# Patient Record
Sex: Male | Born: 1950 | Race: White | Hispanic: No | Marital: Married | Smoking: Current every day smoker
Health system: Southern US, Community
[De-identification: ages and names within clinical notes are randomized; demographics above are authoritative.]

## PROBLEM LIST (undated history)

## (undated) DIAGNOSIS — Z9289 Personal history of other medical treatment: Secondary | ICD-10-CM

## (undated) DIAGNOSIS — I309 Acute pericarditis, unspecified: Secondary | ICD-10-CM

## (undated) DIAGNOSIS — I2699 Other pulmonary embolism without acute cor pulmonale: Secondary | ICD-10-CM

## (undated) DIAGNOSIS — I200 Unstable angina: Secondary | ICD-10-CM

## (undated) DIAGNOSIS — I219 Acute myocardial infarction, unspecified: Secondary | ICD-10-CM

## (undated) DIAGNOSIS — M549 Dorsalgia, unspecified: Secondary | ICD-10-CM

## (undated) DIAGNOSIS — Z720 Tobacco use: Secondary | ICD-10-CM

## (undated) DIAGNOSIS — F32A Depression, unspecified: Secondary | ICD-10-CM

## (undated) DIAGNOSIS — F329 Major depressive disorder, single episode, unspecified: Secondary | ICD-10-CM

## (undated) DIAGNOSIS — R0602 Shortness of breath: Secondary | ICD-10-CM

## (undated) DIAGNOSIS — I10 Essential (primary) hypertension: Secondary | ICD-10-CM

## (undated) DIAGNOSIS — J449 Chronic obstructive pulmonary disease, unspecified: Secondary | ICD-10-CM

## (undated) DIAGNOSIS — E785 Hyperlipidemia, unspecified: Secondary | ICD-10-CM

## (undated) DIAGNOSIS — I2510 Atherosclerotic heart disease of native coronary artery without angina pectoris: Secondary | ICD-10-CM

## (undated) DIAGNOSIS — E119 Type 2 diabetes mellitus without complications: Secondary | ICD-10-CM

## (undated) HISTORY — DX: Other pulmonary embolism without acute cor pulmonale: I26.99

## (undated) HISTORY — PX: CARDIAC CATHETERIZATION: SHX172

## (undated) HISTORY — DX: Personal history of other medical treatment: Z92.89

## (undated) HISTORY — DX: Unstable angina: I20.0

## (undated) HISTORY — PX: CHOLECYSTECTOMY: SHX55

## (undated) HISTORY — DX: Acute pericarditis, unspecified: I30.9

---

## 1982-08-29 HISTORY — PX: KNEE SURGERY: SHX244

## 1998-03-26 ENCOUNTER — Ambulatory Visit (HOSPITAL_BASED_OUTPATIENT_CLINIC_OR_DEPARTMENT_OTHER): Admission: RE | Admit: 1998-03-26 | Discharge: 1998-03-26 | Payer: Self-pay | Admitting: Orthopedic Surgery

## 1998-04-22 ENCOUNTER — Encounter: Admission: RE | Admit: 1998-04-22 | Discharge: 1998-05-19 | Payer: Self-pay | Admitting: Anesthesiology

## 1998-04-27 ENCOUNTER — Ambulatory Visit (HOSPITAL_COMMUNITY): Admission: RE | Admit: 1998-04-27 | Discharge: 1998-04-27 | Payer: Self-pay | Admitting: Orthopedic Surgery

## 1998-08-13 ENCOUNTER — Encounter: Payer: Self-pay | Admitting: Cardiology

## 1998-08-13 ENCOUNTER — Ambulatory Visit (HOSPITAL_COMMUNITY): Admission: RE | Admit: 1998-08-13 | Discharge: 1998-08-13 | Payer: Self-pay | Admitting: Cardiology

## 1998-08-19 ENCOUNTER — Observation Stay (HOSPITAL_COMMUNITY): Admission: AD | Admit: 1998-08-19 | Discharge: 1998-08-20 | Payer: Self-pay | Admitting: Cardiology

## 1998-10-12 ENCOUNTER — Observation Stay (HOSPITAL_COMMUNITY): Admission: AD | Admit: 1998-10-12 | Discharge: 1998-10-13 | Payer: Self-pay | Admitting: Cardiology

## 1999-01-12 ENCOUNTER — Ambulatory Visit (HOSPITAL_BASED_OUTPATIENT_CLINIC_OR_DEPARTMENT_OTHER): Admission: RE | Admit: 1999-01-12 | Discharge: 1999-01-12 | Payer: Self-pay | Admitting: Orthopedic Surgery

## 1999-01-19 ENCOUNTER — Ambulatory Visit (HOSPITAL_COMMUNITY): Admission: RE | Admit: 1999-01-19 | Discharge: 1999-01-19 | Payer: Self-pay | Admitting: Cardiology

## 1999-01-22 ENCOUNTER — Ambulatory Visit: Admission: RE | Admit: 1999-01-22 | Discharge: 1999-01-22 | Payer: Self-pay | Admitting: Orthopedic Surgery

## 1999-02-09 ENCOUNTER — Encounter: Payer: Self-pay | Admitting: Pulmonary Disease

## 1999-02-17 ENCOUNTER — Encounter: Payer: Self-pay | Admitting: Pulmonary Disease

## 1999-02-17 ENCOUNTER — Ambulatory Visit: Admission: RE | Admit: 1999-02-17 | Discharge: 1999-02-17 | Payer: Self-pay | Admitting: Pulmonary Disease

## 1999-07-09 ENCOUNTER — Encounter: Admission: RE | Admit: 1999-07-09 | Discharge: 1999-07-09 | Payer: Self-pay | Admitting: Cardiology

## 1999-07-09 ENCOUNTER — Encounter: Payer: Self-pay | Admitting: Cardiology

## 1999-07-16 ENCOUNTER — Ambulatory Visit (HOSPITAL_COMMUNITY): Admission: RE | Admit: 1999-07-16 | Discharge: 1999-07-17 | Payer: Self-pay | Admitting: Cardiology

## 1999-08-30 HISTORY — PX: CORONARY ANGIOPLASTY: SHX604

## 2000-08-07 ENCOUNTER — Inpatient Hospital Stay (HOSPITAL_COMMUNITY): Admission: AD | Admit: 2000-08-07 | Discharge: 2000-08-09 | Payer: Self-pay | Admitting: Cardiology

## 2000-08-07 ENCOUNTER — Encounter: Payer: Self-pay | Admitting: Cardiology

## 2000-09-19 ENCOUNTER — Inpatient Hospital Stay (HOSPITAL_COMMUNITY): Admission: RE | Admit: 2000-09-19 | Discharge: 2000-09-20 | Payer: Self-pay | Admitting: Urology

## 2001-09-05 ENCOUNTER — Encounter: Payer: Self-pay | Admitting: Pulmonary Disease

## 2001-12-19 ENCOUNTER — Encounter: Payer: Self-pay | Admitting: Gastroenterology

## 2002-08-29 HISTORY — PX: PERCUTANEOUS CORONARY STENT INTERVENTION (PCI-S): SHX6016

## 2003-06-24 ENCOUNTER — Ambulatory Visit (HOSPITAL_COMMUNITY): Admission: RE | Admit: 2003-06-24 | Discharge: 2003-06-25 | Payer: Self-pay | Admitting: Cardiology

## 2004-09-01 ENCOUNTER — Ambulatory Visit: Payer: Self-pay | Admitting: Family Medicine

## 2004-09-03 ENCOUNTER — Ambulatory Visit: Payer: Self-pay | Admitting: Family Medicine

## 2005-05-03 ENCOUNTER — Ambulatory Visit: Payer: Self-pay | Admitting: Family Medicine

## 2005-05-16 ENCOUNTER — Ambulatory Visit: Payer: Self-pay | Admitting: Family Medicine

## 2005-07-07 ENCOUNTER — Ambulatory Visit: Payer: Self-pay | Admitting: Family Medicine

## 2008-02-08 DIAGNOSIS — M1990 Unspecified osteoarthritis, unspecified site: Secondary | ICD-10-CM | POA: Insufficient documentation

## 2008-02-08 DIAGNOSIS — G43909 Migraine, unspecified, not intractable, without status migrainosus: Secondary | ICD-10-CM | POA: Insufficient documentation

## 2008-02-08 DIAGNOSIS — Z860100 Personal history of colon polyps, unspecified: Secondary | ICD-10-CM | POA: Insufficient documentation

## 2008-02-08 DIAGNOSIS — M549 Dorsalgia, unspecified: Secondary | ICD-10-CM

## 2008-02-08 DIAGNOSIS — E785 Hyperlipidemia, unspecified: Secondary | ICD-10-CM

## 2008-02-08 DIAGNOSIS — I10 Essential (primary) hypertension: Secondary | ICD-10-CM | POA: Insufficient documentation

## 2008-02-08 HISTORY — DX: Dorsalgia, unspecified: M54.9

## 2008-02-12 ENCOUNTER — Ambulatory Visit: Payer: Self-pay | Admitting: Gastroenterology

## 2008-02-12 DIAGNOSIS — Z794 Long term (current) use of insulin: Secondary | ICD-10-CM

## 2008-02-12 DIAGNOSIS — R1013 Epigastric pain: Secondary | ICD-10-CM

## 2008-02-12 DIAGNOSIS — E119 Type 2 diabetes mellitus without complications: Secondary | ICD-10-CM

## 2008-02-13 LAB — CONVERTED CEMR LAB
ALT: 78 units/L — ABNORMAL HIGH (ref 0–53)
AST: 63 units/L — ABNORMAL HIGH (ref 0–37)
Albumin: 3.5 g/dL (ref 3.5–5.2)
Alkaline Phosphatase: 115 units/L (ref 39–117)
Amylase: 32 units/L (ref 27–131)
Basophils Absolute: 0 10*3/uL (ref 0.0–0.1)
Basophils Relative: 0.5 % (ref 0.0–1.0)
Bilirubin, Direct: 0.1 mg/dL (ref 0.0–0.3)
Eosinophils Absolute: 0.1 10*3/uL (ref 0.0–0.7)
Eosinophils Relative: 2 % (ref 0.0–5.0)
HCT: 45.1 % (ref 39.0–52.0)
Hemoglobin: 15 g/dL (ref 13.0–17.0)
Lipase: 19 units/L (ref 11.0–59.0)
Lymphocytes Relative: 21.6 % (ref 12.0–46.0)
MCHC: 33.3 g/dL (ref 30.0–36.0)
MCV: 101.5 fL — ABNORMAL HIGH (ref 78.0–100.0)
Monocytes Absolute: 0.6 10*3/uL (ref 0.1–1.0)
Monocytes Relative: 9.1 % (ref 3.0–12.0)
Neutro Abs: 4.1 10*3/uL (ref 1.4–7.7)
Neutrophils Relative %: 66.8 % (ref 43.0–77.0)
Platelets: 156 10*3/uL (ref 150–400)
RBC: 4.44 M/uL (ref 4.22–5.81)
RDW: 13 % (ref 11.5–14.6)
Sed Rate: 16 mm/hr (ref 0–16)
Total Bilirubin: 0.5 mg/dL (ref 0.3–1.2)
Total Protein: 6.6 g/dL (ref 6.0–8.3)
WBC: 6.1 10*3/uL (ref 4.5–10.5)

## 2008-02-14 ENCOUNTER — Ambulatory Visit (HOSPITAL_COMMUNITY): Admission: RE | Admit: 2008-02-14 | Discharge: 2008-02-14 | Payer: Self-pay | Admitting: Gastroenterology

## 2008-02-20 ENCOUNTER — Ambulatory Visit: Payer: Self-pay | Admitting: Gastroenterology

## 2008-02-20 ENCOUNTER — Encounter: Payer: Self-pay | Admitting: Gastroenterology

## 2008-02-20 ENCOUNTER — Telehealth: Payer: Self-pay | Admitting: Gastroenterology

## 2008-02-20 ENCOUNTER — Encounter (INDEPENDENT_AMBULATORY_CARE_PROVIDER_SITE_OTHER): Payer: Self-pay | Admitting: *Deleted

## 2008-02-20 LAB — CONVERTED CEMR LAB
Ferritin: 389.6 ng/mL — ABNORMAL HIGH (ref 22.0–322.0)
Folate: 6.6 ng/mL
HCV Ab: NEGATIVE
Iron: 154 ug/dL (ref 42–165)
Saturation Ratios: 40.2 % (ref 20.0–50.0)
Transferrin: 273.6 mg/dL (ref >=212.0)
Vitamin B-12: 418 pg/mL (ref 211–911)

## 2008-03-10 ENCOUNTER — Encounter: Payer: Self-pay | Admitting: Gastroenterology

## 2008-03-18 ENCOUNTER — Ambulatory Visit: Payer: Self-pay | Admitting: Gastroenterology

## 2008-03-18 DIAGNOSIS — K7689 Other specified diseases of liver: Secondary | ICD-10-CM

## 2008-03-18 DIAGNOSIS — K219 Gastro-esophageal reflux disease without esophagitis: Secondary | ICD-10-CM | POA: Insufficient documentation

## 2008-04-02 ENCOUNTER — Telehealth: Payer: Self-pay | Admitting: Gastroenterology

## 2008-04-09 ENCOUNTER — Encounter (INDEPENDENT_AMBULATORY_CARE_PROVIDER_SITE_OTHER): Payer: Self-pay | Admitting: *Deleted

## 2008-04-29 ENCOUNTER — Telehealth: Payer: Self-pay | Admitting: Gastroenterology

## 2008-05-22 ENCOUNTER — Ambulatory Visit: Payer: Self-pay | Admitting: Gastroenterology

## 2008-05-22 LAB — CONVERTED CEMR LAB
ALT: 47 units/L (ref 0–53)
AST: 47 units/L — ABNORMAL HIGH (ref 0–37)
Albumin: 3.9 g/dL (ref 3.5–5.2)
Alkaline Phosphatase: 101 units/L (ref 39–117)
Bilirubin, Direct: 0.2 mg/dL (ref 0.0–0.3)
Total Bilirubin: 0.8 mg/dL (ref 0.3–1.2)
Total Protein: 7.1 g/dL (ref 6.0–8.3)

## 2008-05-29 ENCOUNTER — Telehealth: Payer: Self-pay | Admitting: Gastroenterology

## 2008-06-18 ENCOUNTER — Ambulatory Visit: Payer: Self-pay | Admitting: Gastroenterology

## 2008-07-08 ENCOUNTER — Telehealth: Payer: Self-pay | Admitting: Gastroenterology

## 2009-01-20 ENCOUNTER — Ambulatory Visit: Payer: Self-pay | Admitting: Gastroenterology

## 2009-01-20 ENCOUNTER — Encounter: Payer: Self-pay | Admitting: Gastroenterology

## 2009-01-20 DIAGNOSIS — K625 Hemorrhage of anus and rectum: Secondary | ICD-10-CM

## 2009-01-20 DIAGNOSIS — K644 Residual hemorrhoidal skin tags: Secondary | ICD-10-CM | POA: Insufficient documentation

## 2009-01-21 LAB — CONVERTED CEMR LAB
Albumin: 3.9 g/dL (ref 3.5–5.2)
Alkaline Phosphatase: 140 units/L — ABNORMAL HIGH (ref 39–117)

## 2009-01-29 ENCOUNTER — Ambulatory Visit (HOSPITAL_COMMUNITY): Admission: RE | Admit: 2009-01-29 | Discharge: 2009-01-29 | Payer: Self-pay | Admitting: Gastroenterology

## 2009-01-29 ENCOUNTER — Ambulatory Visit: Payer: Self-pay | Admitting: Gastroenterology

## 2009-01-29 ENCOUNTER — Encounter: Payer: Self-pay | Admitting: Gastroenterology

## 2009-01-30 ENCOUNTER — Encounter: Payer: Self-pay | Admitting: Gastroenterology

## 2009-08-06 ENCOUNTER — Ambulatory Visit: Payer: Self-pay | Admitting: Pulmonary Disease

## 2009-08-06 DIAGNOSIS — R0989 Other specified symptoms and signs involving the circulatory and respiratory systems: Secondary | ICD-10-CM

## 2009-08-06 DIAGNOSIS — J438 Other emphysema: Secondary | ICD-10-CM | POA: Insufficient documentation

## 2009-08-06 DIAGNOSIS — R0609 Other forms of dyspnea: Secondary | ICD-10-CM

## 2009-08-14 ENCOUNTER — Telehealth (INDEPENDENT_AMBULATORY_CARE_PROVIDER_SITE_OTHER): Payer: Self-pay | Admitting: *Deleted

## 2009-09-01 ENCOUNTER — Encounter: Payer: Self-pay | Admitting: Pulmonary Disease

## 2009-09-03 ENCOUNTER — Ambulatory Visit: Payer: Self-pay | Admitting: Pulmonary Disease

## 2009-09-28 ENCOUNTER — Encounter: Payer: Self-pay | Admitting: Pulmonary Disease

## 2010-05-05 ENCOUNTER — Encounter (INDEPENDENT_AMBULATORY_CARE_PROVIDER_SITE_OTHER): Payer: Self-pay | Admitting: *Deleted

## 2010-09-28 NOTE — Letter (Signed)
Summary: [facility]  [facility]   Imported By: Sherian Rein 09/15/2009 13:53:23  _____________________________________________________________________  External Attachment:    Type:   Image     Comment:   External Document

## 2010-09-28 NOTE — Miscellaneous (Signed)
Summary: Orders Update pft charges  Clinical Lists Changes  Orders: Added new Service order of Carbon Monoxide diffusing w/capacity (94720) - Signed Added new Service order of Lung Volumes (94240) - Signed Added new Service order of Spirometry (Pre & Post) (94060) - Signed 

## 2010-09-28 NOTE — Assessment & Plan Note (Signed)
Summary: rov for review of pfts.   Copy to:  Daphene Jaeger ([facility]) Primary Provider/Referring Provider:  Molly Maduro Dough,MD  CC:  Pt is here for a f/u appt to discuss PFT results.  Pt states he is still smoking approx 1/4 ppd.  Pt states he hasn't noticed much change in his breating since starting on Symbicort.  Pt denied a cough.  .  History of Present Illness: The pt comes in today for full pfts and to discuss results.  He has mild airflow obstruction primarily manifested by airtrapping, no response to bronchodilators, and mild decrease in dlco which corrects to normal with alveolar volume adjustment.  I have reviewed the study in depth with him, and answered all of his questions.  Current Medications (verified): 1)  Morphine Pump Implant 2)  Oxycontin 40 Mg  Tb12 (Oxycodone Hcl) .... Three Times A Day 3)  Celexa 40 Mg Tabs (Citalopram Hydrobromide) .Marland Kitchen.. 1 By Mouth Once Daily 4)  Lisinopril-Hydrochlorothiazide 20-12.5 Mg Tabs (Lisinopril-Hydrochlorothiazide) .... Take 1 Tablet By Mouth Once A Day 5)  Baby Aspirin 81 Mg  Chew (Aspirin) .... Once Daily 6)  Flexeril 10 Mg Tabs (Cyclobenzaprine Hcl) .Marland Kitchen.. 1 By Mouth Once Daily 7)  Zetia 10 Mg Tabs (Ezetimibe) .... Take 1 Tablet By Mouth Once A Day 8)  Glimepiride 4 Mg Tabs (Glimepiride) .... Take 1 1/2 Tabs ( Total of 6mg ) By Mouth Once Daily 9)  Insulin .... Take As Directed 10)  Spiriva Handihaler 18 Mcg  Caps (Tiotropium Bromide Monohydrate) .... One Puff in Handihaler Daily 11)  Symbicort 160-4.5 Mcg/act  Aero (Budesonide-Formoterol Fumarate) .... Two Puffs Twice Daily 12)  Proair Hfa 108 (90 Base) Mcg/act  Aers (Albuterol Sulfate) .... 2 Puffs Every 4-6 Hours As Needed 13)  Nitrostat 0.4 Mg Subl (Nitroglycerin) .... As Needed 14)  Metoprolol Succinate 25 Mg Xr24h-Tab (Metoprolol Succinate) .... Take 1 Tablet By Mouth Once A Day 15)  Imdur 30 Mg Xr24h-Tab (Isosorbide Mononitrate) .... Take 1 Tablet By Mouth Once A  Day  Allergies (verified): No Known Drug Allergies  Vital Signs:  Patient profile:   60 year old male Height:      68 inches Weight:      237 pounds BMI:     36.17 O2 Sat:      90 % on Room air Temp:     97.5 degrees F oral Pulse rate:   82 / minute BP sitting:   114 / 68  (right arm) Cuff size:   regular  Vitals Entered By: Arman Filter LPN (September 03, 2009 11:12 AM)  O2 Flow:  Room air CC: Pt is here for a f/u appt to discuss PFT results.  Pt states he is still smoking approx 1/4 ppd.  Pt states he hasn't noticed much change in his breating since starting on Symbicort.  Pt denied a cough.   Comments Medications reviewed with patient Arman Filter LPN  September 03, 2009 11:12 AM    Physical Exam  General:  obese male in nad   Impression & Recommendations:  Problem # 1:  EMPHYSEMA (ICD-492.8) the pt has mild disease at best by pfts.  I have explained to him the most appropriate treatment for this is total smoking cessation, along with as needed albuterol.  I have asked him to d/c the spiriva and symbicort, but can restart the symbicort if he is unable to stop smoking and begins to have worsening symptoms.  Problem # 2:  DYSPNEA ON  EXERTION (ICD-786.09) The pt's symptoms are out of proportion to his pft findings.  There is no queston his obesity and deconditioning are major contributors, but I cannot exclude a cardiac component or VTE disease.  He is currently scheduled to have a cardiac w/u with Dr. Tresa Endo, and I have asked him to contact me if this is unrevealing so that we can schedule v/q for completeness.  Time spent with pt today discussing the above was  Medications Added to Medication List This Visit: 1)  Nitrostat 0.4 Mg Subl (Nitroglycerin) .... As needed 2)  Metoprolol Succinate 25 Mg Xr24h-tab (Metoprolol succinate) .... Take 1 tablet by mouth once a day 3)  Imdur 30 Mg Xr24h-tab (Isosorbide mononitrate) .... Take 1 tablet by mouth once a day  Other  Orders: Est. Patient Level III (04540)  Patient Instructions: 1)  would stop spiriva and symbicort 2)  stay on albuterol inhaler 2 puffs every 6hrs if needed. 3)  the most important thing you can do is to quit smoking. 4)  If Dr. Tresa Endo doesn't find anything wrong with your heart, you need to let me know so that we can set up lung scan to exclude the possibility of blood clots.   Prescriptions: PROAIR HFA 108 (90 BASE) MCG/ACT  AERS (ALBUTEROL SULFATE) 2 puffs every 4-6 hours as needed  #3 x 0   Entered and Authorized by:   Barbaraann Share MD   Signed by:   Barbaraann Share MD on 09/03/2009   Method used:   Electronically to        [facility] [phone]* (retail)       [address]       [place], [place]  [address]       Ph: [phone] or [phone]       Fax: [phone]   RxID:   [id number]

## 2010-09-28 NOTE — Letter (Signed)
Summary: Colonoscopy Date Change Letter  [facility]  [address] [place], [place] [address]   Phone: [phone]  Fax: [phone]      May 05, 2010 MRN: [id number]   Dylan Church [address] [place], [place]  [address]   Dear Mr. LEHRMANN,   Previously you were recommended to have a repeat colonoscopy around this time. Your chart was recently reviewed by Dr. Jarold Motto of [facility]. Follow up colonoscopy is now recommended in July 2013. This revised recommendation is based on current, nationally recognized guidelines for colorectal cancer screening and polyp surveillance. These guidelines are endorsed by the American Cancer Society, [employer] on Colorectal Cancer as well as numerous other major medical organizations.  Please understand that our recommendation assumes that you do not have any new symptoms such as bleeding, a change in bowel habits, anemia, or significant abdominal discomfort. If you do have any concerning GI symptoms or want to discuss the guideline recommendations, please call to arrange an office visit at your earliest convenience. Otherwise we will keep you in our reminder system and contact you 1-2 months prior to the date listed above to schedule your next colonoscopy.  Thank you,   Vania Rea. Jarold Motto, M.D.  [facility] [phone]

## 2010-12-06 LAB — GLUCOSE, CAPILLARY: Glucose-Capillary: 157 mg/dL — ABNORMAL HIGH (ref 70–99)

## 2011-01-14 NOTE — Op Note (Signed)
[facility]  Patient:    Dylan Church, Dylan Church                        MRN: [id number] Proc. Date: 09/19/00 Adm. Date:  [id number] Disc. Date: [id number] Attending:  Londell Moh CC:         [facility]   Operative Report  This is a redictation.  The date of the initial dictation was September 19, 2000.  PREOPERATIVE DIAGNOSIS:  Benign prostatic hypertrophy with bladder outlet obstruction.  POSTOPERATIVE DIAGNOSIS:  Benign prostatic hypertrophy with bladder outlet obstruction.  PROCEDURE:  Transurethral resection of the prostate.  SURGEON:  Jamison Neighbor, M.D.  ANESTHESIA:  General.  COMPLICATIONS:  None.  DRAINS:  None.  BRIEF HISTORY:  This 60 year old male has had problems with bladder outlet obstruction that has not responded to alpha blockade.  The patient was found to have some degree of bladder outlet obstruction on cystoscopy.  The patient underwent a cystoscopic examination to make sure there was no evidence of any injury to his urethra from previous placed penile prosthesis.  The patient was informed of treatment options, and it was felt that he might be a good candidate for the TUNA procedure; however, it was unclear what heat would do to the prosthesis, and it was felt that the best bet was a standard TURP.  The patient understands the risks and benefits of the procedure and gave full and informed consent.  DESCRIPTION OF PROCEDURE:  After successful induction of general anesthesia, the patient was placed in the dorsal lithotomy position, prepped with Betadine, and draped in the usual sterile fashion.  The urethra was dilated to 30 [place] with [employer] sound.  The Olympus continuous flow resectoscope sheath was then inserted using a Timberlake obturator.  The Stern-McCarthy resectoscope was inserted with the 12 degree lens and placed in the bladder which was carefully inspected.  It was free of any tumors or stones.   Both ureteral orifices were normal in configuration and location.  TURP was performed.  The median lobe was taken down, beginning at the bladder neck and extending out to the verumontanum.  This was followed by resection of the right lateral lobe, starting at the 11 oclock position, extending down to the floor of the prostate.  The left lateral lobe was done in identical fashion. All chips were irrigated from the bladder.  Adequate hemostasis was obtained. Careful inspection showed that the prostate fossa was now wide open.  There was a little phrenic residual of prostatic tissue.  This was confirmed on rectal examination.  The catheter was placed.  The smallest possible three way catheter was inserted to avoid any injury to the urethra secondary to the previously placed penile prosthesis and to lower the risk for erosion.  The patient had this placed to continuous bladder irrigation with normal saline. The patient tolerated the procedure well and was taken to the recovery room in good condition.  He will have the Foley catheter removed in 24 hours if the urine is clear in order to try to expedite his discharge and keep the catheter time as little as possible. DD:  09/26/00 TD:  09/26/00 Job: [id number]

## 2011-01-14 NOTE — Discharge Summary (Signed)
[place]  Patient:    Dylan Church, Dylan Church                        MRN: [id number] Adm. Date:  [id number] Disc. Date: [id number] Attending:  Ophelia Church Dictator:   Dylan Church. Dylan Church, R.N., N.P. CC:         Dylan Church, M.D. [facility]  Dylan Church, M.D.   Discharge Summary  HOSPITAL COURSE:  [name] is a 59 year old white married male patient with chest pain complaints for two weeks "like someone was sitting on his chest."  He was seen in the emergency room.  He has had several cardiac catheterizations in the past with several procedures, including PTCA and stent placement.  It was decided that he should be admitted, receive some IV medications, and rule out for an MI.  He was seen by Dr. Chanda Church in the emergency room.  He was put on IV heparin, nitroglycerin, and plans were for a cardiac catheterization in the morning.  On August 08, 2000, he underwent right brachial catheterization.  He apparently had severe disabling back disease thus a brachial cath was done.  He had a restenosed stent to his RCA and a patent left circumflex to his PTCA site.  He underwent PTCA of his RCA with cutting balloon.  He was given sedation secondary to his back problems and brachytherapy would be discussed with Dr. Jenne Church.  On August 09, 2000, the heavy pressure in his chest was gone.  He was seen by Dr. Elsie Church again. It was decided that he should be discharged home and brought back in as an outpatient for brachytherapy.  Niaspan was added to his medications for low HDL and he was discharged home.  Chest x-ray August 07, 2000 showed no active cardiopulmonary process.  LABORATORIES:  Admission wbc was 14.2, hemoglobin was 16.4, hematocrit 49.  On August 08, 2000, wbc was 12.8, hemoglobin was 16.5 hematocrit 49.8, and platelets were 252.  Sodium was 137, potassium 3.8, glucose 119, BUN 16, creatinine 0.9.  AST and ALP were within normal  limits.  CK-MBs: #1 - 176/4.7 with a troponin of less than 0.01.  #2 - 100/3.2 with a troponin of less than 0.01.  He had two more CK-MBs done on August 08, 2000 and these were both negative.  Total cholesterol was 188, triglycerides 96, his HDL was 33, his LDL was 136.  TSH was 2.114.  He had a positive protein in his urine 30 mg/dl. His EKG showed sinus brady.  DISCHARGE MEDICATIONS:  1. Enteric-coated aspirin 325 mg once a day.  2. Toprol XL 25 mg once per day.  3. Wellbutrin 150 mg once per day.  4. Colace 200 mg once per day.  5. Neurontin 300 mg three times per day.  6. Celebrex 200 mg twice per day.  7. Prozac 20 mg once per day.  8. Protonix 40 mg once per day.  9. Avapro 300 mg once per day. 10. Zocor 40 mg at bedtime every night. 11. Plavix 75 mg once per day x 1 month. 12. OxyContin as taken previously. 13. Nitroglycerin 1/150 one under tongue every five minutes x 3 when needed     for chest pain. 14. Niaspan 500 mg at bedtime.  ACTIVITY:  He should do no strenuous activity, no driving, no lifting for four days.  FOLLOWUP:  He will follow up with Dylan Church on August 28, 2000  at 10:10 a.m.  DISCHARGE DIAGNOSES:  1. Unstable angina.  2. Status post cardiac catheterization with restenosed stent to his right     coronary artery with subsequent cutting balloon procedure.     Catheterization done via brachial access secondary to severe disabling     back disease.  3. History of atherosclerotic cardiovascular disease August 19, 1998     percutaneous transluminal coronary angioplasty of mid circumflex 90% to 0.     October 12, 1998, percutaneous transluminal coronary angioplasty and     stent of the proximal mid right coronary artery, left circumflex     percutaneous transluminal coronary angioplasty was only 20% restenosis and     he had a 50% mid left anterior descending lesion.  July 16, 1999, he     had a percutaneous transluminal coronary angioplasty of  his right coronary     artery for in-stent restenosis, normal left ventricular function.  4. 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  5. Hypertension.  6. Hyperlipidemia with low high-density lipoprotein.  7. Migraine headaches.  8. History of multiple musculoskeletal surgeries. DD:  09/13/00 TD:  09/14/00 Job: [id number]

## 2011-01-14 NOTE — Cardiovascular Report (Signed)
Dylan Church, Dylan Church                           ACCOUNT NO.:  [id number]   MEDICAL RECORD NO.:  [id number]                   PATIENT TYPE:  OIB   LOCATION:  2852                                 FACILITY:  [facility]   PHYSICIAN:  Madaline Savage, M.D.             DATE OF BIRTH:  10-07-1950   DATE OF PROCEDURE:  06/24/2003  DATE OF DISCHARGE:                              CARDIAC CATHETERIZATION   PROCEDURES PERFORMED:  1. Selective coronary angiography by Judkins technique.  2. Retrograde left heart catheterization.  3. Left ventricular angiography.  4. Abdominal aortography.  5. Percutaneous coronary intervention of the distal left circumflex coronary     artery by Judkins technique.  6. Attempted right brachial catheterization unsuccessful due to ability to     adequately assess arterial sheath placement.  7. The patient received an AngioSeal closure of the right femoral artery     with good success.   COMPLICATIONS:  None.   ENTRY SITE:  1. Attempted right brachial, unsuccessful.  2. Successful catheterization and PCI from right percutaneous femoral     approach.   MEDICATIONS GIVEN:  Demerol, fentanyl, Angiomax, intracoronary  nitroglycerin, Pepcid, Plavix.   PATIENT PROFILE:  The patient is a 60 year old white married male tobacco  smoker who is disabled from his back.  He has had four previous  catheterizations between 1991 and 2001 all from the right percutaneous  brachial approach due to inability to lie flat on his back for very long.  He has had angioplasty to his circumflex in the past and stents were placed  to his right coronary artery.  His last procedure was a cutting balloon  angioplasty to the right coronary artery on August 08, 2000.   Recently, he has had chest pain of an anginal nature and he continues to  smoke.  Cardiolite stress test showed inferior wall reversible perfusion  abnormalities with left ventricular ejection fraction of 67%.  He was  brought to the catheterization laboratory today on an outpatient basis  electively.   RESULTS:  PRESSURES:  The left ventricular pressure was 115/15, end-  diastolic pressure 24.  Central aortic pressure 115/70, mean of 90.  No  aortic valve gradient by pullback technique.   ANGIOGRAPHIC RESULTS:  1. The right coronary artery showed overlapping stents to the proximal     portion of the vessel.  The stents were widely patent.  The right     coronary artery was a nondominant vessel.  There was catheter induced     spasms in the proximal portion of the vessel which was completely     rectified by the administration of intracoronary nitroglycerin 200 mcg     and switching from a non side hole 6-French catheter to a side hole     catheter.  I was convinced after those maneuvers that there was in     reality only catheter induced spasm.  No lesions were seen in the     proximal RCA and the two overlapping stents were widely patent with good     runoff into the small, nondominant vessel.  2. The left main coronary artery is extremely short and contained no     lesions.  3. The left anterior descending coronary artery gives rise to a septal     perforator branch, three diagonal branches, and wraps around the apex.     The is codominant with the circumflex as dominant vessels in this     circulation.  Only a 30% stenosis was noted near the origin of the first     diagonal branch.  That 30% lesion was in the LAD.  4. The circumflex was large and codominant with the LAD.  It gave rise to a     huge first obtuse marginal, a smaller second obtuse marginal, then a     posterolateral and a posterior descending branch.  There was a stenosis     in the distal circumflex just after the atrial circumflex branch and just     before the PLA.  It was 90% and it was a tight B1 lesion.  Abdominal     aortography showed normal abdominal aorta and normal renal arteries.     Common iliacs were also normal.  5.  Left ventricular angiography showed good contractility of all wall     segments with no evidence of old infarction, no mitral regurgitation, no     LV thrombus.  6. Abdominal aortography showed single arteries to both kidneys and both     renal arteries were normal.   PERCUTANEOUS CORONARY INTERVENTION:  This was performed via the right  percutaneous femoral approach using a 7-French introducer sheath, a 7-French  Voda 3.5 guide catheter, a short Patriot wire, a 2.5 x 15 Maverick balloon,  and ultimately a 3.0 x 16 mm Taxus stent.   The guide catheter backup was excellent.  The guidewire crossed the lesion  with ease.  The predilatation with the Maverick balloon allowed ultimate  passage of the stent with ease.  The 2.5 x 15 Maverick was inflated to about  12 atmospheres of pressure.  It required two inflations.  The Taxus stent  was then placed so as to avoid jailing the atrial circumflex which was a  large vessel and while also getting a good position distally in the stenotic  distal circumflex.  Two inflations were performed to 17 atmospheres.  After  deployment the 95% lesion was reduced to 0% residual with preservation of  TIMI class 3 flow.   I then performed a right femoral arteriogram showing good placement of the  sheath well above the bifurcation point and then ultimately an AngioSeal was  performed and even with the ACT initially in the case of 347 the AngioSeal  provided excellent hemostasis.  No complications occurred during this case.   FINAL DIAGNOSES:  1. DeNovo lesion in the circumflex with successful balloon angioplasty, then     stenting with reduction of a 95% initial lesion to 0% residual.  2. Patency of two overlapping right coronary stents.  3. Right coronary artery spasm, resolved with intracoronary nitroglycerin     and side hole catheter.  4. 30% lesion in mid left anterior descending. 5. Normal left ventricular systolic function.  6. Normal renal  arteries.    PLAN:  The patient needs to be again counseled about smoking cessation.  We  have done this  multiple times in the past, but will again formally ask the  nurse consultant at [facility] to see him before he goes home tomorrow.                                               Madaline Savage, M.D.    WHG/MEDQ  D:  06/24/2003  T:  06/24/2003  Job:  [id number]   cc:   Deidre Ala, M.D.  [address]  [place], [place] [address]  Fax: [phone]   Dina Rich  [address]  [place]  [place] [address]  Fax: [phone]   Cath Lab

## 2011-01-14 NOTE — Cardiovascular Report (Signed)
[place]. [facility]  Patient:    Dylan Church, Dylan Church                       MRN: [id number] Proc. Date: 08/08/00 Attending:  Madaline Savage, M.D. CC:         Cardiac Catheterization Laboratory   Cardiac Catheterization  PROCEDURES PERFORMED: 1. Selective coronary angiography by Judkins technique. 2. Cutting Balloon angioplasty of the mid right coronary artery for    in-stent re-stenosis.  COMPLICATIONS:  None.  ENTRY SITE:  Right brachial.  PATIENT PROFILE:  The patient is a 60 year old, gentleman, with severe back pain and disk disease, status post multiple surgical procedures for it, who also has coronary disease and has had circumflex angioplasty and right coronary artery stenting.  The current procedure is performed due to recent chest pain episodes.  RESULTS:  The left main coronary artery is very short.  The circumflex is very dominant giving rise to one tiny proximal obtuse marginal branch, a much larger second obtuse marginal branch and distally the circumflex gives rise to a posterior descending branch and an atrial circumflex branch.  There are luminal irregularities in an old angioplasty site in the mid circumflex but no high-grade lesions.  The LAD contains luminal irregularities throughout its course but no significant lesions.  The right coronary artery contains a radiopaque stent in the midportion of the vessel.  There appears to be some spasm that is catheter-induced in the proximal portion of the vessel.  There is an in-stent re-stenosis approaching 60-75% inside the stent in the mid RCA very near the origin of acute marginal branch.  INTERVENTIONAL PROCEDURE:  This was performed with a 6 French hockey-stick catheter, a Patriot wire and a 2.75 x 15 mm Cutting Balloon.  A total of 5 inflations were performed up to 60 seconds each time.  The ultimate result showed that the 60-75% stenosis in the mid RCA within the stent was  reduced to 0% residual and there was TIMI class 3 distal flow at the end.  No complications occurred.  FINAL DIAGNOSES: 1. Patency of the mid circumflex at a previous angioplasty site. 2. In-stent re-stenosis of the mid right coronary artery stent. 3. Successful Cutting Balloon angioplasty of the in-stent re-stenosis of    75% in the mid right coronary artery with reduction of that lesion    to 0% residual. DD:  01/30/01 TD:  01/30/01 Job: [id number]

## 2011-01-14 NOTE — Discharge Summary (Signed)
Dylan Church, Dylan Church                           ACCOUNT NO.:  [id number]   MEDICAL RECORD NO.:  [id number]                   PATIENT TYPE:  OIB   LOCATION:  6531                                 FACILITY:  [facility]   PHYSICIAN:  Dylan Church, M.D.             DATE OF BIRTH:  [birth year]   DATE OF ADMISSION:  06/24/2003  DATE OF DISCHARGE:  06/25/2003                                 DISCHARGE SUMMARY   ADMISSION DIAGNOSES:  1. Unstable angina.  2. Status post Cardiolite scan, June 03, 2003, revealing inferolateral     scar with superimposed ischemia as well as mid distal inferior wall to     inferior apical segment.  There did appear to be more additional inferior     hypoperfusion with associated ischemia on the study June 03, 2003,     compared to December 2002.  3. Known history of coronary artery disease (CAD).     a. History of remote percutaneous transluminal coronary angioplasty        (PTCA) to the circumflex, 1999.     b. Status post percutaneous transluminal coronary angioplasty stenting to        the proximal and mid right coronary artery (RCA) in 2000.     c. Status post repeat dilatation for restenosis to the right coronary        artery also in 2000.     d. Status post cutting balloon percutaneous transluminal coronary        angioplasty for restenosis of the right coronary artery in December        2001.  4. Hypertension.  5. Hyperlipidemia.  6. Family history of coronary artery disease (CAD).  7. Ongoing tobacco use.  8. Medication noncompliance.   DISCHARGE DIAGNOSES:  1. Unstable angina.  2. Status post Cardiolite scan, June 03, 2003, revealing inferolateral     scar with superimposed ischemia as well as mid distal inferior wall to     inferior apical segment.  There did appear to be more additional inferior     hypoperfusion with associated ischemia on the study June 03, 2003,     compared to December 2002.  3. Known history of coronary artery  disease (CAD).     a. History of remote percutaneous transluminal coronary angioplasty        (PTCA) to the circumflex, 1999.     b. Status post percutaneous transluminal coronary angioplasty stenting to        the proximal and mid right coronary artery (RCA) in 2000.     c. Status post repeat dilatation for restenosis to the right coronary        artery also in 2000.     d. Status post cutting balloon percutaneous transluminal coronary        angioplasty for restenosis of the right coronary artery in December        2001.  4. Hypertension.  5. Hyperlipidemia.  6. Family history of coronary artery disease (CAD).  7. Ongoing tobacco use.  8. Medication noncompliance.  9. Status post cardiac catheterization, June 24, 2003, by Dr. Chanda Church.  He performed percutaneous transluminal coronary angioplasty     (PTCA) stent to the mid distal circumflex going from 95% to 0% residual     and he used Taxus stenting.   HISTORY OF PRESENT ILLNESS:  [name] is a 60 year old male who presented to  office evaluation on May 27, 2003, with a chief complaint of chest  pain.  HE reported that over the past weekend he had an episode of chest  pain responsive to nitroglycerine.  He had more on Saturday that was also  responsive to nitroglycerine but since then had not had any recurrence.   It was also noted that the patient had quit taking his medications since the  time that the last prescription had expired.  He did not have the pharmacy  call us and he did not call our office himself to have any new prescriptions  renewed.  Therefore, he had been off of statin, beta blocker, Avapro, and  several other medications.   At the time of the office visit, we reviewed the necessity for him being on  proper medications.  As well, we scheduled him for a Cardiolite scan.   He underwent Persantine Cardiolite on June 03, 2003.  During the chest, he  developed chest tightness with bilateral  arm discomfort and scintigraphic  evidence for probable nontransmural inferior and inferolateral scar.  There  was definite superimposed ischemia both of the inferior to inferolateral  base segment as well as in the mid distal inferior wall to inferoapical  segment.  In comparison to the study done December 2002, there did appear to  be more additional inferior hypoperfusion associated with ischemia on the  present study.   Dr. Elsie Church subsequently reviewed the findings and felt that cardiac  catheterization was necessary.  The risks and benefits of the procedure were  discussed with the patient and he was willing to proceed.   HOSPITAL COURSE:  On June 24, 2003, [name] underwent cardiac  catheterization by Dr. Chanda Church.  Please see the dictated report for  details.  The patient was found to have a 95% stenosis in the mid distal  circumflex.  He proceeded with PTCA stent using Taxus stent and the stenosis  went from 95% to 0% residual.  The patient tolerated the procedure well and  had no complications.   On June 25, 2003, [name] is doing well.  He is having no complaints and  has no chest pain, no groin problems.  At this point, he is afebrile at  97.5, pulse 60, blood pressure 114/64.  Right groin site is well healed with  no active bleeding, no hematoma, or bruit.  He has 3+ left posterior tibial  pulse.  Labs are stable postprocedure including a creatinine of 0.9 and a  hemoglobin of 15.2 post catheterization procedure.  Enzymes are slightly  elevated but not clinically significant for MI.  Telemetry and EKG show no  ischemic changes, no arrhythmia.   At this point, he is seen today by Dr. Chanda Church who deems him stable  for discharge home.  He has been seen by smoking cessation consultation  prior to discharge as well.   HOSPITAL CONSULTS:  Smoking cessation consultation.  HOSPITAL PROCEDURES:  Cardiac catheterization on  June 24, 2003, by Dr.  Chanda Church.  See the dictated report for details.  He was found to have  a mid distal left circumflex lesion of 95%.  Dr. Elsie Church proceeded with  subsequent PTCA stent using Taxus stent.  The patient tolerated the  procedure well without complications.  See the report for other details.   LABORATORY DATA:  Pre-procedure labs were checked on an outpatient basis and  were all stable.  Postprocedure labs on June 25, 2003, show a sodium of  138, potassium 3.8, BUN 10, creatinine 0.9, glucose 87, white count 8.4,  hemoglobin 15.2, hematocrit 47, and platelets 208,000.  CK is 144 with an MB  of 7.8 which Dr. Elsie Church did review and felt that it was trace positive but  was not clinically meaningful.   EKG showed sinus bradycardia, no ischemia.   RADIOLOGY:  Chest x-ray not present at this time.    DISCHARGE MEDICATIONS:  1. Plavix 75 mg once a day.  2. Aspirin 81 mg once a day.  3. Stop your Imdur.  4. Restart your Avalide as you were taking it before you stopped it.  5. Restart your Zocor as you were before you stopped it.  6. Stay off of your Toprol secondary to bradycardia.  7. AndroGel twice a day.  8. Tramadol 50 mg six a day.  9. Roxicodone 5 mg three times a day.  10.      Celebrex 200 mg twice a day.  11.      Neurontin 300 mg three times a day.  12.      Celexa 20 mg once a day.  13.      OxyContin 80 mg three times a day.  14.      Nitroglycerine 0.4 mg sublingual as directed for chest pain.   DISCHARGE INSTRUCTIONS:  1. No strenuous activity, lifting greater than 5 pounds, driving or sexual     activity for three days.  2. Low-cholesterol, low-fat diet.  3. May gently wash your groins up with warm water and soap.  4. Call [phone] for any bleeding or increased pain at the groin site.   FOLLOWUP:  I will get him an appointment to see Dr. Elsie Church back in the  office in about two to three weeks' time.      Mary B. Easley, P.A.-C.                   Dylan Church,  M.D.    MBE/MEDQ  D:  06/25/2003  T:  06/25/2003  Job:  [phone]

## 2011-12-12 ENCOUNTER — Encounter: Payer: Self-pay | Admitting: Gastroenterology

## 2012-11-27 DIAGNOSIS — I309 Acute pericarditis, unspecified: Secondary | ICD-10-CM

## 2012-11-27 DIAGNOSIS — I200 Unstable angina: Secondary | ICD-10-CM

## 2012-11-27 HISTORY — DX: Unstable angina: I20.0

## 2012-11-27 HISTORY — DX: Acute pericarditis, unspecified: I30.9

## 2012-11-28 ENCOUNTER — Encounter (HOSPITAL_COMMUNITY): Payer: Self-pay | Admitting: Internal Medicine

## 2012-11-28 ENCOUNTER — Inpatient Hospital Stay (HOSPITAL_COMMUNITY)
Admission: AD | Admit: 2012-11-28 | Discharge: 2012-12-07 | DRG: 236 | Disposition: A | Discharge status: Home / Self Care | Payer: Non-veteran care | Source: Other Acute Inpatient Hospital | Attending: Cardiothoracic Surgery | Admitting: Cardiothoracic Surgery

## 2012-11-28 DIAGNOSIS — I10 Essential (primary) hypertension: Secondary | ICD-10-CM | POA: Diagnosis present

## 2012-11-28 DIAGNOSIS — M1990 Unspecified osteoarthritis, unspecified site: Secondary | ICD-10-CM | POA: Diagnosis present

## 2012-11-28 DIAGNOSIS — Z77098 Contact with and (suspected) exposure to other hazardous, chiefly nonmedicinal, chemicals: Secondary | ICD-10-CM | POA: Diagnosis present

## 2012-11-28 DIAGNOSIS — Z860100 Personal history of colon polyps, unspecified: Secondary | ICD-10-CM

## 2012-11-28 DIAGNOSIS — E875 Hyperkalemia: Secondary | ICD-10-CM | POA: Diagnosis not present

## 2012-11-28 DIAGNOSIS — E8779 Other fluid overload: Secondary | ICD-10-CM | POA: Diagnosis not present

## 2012-11-28 DIAGNOSIS — I252 Old myocardial infarction: Secondary | ICD-10-CM

## 2012-11-28 DIAGNOSIS — G8929 Other chronic pain: Secondary | ICD-10-CM | POA: Diagnosis present

## 2012-11-28 DIAGNOSIS — K219 Gastro-esophageal reflux disease without esophagitis: Secondary | ICD-10-CM | POA: Diagnosis present

## 2012-11-28 DIAGNOSIS — T82897A Other specified complication of cardiac prosthetic devices, implants and grafts, initial encounter: Secondary | ICD-10-CM | POA: Diagnosis present

## 2012-11-28 DIAGNOSIS — I498 Other specified cardiac arrhythmias: Secondary | ICD-10-CM | POA: Diagnosis not present

## 2012-11-28 DIAGNOSIS — I200 Unstable angina: Secondary | ICD-10-CM | POA: Diagnosis present

## 2012-11-28 DIAGNOSIS — J438 Other emphysema: Secondary | ICD-10-CM | POA: Diagnosis present

## 2012-11-28 DIAGNOSIS — Z951 Presence of aortocoronary bypass graft: Secondary | ICD-10-CM

## 2012-11-28 DIAGNOSIS — M549 Dorsalgia, unspecified: Secondary | ICD-10-CM | POA: Diagnosis present

## 2012-11-28 DIAGNOSIS — F17200 Nicotine dependence, unspecified, uncomplicated: Secondary | ICD-10-CM | POA: Diagnosis present

## 2012-11-28 DIAGNOSIS — E785 Hyperlipidemia, unspecified: Secondary | ICD-10-CM | POA: Diagnosis present

## 2012-11-28 DIAGNOSIS — K5900 Constipation, unspecified: Secondary | ICD-10-CM | POA: Diagnosis not present

## 2012-11-28 DIAGNOSIS — Y831 Surgical operation with implant of artificial internal device as the cause of abnormal reaction of the patient, or of later complication, without mention of misadventure at the time of the procedure: Secondary | ICD-10-CM | POA: Diagnosis present

## 2012-11-28 DIAGNOSIS — Z9861 Coronary angioplasty status: Secondary | ICD-10-CM

## 2012-11-28 DIAGNOSIS — K644 Residual hemorrhoidal skin tags: Secondary | ICD-10-CM | POA: Diagnosis present

## 2012-11-28 DIAGNOSIS — I5189 Other ill-defined heart diseases: Secondary | ICD-10-CM | POA: Diagnosis present

## 2012-11-28 DIAGNOSIS — Z9189 Other specified personal risk factors, not elsewhere classified: Secondary | ICD-10-CM

## 2012-11-28 DIAGNOSIS — K7689 Other specified diseases of liver: Secondary | ICD-10-CM | POA: Diagnosis present

## 2012-11-28 DIAGNOSIS — E669 Obesity, unspecified: Secondary | ICD-10-CM | POA: Diagnosis present

## 2012-11-28 DIAGNOSIS — G43909 Migraine, unspecified, not intractable, without status migrainosus: Secondary | ICD-10-CM | POA: Diagnosis present

## 2012-11-28 DIAGNOSIS — I2510 Atherosclerotic heart disease of native coronary artery without angina pectoris: Principal | ICD-10-CM

## 2012-11-28 DIAGNOSIS — E119 Type 2 diabetes mellitus without complications: Secondary | ICD-10-CM

## 2012-11-28 DIAGNOSIS — D696 Thrombocytopenia, unspecified: Secondary | ICD-10-CM | POA: Diagnosis not present

## 2012-11-28 DIAGNOSIS — E871 Hypo-osmolality and hyponatremia: Secondary | ICD-10-CM | POA: Diagnosis not present

## 2012-11-28 DIAGNOSIS — R079 Chest pain, unspecified: Secondary | ICD-10-CM

## 2012-11-28 DIAGNOSIS — I872 Venous insufficiency (chronic) (peripheral): Secondary | ICD-10-CM | POA: Diagnosis present

## 2012-11-28 DIAGNOSIS — D62 Acute posthemorrhagic anemia: Secondary | ICD-10-CM | POA: Diagnosis not present

## 2012-11-28 DIAGNOSIS — Z720 Tobacco use: Secondary | ICD-10-CM | POA: Diagnosis present

## 2012-11-28 HISTORY — DX: Hyperlipidemia, unspecified: E78.5

## 2012-11-28 HISTORY — DX: Atherosclerotic heart disease of native coronary artery without angina pectoris: I25.10

## 2012-11-28 HISTORY — DX: Acute myocardial infarction, unspecified: I21.9

## 2012-11-28 HISTORY — DX: Type 2 diabetes mellitus without complications: E11.9

## 2012-11-28 HISTORY — DX: Essential (primary) hypertension: I10

## 2012-11-28 HISTORY — DX: Dorsalgia, unspecified: M54.9

## 2012-11-28 HISTORY — DX: Tobacco use: Z72.0

## 2012-11-28 LAB — CBC WITH DIFFERENTIAL/PLATELET
Eosinophils Absolute: 0.1 10*3/uL (ref 0.0–0.7)
Hemoglobin: 14.7 g/dL (ref 13.0–17.0)
Lymphs Abs: 2.2 10*3/uL (ref 0.7–4.0)
MCH: 33.7 pg (ref 26.0–34.0)
Monocytes Relative: 7 % (ref 3–12)
Neutro Abs: 3.4 10*3/uL (ref 1.7–7.7)
Neutrophils Relative %: 55 % (ref 43–77)
Platelets: 136 10*3/uL — ABNORMAL LOW (ref 150–400)
RBC: 4.36 MIL/uL (ref 4.22–5.81)
WBC: 6.2 10*3/uL (ref 4.0–10.5)

## 2012-11-28 LAB — COMPREHENSIVE METABOLIC PANEL
BUN: 7 mg/dL (ref 6–23)
CO2: 31 mEq/L (ref 19–32)
Calcium: 9.5 mg/dL (ref 8.4–10.5)
Chloride: 99 mEq/L (ref 96–112)
Creatinine, Ser: 0.66 mg/dL (ref 0.50–1.35)
GFR calc Af Amer: >90 mL/min (ref >90)
GFR calc non Af Amer: >90 mL/min (ref >90)
Glucose, Bld: 161 mg/dL — ABNORMAL HIGH (ref 70–99)
Total Bilirubin: 0.3 mg/dL (ref 0.3–1.2)

## 2012-11-28 LAB — GLUCOSE, CAPILLARY: Glucose-Capillary: 153 mg/dL — ABNORMAL HIGH (ref 70–99)

## 2012-11-28 MED ORDER — ONDANSETRON HCL 4 MG PO TABS: 4.0000 mg | ORAL_TABLET | Freq: Four times a day (QID) | ORAL | Status: DC | PRN

## 2012-11-28 MED ORDER — INSULIN ASPART 100 UNIT/ML ~~LOC~~ SOLN
0.0000 [IU] | Freq: Three times a day (TID) | SUBCUTANEOUS | Status: DC
  Administered 2012-11-29: 2 [IU] via SUBCUTANEOUS

## 2012-11-28 MED ORDER — ASPIRIN EC 325 MG PO TBEC
325.0000 mg | DELAYED_RELEASE_TABLET | Freq: Every day | ORAL | Status: DC
  Administered 2012-11-28 – 2012-12-02 (×4): 325 mg via ORAL
  Filled 2012-11-28 (×6): qty 1

## 2012-11-28 MED ORDER — ONDANSETRON HCL 4 MG/2ML IJ SOLN: 4.0000 mg | Freq: Four times a day (QID) | INTRAMUSCULAR | Status: DC | PRN

## 2012-11-28 MED ORDER — ACETAMINOPHEN 650 MG RE SUPP: 650.0000 mg | Freq: Four times a day (QID) | RECTAL | Status: DC | PRN

## 2012-11-28 MED ORDER — SODIUM CHLORIDE 0.9 % IJ SOLN
3.0000 mL | Freq: Two times a day (BID) | INTRAMUSCULAR | Status: DC
  Administered 2012-11-28 – 2012-11-30 (×2): 3 mL via INTRAVENOUS

## 2012-11-28 MED ORDER — ACETAMINOPHEN 325 MG PO TABS: 650.0000 mg | ORAL_TABLET | Freq: Four times a day (QID) | ORAL | Status: DC | PRN

## 2012-11-28 MED ORDER — NITROGLYCERIN 0.4 MG/HR TD PT24
0.4000 mg | MEDICATED_PATCH | Freq: Every day | TRANSDERMAL | Status: DC
  Administered 2012-11-28: 0.4 mg via TRANSDERMAL
  Filled 2012-11-28 (×2): qty 1

## 2012-11-28 MED ORDER — SODIUM CHLORIDE 0.9 % IV SOLN
INTRAVENOUS | Status: DC
  Administered 2012-11-28: 23:00:00 via INTRAVENOUS

## 2012-11-28 MED ORDER — ENOXAPARIN SODIUM 40 MG/0.4ML ~~LOC~~ SOLN
40.0000 mg | SUBCUTANEOUS | Status: DC
  Administered 2012-11-28: 40 mg via SUBCUTANEOUS
  Filled 2012-11-28 (×2): qty 0.4

## 2012-11-28 MED ORDER — CYCLOBENZAPRINE HCL 10 MG PO TABS
10.0000 mg | ORAL_TABLET | Freq: Once | ORAL | Status: AC
  Administered 2012-11-28: 10 mg via ORAL
  Filled 2012-11-28: qty 1

## 2012-11-28 NOTE — Progress Notes (Signed)
Pt is now on unit 2000 in rm 2012. Pt has no orders yet, will notify MD on call.  Meri Pelot M

## 2012-11-28 NOTE — H&P (Signed)
[facility] History and Physical  THARON BOMAR Church:[id number] DOB: 06/29/1951 DOA: 11/28/2012  Referring physician: Patient transferred from [facility]. PCP: No PCP Per Patient  Specialists: Dr. Tresa Endo of [facility] heart and vascular.  Chief Complaint: Chest pain.  HPI: Dylan Church is a 62 y.o. male with known history of CAD status post stenting has started experiencing chest pain today morning while dropping his grandson to school. Patient's chest pain is retrosternal increase in exertion and relieved by rest and nitroglycerin. It happened 3-4 times and at that point he went to the ER at [facility]. EKG cardiac enzymes and chest x-ray were unremarkable. Patient has been transferred here to [facility] for further management. Patient at this time is chest pain-free and not in acute distress. Patient denies any nausea vomiting abdominal pain dizziness palpitations or diarrhea.  Patient's glucose was elevated in the ER and was given IV insulin 10 units and presently his sugar has been in the 100s. Patient states that he usually takes insulin in the morning which he had not taken today.  Labs done at [facility] show WBC of 6 hemoglobin of 14 platelets of 146 sodium 132 potassium 4.2 chloride 96 creatinine 0.6 troponin less than 0.01. EKG normal sinus rhythm.  Review of Systems: As presented in the history of presenting illness, rest negative.  Past Medical History  Diagnosis Date  . Hypertension   . Myocardial infarction   . Diabetes mellitus without complication   . Hyperlipidemia    Past Surgical History  Procedure Laterality Date  . Cardiac catheterization    . Coronary angioplasty    . Cholecystectomy     Social History:  reports that he has been smoking.  He does not have any smokeless tobacco history on file. He reports that he does not drink alcohol. His drug history is not on file. Lives at home. where does patient live-- Can do ADLs. Can patient  participate in ADLs?  Not on File  Family History  Problem Relation Age of Onset  . CAD Sister       Prior to Admission medications   Not on File   Physical Exam: Filed Vitals:   11/28/12 2019  BP: 115/68  Pulse: 62  Temp: 97.2 F (36.2 C)  TempSrc: Oral  Resp: 17  Height: 5\' 10"  (1.778 m)  Weight: 94.257 kg (207 lb 12.8 oz)  SpO2: 96%     General:  Well-developed well-nourished.  Eyes: Anicteric no pallor.  ENT: No discharge from the ears eyes nose or mouth.  Neck: No mass felt.  Cardiovascular: S1-S2 heard.  Respiratory: No rhonchi no crepitations.  Abdomen: Soft nontender bowel sounds present.  Skin: No rash.  Musculoskeletal: No edema.  Psychiatric: Appears normal.  Neurologic: Moves all extremities.  Labs on Admission:  Basic Metabolic Panel: No results found for this basename: NA, K, CL, CO2, GLUCOSE, BUN, CREATININE, CALCIUM, MG, PHOS,  in the last 168 hours Liver Function Tests: No results found for this basename: AST, ALT, ALKPHOS, BILITOT, PROT, ALBUMIN,  in the last 168 hours No results found for this basename: LIPASE, AMYLASE,  in the last 168 hours No results found for this basename: AMMONIA,  in the last 168 hours CBC: No results found for this basename: WBC, NEUTROABS, HGB, HCT, MCV, PLT,  in the last 168 hours Cardiac Enzymes: No results found for this basename: CKTOTAL, CKMB, CKMBINDEX, TROPONINI,  in the last 168 hours  BNP (last 3 results) No results found for  this basename: PROBNP,  in the last 8760 hours CBG:  Recent Labs Lab 11/28/12 2000 11/28/12 2128  GLUCAP 153* 144*    Radiological Exams on Admission: No results found.   Assessment/Plan Principal Problem:   Chest pain Active Problems:   DIABETES MELLITUS   HYPERLIPIDEMIA   HYPERTENSION   CAD (coronary artery disease) of artery bypass graft   1. Chest pain with history of CAD status post stenting concerning for unstable angina - cycle cardiac markers.  Aspirin. Continue nitroglycerin patch. Cardiology consult. 2. Diabetes mellitus type 2 - continue home medications with sliding-scale coverage. 3. Hypertension - continue home medications. 4. Tobacco abuse - advised to quit smoking. 5. COPD - presently not wheezing.  Patient's medication list has not been in updated and has to be reviewed and ordered accordingly once updated in the list.    Code Status: Full code.  Family Communication: None.  Disposition Plan: Admit for observation.    Charidy Cappelletti N. [facility] Pager [phone].  If 7PM-7AM, please contact night-coverage [email or web address] Password [facility] 11/28/2012, 9:38 PM

## 2012-11-29 ENCOUNTER — Encounter (HOSPITAL_COMMUNITY): Payer: Self-pay

## 2012-11-29 ENCOUNTER — Other Ambulatory Visit: Payer: Self-pay | Admitting: *Deleted

## 2012-11-29 ENCOUNTER — Encounter (HOSPITAL_COMMUNITY): Payer: Self-pay | Admitting: Cardiology

## 2012-11-29 ENCOUNTER — Encounter (HOSPITAL_COMMUNITY)
Admission: AD | Disposition: A | Discharge status: Home / Self Care | Payer: Self-pay | Source: Other Acute Inpatient Hospital | Attending: Cardiology

## 2012-11-29 DIAGNOSIS — I2510 Atherosclerotic heart disease of native coronary artery without angina pectoris: Secondary | ICD-10-CM

## 2012-11-29 DIAGNOSIS — Z720 Tobacco use: Secondary | ICD-10-CM

## 2012-11-29 HISTORY — DX: Tobacco use: Z72.0

## 2012-11-29 HISTORY — PX: LEFT HEART CATHETERIZATION WITH CORONARY ANGIOGRAM: SHX5451

## 2012-11-29 LAB — CBC
HCT: 39.1 % (ref 39.0–52.0)
Hemoglobin: 13.8 g/dL (ref 13.0–17.0)
MCH: 32.5 pg (ref 26.0–34.0)
RBC: 4.24 MIL/uL (ref 4.22–5.81)

## 2012-11-29 LAB — HEMOGLOBIN A1C
Hgb A1c MFr Bld: 11.6 % — ABNORMAL HIGH (ref <5.7)
Mean Plasma Glucose: 286 mg/dL — ABNORMAL HIGH (ref <117)

## 2012-11-29 LAB — BASIC METABOLIC PANEL
Chloride: 100 mEq/L (ref 96–112)
GFR calc Af Amer: >90 mL/min (ref >90)
GFR calc non Af Amer: >90 mL/min (ref >90)
Glucose, Bld: 186 mg/dL — ABNORMAL HIGH (ref 70–99)
Potassium: 3.7 mEq/L (ref 3.5–5.1)
Sodium: 137 mEq/L (ref 135–145)

## 2012-11-29 LAB — LIPID PANEL
HDL: 21 mg/dL — ABNORMAL LOW (ref >39)
Triglycerides: 200 mg/dL — ABNORMAL HIGH (ref <150)

## 2012-11-29 LAB — TROPONIN I: Troponin I: <0.3 ng/mL (ref <0.30)

## 2012-11-29 LAB — GLUCOSE, CAPILLARY: Glucose-Capillary: 175 mg/dL — ABNORMAL HIGH (ref 70–99)

## 2012-11-29 SURGERY — LEFT HEART CATHETERIZATION WITH CORONARY ANGIOGRAM
Anesthesia: LOCAL

## 2012-11-29 MED ORDER — NITROGLYCERIN IN D5W 200-5 MCG/ML-% IV SOLN
2.0000 ug/min | INTRAVENOUS | Status: DC
  Administered 2012-11-29 – 2012-12-01 (×2): 10 ug/min via INTRAVENOUS
  Filled 2012-11-29 (×3): qty 250

## 2012-11-29 MED ORDER — LIDOCAINE HCL (PF) 1 % IJ SOLN
INTRAMUSCULAR | Status: AC
  Filled 2012-11-29: qty 30

## 2012-11-29 MED ORDER — ASPIRIN 81 MG PO CHEW
324.0000 mg | CHEWABLE_TABLET | ORAL | Status: AC
  Administered 2012-11-29: 324 mg via ORAL

## 2012-11-29 MED ORDER — HEPARIN SODIUM (PORCINE) 1000 UNIT/ML IJ SOLN
INTRAMUSCULAR | Status: AC
  Filled 2012-11-29: qty 1

## 2012-11-29 MED ORDER — INSULIN DETEMIR 100 UNIT/ML ~~LOC~~ SOLN
60.0000 [IU] | Freq: Every day | SUBCUTANEOUS | Status: DC
  Administered 2012-11-30 – 2012-12-01 (×2): 60 [IU] via SUBCUTANEOUS
  Filled 2012-11-29 (×2): qty 0.6

## 2012-11-29 MED ORDER — HEPARIN (PORCINE) IN NACL 2-0.9 UNIT/ML-% IJ SOLN
INTRAMUSCULAR | Status: AC
  Filled 2012-11-29: qty 1000

## 2012-11-29 MED ORDER — EPTIFIBATIDE BOLUS VIA INFUSION
180.0000 ug/kg | Freq: Once | INTRAVENOUS | Status: AC
  Administered 2012-11-29: 15:00:00 17000 ug via INTRAVENOUS
  Filled 2012-11-29: qty 23

## 2012-11-29 MED ORDER — HEPARIN (PORCINE) IN NACL 100-0.45 UNIT/ML-% IJ SOLN
2050.0000 [IU]/h | INTRAMUSCULAR | Status: DC
  Administered 2012-11-29: 1100 [IU]/h via INTRAVENOUS
  Administered 2012-11-30: 1400 [IU]/h via INTRAVENOUS
  Administered 2012-11-30: 16:00:00 1600 [IU]/h via INTRAVENOUS
  Administered 2012-11-30 – 2012-12-01 (×3): 1800 [IU]/h via INTRAVENOUS
  Administered 2012-12-02: 2050 [IU]/h via INTRAVENOUS
  Filled 2012-11-29 (×6): qty 250

## 2012-11-29 MED ORDER — SODIUM CHLORIDE 0.9 % IJ SOLN: 3.0000 mL | Freq: Two times a day (BID) | INTRAMUSCULAR | Status: DC

## 2012-11-29 MED ORDER — PANTOPRAZOLE SODIUM 40 MG PO TBEC
40.0000 mg | DELAYED_RELEASE_TABLET | Freq: Every day | ORAL | Status: DC
  Administered 2012-11-30 – 2012-12-03 (×3): 40 mg via ORAL
  Filled 2012-11-29 (×3): qty 1

## 2012-11-29 MED ORDER — SODIUM CHLORIDE 0.9 % IV SOLN
1.0000 mL/kg/h | INTRAVENOUS | Status: AC
  Administered 2012-11-29: 1 mL/kg/h via INTRAVENOUS

## 2012-11-29 MED ORDER — ISOSORBIDE MONONITRATE ER 60 MG PO TB24
60.0000 mg | ORAL_TABLET | Freq: Every day | ORAL | Status: DC
  Filled 2012-11-29: qty 1

## 2012-11-29 MED ORDER — SODIUM CHLORIDE 0.9 % IJ SOLN
3.0000 mL | INTRAMUSCULAR | Status: DC | PRN
  Administered 2012-12-01: 3 mL via INTRAVENOUS

## 2012-11-29 MED ORDER — INSULIN DETEMIR 100 UNIT/ML ~~LOC~~ SOLN
45.0000 [IU] | Freq: Every day | SUBCUTANEOUS | Status: DC
  Administered 2012-11-29 – 2012-11-30 (×2): 45 [IU] via SUBCUTANEOUS
  Filled 2012-11-29 (×3): qty 0.45

## 2012-11-29 MED ORDER — EPTIFIBATIDE 75 MG/100ML IV SOLN
1.0000 ug/kg/min | INTRAVENOUS | Status: DC
  Administered 2012-11-29 – 2012-12-02 (×7): 1 ug/kg/min via INTRAVENOUS
  Filled 2012-11-29 (×11): qty 100

## 2012-11-29 MED ORDER — SODIUM CHLORIDE 0.9 % IV SOLN: 250.0000 mL | INTRAVENOUS | Status: DC | PRN

## 2012-11-29 MED ORDER — VERAPAMIL HCL 2.5 MG/ML IV SOLN
INTRAVENOUS | Status: AC
  Filled 2012-11-29: qty 2

## 2012-11-29 MED ORDER — ASPIRIN 81 MG PO CHEW
CHEWABLE_TABLET | ORAL | Status: AC
  Filled 2012-11-29: qty 4

## 2012-11-29 MED ORDER — SODIUM CHLORIDE 0.9 % IJ SOLN: 3.0000 mL | INTRAMUSCULAR | Status: DC | PRN

## 2012-11-29 MED ORDER — ONDANSETRON HCL 4 MG/2ML IJ SOLN: 4.0000 mg | Freq: Four times a day (QID) | INTRAMUSCULAR | Status: DC | PRN

## 2012-11-29 MED ORDER — INSULIN ASPART 100 UNIT/ML ~~LOC~~ SOLN
0.0000 [IU] | Freq: Three times a day (TID) | SUBCUTANEOUS | Status: DC
  Administered 2012-11-29: 18:00:00 2 [IU] via SUBCUTANEOUS
  Administered 2012-11-30: 19:00:00 3 [IU] via SUBCUTANEOUS
  Administered 2012-11-30 – 2012-12-01 (×2): 2 [IU] via SUBCUTANEOUS
  Administered 2012-12-01: 5 [IU] via SUBCUTANEOUS

## 2012-11-29 MED ORDER — HYDROMORPHONE HCL PF 1 MG/ML IJ SOLN: 1.0000 mg | INTRAMUSCULAR | Status: DC | PRN

## 2012-11-29 MED ORDER — FENTANYL CITRATE 0.05 MG/ML IJ SOLN
INTRAMUSCULAR | Status: AC
  Filled 2012-11-29: qty 2

## 2012-11-29 MED ORDER — GABAPENTIN 400 MG PO CAPS
800.0000 mg | ORAL_CAPSULE | Freq: Three times a day (TID) | ORAL | Status: DC
  Administered 2012-11-29 – 2012-12-07 (×21): 800 mg via ORAL
  Filled 2012-11-29 (×28): qty 2

## 2012-11-29 MED ORDER — SODIUM CHLORIDE 0.9 % IJ SOLN
3.0000 mL | Freq: Two times a day (BID) | INTRAMUSCULAR | Status: DC
  Administered 2012-11-30: 3 mL via INTRAVENOUS

## 2012-11-29 MED ORDER — INSULIN DETEMIR 100 UNIT/ML ~~LOC~~ SOLN: 45.0000 [IU] | Freq: Two times a day (BID) | SUBCUTANEOUS | Status: DC

## 2012-11-29 MED ORDER — ALPRAZOLAM 0.25 MG PO TABS: 0.2500 mg | ORAL_TABLET | Freq: Three times a day (TID) | ORAL | Status: DC | PRN

## 2012-11-29 MED ORDER — MORPHINE SULFATE 2 MG/ML IJ SOLN: 2.0000 mg | INTRAMUSCULAR | Status: DC | PRN

## 2012-11-29 MED ORDER — TRAMADOL HCL 50 MG PO TABS
50.0000 mg | ORAL_TABLET | Freq: Four times a day (QID) | ORAL | Status: DC | PRN
  Administered 2012-12-01: 06:00:00 50 mg via ORAL
  Filled 2012-11-29: qty 1

## 2012-11-29 MED ORDER — ATORVASTATIN CALCIUM 20 MG PO TABS
20.0000 mg | ORAL_TABLET | Freq: Every day | ORAL | Status: DC
  Administered 2012-11-29 – 2012-12-06 (×7): 20 mg via ORAL
  Filled 2012-11-29 (×9): qty 1

## 2012-11-29 MED ORDER — CYCLOBENZAPRINE HCL 10 MG PO TABS: 10.0000 mg | ORAL_TABLET | Freq: Once | ORAL | Status: DC

## 2012-11-29 MED ORDER — INSULIN ASPART 100 UNIT/ML ~~LOC~~ SOLN
0.0000 [IU] | Freq: Every day | SUBCUTANEOUS | Status: DC
  Administered 2012-11-30 – 2012-12-01 (×2): 2 [IU] via SUBCUTANEOUS

## 2012-11-29 MED ORDER — ZOLPIDEM TARTRATE 5 MG PO TABS
5.0000 mg | ORAL_TABLET | Freq: Every evening | ORAL | Status: DC | PRN
  Administered 2012-11-30 – 2012-12-02 (×2): 5 mg via ORAL
  Filled 2012-11-29 (×2): qty 1

## 2012-11-29 MED ORDER — EPTIFIBATIDE 75 MG/100ML IV SOLN
INTRAVENOUS | Status: AC
  Administered 2012-11-29: 1 ug/kg/min via INTRAVENOUS
  Filled 2012-11-29: qty 100

## 2012-11-29 MED ORDER — MIDAZOLAM HCL 2 MG/2ML IJ SOLN
INTRAMUSCULAR | Status: AC
  Filled 2012-11-29: qty 2

## 2012-11-29 NOTE — Progress Notes (Addendum)
[facility] PROGRESS NOTE  FITZPATRICK ALBERICO JYN:[id number] DOB: June 02, 1951 DOA: 11/28/2012 PCP: No PCP Per Patient  Assessment/Plan: Principal Problem:   Chest pain Active Problems:   DIABETES MELLITUS   HYPERLIPIDEMIA   HYPERTENSION   CAD (coronary artery disease) of artery bypass graft   1. Chest pain with history of CAD status post stenting concerning for unstable angina - discussed with Dr. Herbie Baltimore, negative cardiac enzymes, continue Aspirin. Continue nitroglycerin patch. Made the  patient n.p.o. in case further testing is required, obtain a 2-D echo 2. Diabetes mellitus type 2 - continue home medications with sliding-scale coverage. Hemoglobin A1c, lipid panel pending 3. Hypertension - continue home medications. 4. Tobacco abuse - advised to quit smoking. 5. COPD - presently not wheezing.     Code Status: full Family Communication: family updated about patient's clinical progress Disposition Plan:    Patient transferred to Dr. Herbie Baltimore     Brief narrative: 62 y.o. male with known history of CAD status post stenting has started experiencing chest pain today morning while dropping his grandson to school. Patient's chest pain is retrosternal increase in exertion and relieved by rest and nitroglycerin. It happened 3-4 times and at that point he went to the ER at [facility]. EKG cardiac enzymes and chest x-ray were unremarkable. Patient has been transferred here to [facility] for further management. Patient at this time is chest pain-free and not in acute distress. Patient denies any nausea vomiting abdominal pain dizziness palpitations or diarrhea.  Patient's glucose was elevated in the ER and was given IV insulin 10 units and presently his sugar has been in the 100s. Patient states that he usually takes insulin in the morning which he had not taken today.  Labs done at [facility] show WBC of 6 hemoglobin of 14 platelets of 146 sodium 132 potassium 4.2 chloride 96  creatinine 0.6 troponin less than 0.01. EKG normal sinus rhythm.      Consultants:  [facility]  Procedures:  None  Antibiotics:  None  HPI/Subjective: Chest pain-free  Objective: Filed Vitals:   11/28/12 2019 11/29/12 0456  BP: 115/68 96/55  Pulse: 62 67  Temp: 97.2 F (36.2 C) 97.2 F (36.2 C)  TempSrc: Oral Oral  Resp: 17 18  Height: 5\' 10"  (1.778 m)   Weight: 94.257 kg (207 lb 12.8 oz)   SpO2: 96% 97%   No intake or output data in the 24 hours ending 11/29/12 0741  Exam:  HENT:  Head: Atraumatic.  Nose: Nose normal.  Mouth/Throat: Oropharynx is clear and moist.  Eyes: Conjunctivae are normal. Pupils are equal, round, and reactive to light. No scleral icterus.  Neck: Neck supple. No tracheal deviation present.  Cardiovascular: Normal rate, regular rhythm, normal heart sounds and intact distal pulses.  Pulmonary/Chest: Effort normal and breath sounds normal. No respiratory distress.  Abdominal: Soft. Normal appearance and bowel sounds are normal. She exhibits no distension. There is no tenderness.  Musculoskeletal: She exhibits no edema and no tenderness.  Neurological: She is alert. No cranial nerve deficit.    Data Reviewed: Basic Metabolic Panel:  Recent Labs Lab 11/28/12 2300 11/29/12 0545  NA 135 137  K 3.8 3.7  CL 99 100  CO2 31 28  GLUCOSE 161* 186*  BUN 7 7  CREATININE 0.66 0.69  CALCIUM 9.5 9.4    Liver Function Tests:  Recent Labs Lab 11/28/12 2300  AST 31  ALT 26  ALKPHOS 127*  BILITOT 0.3  PROT 7.2  ALBUMIN 3.2*   No results found for this basename: LIPASE, AMYLASE,  in the last 168 hours No results found for this basename: AMMONIA,  in the last 168 hours  CBC:  Recent Labs Lab 11/28/12 2300 11/29/12 0545  WBC 6.2 5.6  NEUTROABS 3.4  --   HGB 14.7 13.8  HCT 40.5 39.1  MCV 92.9 92.2  PLT 136* 131*    Cardiac Enzymes:  Recent Labs Lab 11/28/12 2300 11/29/12 0545  TROPONINI <0.30  <0.30   BNP (last 3 results) No results found for this basename: PROBNP,  in the last 8760 hours   CBG:  Recent Labs Lab 11/28/12 2000 11/28/12 2128 11/29/12 0610  GLUCAP 153* 144* 175*    No results found for this or any previous visit (from the past 240 hour(s)).   Studies: No results found.  Scheduled Meds: . aspirin EC  325 mg Oral Daily  . cyclobenzaprine  10 mg Oral Once  . enoxaparin (LOVENOX) injection  40 mg Subcutaneous Q24H  . insulin aspart  0-9 Units Subcutaneous TID WC  . nitroGLYCERIN  0.4 mg Transdermal Daily  . sodium chloride  3 mL Intravenous Q12H   Continuous Infusions: . sodium chloride 10 mL/hr at 11/28/12 2311    Principal Problem:   Chest pain Active Problems:   DIABETES MELLITUS   HYPERLIPIDEMIA   HYPERTENSION   CAD (coronary artery disease) of artery bypass graft    Time spent: 40 minutes   [facility]  [facility] Pager [phone]. If 8PM-8AM, please contact night-coverage at [email or web address], password [facility] 11/29/2012, 7:41 AM  LOS: 1 day

## 2012-11-29 NOTE — Brief Op Note (Addendum)
11/28/2012 - 11/29/2012  11:27 AM  PATIENT:  Dylan Church  62 y.o. male with a distant h/o CAD - PCI to RCA & Cx (last PCI in 2004).  Admitted with Unstable Angina from [facility].  Had moderate to high risk Myoview ST in Aug 2013. Referred for catheterization.  TIMI risk Score ~4.  PRE-OPERATIVE DIAGNOSIS:  Unstable Angina, Inferior Ischemia on ST.  POST-OPERATIVE DIAGNOSIS:   Severe multivessel coronary artery disease involving the proximal and distal mid circumflex with an 80% proximal lesion prior to giving off lateral OM1 then a 95% in-stent restenosis of the previously placed Taxus DES stent 2004. This is followed by bifurcation into 2 posterolateral branches and a left posterior descending artery. Also noted is a proximal LAD 95%  Stenosis.  The RCA is patent stent in visit smaller moderate caliber vessel and terminates as a very small, diminutive co-dominant PDA.  Well-preserved ejection fraction of 55-60% with no regional wall motion abnormalities.  Hemodynamics: Aortic pressures 118/67 mmHg with a mean of 87 mmHg. Left ventricular pressures 126/9 mmHg with the EDP of 11 mmHg  PROCEDURE:  Procedure(s): LEFT HEART CATHETERIZATION WITH CORONARY ANGIOGRAM (N/A)  SURGEON:  Surgeon(s) and Role:    * Marykay Lex, MD - Primary  ANESTHESIA:   local and IV sedation; lidocaine 2 mL, Versed 2 mg, fentanyl 75 mcg.  EBL:   < 10 mL  PROCEDURE: Right radial artery access with an Angiocath micropuncture kit using Seldinger technique --> 5 French glide sheath. A 5 French TIG 4.0 catheter advanced over Versicore wire for LCA and RCA angiography. Exchanged over long exchange safety J. for angled pigtail catheter for ventricular hemodynamics and LV gram.  No complications. The patient was stable before, during, and following the procedure.  MEDICATIONS USED:   Omnipaque contrast 70 mL, IV heparin 5000 units. Radial Cocktail: 5 mg Verapamil, 400 mcg NTG, 2 ml 2% Lidocaine in 10 ml  NS  TOURNIQUET:  TR band applied the arteriotomy site at 1055 hours, 11 mL air; nonocclusive hemostasis  DICTATION: .Note written in EPIC  PLAN OF CARE: Admit to inpatient  -- CT surgery consultation for possible CABG. Will switch to IV nitroglycerin, and start IV heparin 6 hours post TR band removal. We'll start Integrilin with a single bolus and run at reduced rate due to the unstable nature of his symptoms and to severe lesions.  When necessary morphine written for back pain.  We will need to add back home medications upon arrival to the floor following pharmacy medication reconciliation.  PATIENT DISPOSITION:  PACU - hemodynamically stable. chest pain free   Delay start of Pharmacological VTE agent (>24hrs) due to surgical blood loss or risk of bleeding: not applicable  HARDING,DAVID W, M.D., M.S. [facility] [address] [place], [place]  [address]  [phone] Pager # [phone] 11/29/2012 11:36 AM

## 2012-11-29 NOTE — CV Procedure (Signed)
[facility] CARDIAC CATHETERIZATION REPORT  NAME:  Dylan Church   MRN: [id number] DOB:  October 12, 1950   ADMIT DATE: 11/28/2012 Procedure Date: 11/29/2012  INTERVENTIONAL CARDIOLOGIST: Marykay Lex, M.D., MS PRIMARY CARE PROVIDER: No PCP Per Patient PRIMARY CARDIOLOGIST: Duffy Rhody, MD [place] [place] [facility] - Dept of Cardiology).  PATIENT:  Dylan Church is a 62 y.o. male with a distant h/o CAD - PCI to RCA & Cx (last PCI in 2004). Admitted with Unstable Angina from [facility]. Had moderate sized partially reversible perfusion defect on a Myoview ST in Aug 2013. Referred for catheterization. TIMI risk Score ~4. Additional risk factors include long-term smoking three quarters pack per day for 30 years, Hypertension, hyperlipidemia and diabetes. He reports noting exertional chest pain for the past 6 months; however, being over last few weeks she's been noticing increasing with intensity and duration and with less activity. Yesterday he had at least 3 episodes at rest after the initial one with exertion. He did not closer 4 times yesterday. He noted left-sided pressure-like squeezing sensation in the chest it was 7/10 maximum intensity similar to previous anginal equivalent.  PRE-OPERATIVE DIAGNOSIS:    Crescendo Unstable Angina  Abnormal Myoview Stress Test  History of known coronary artery disease status post PCI of both the Circumflex and RCA.  PROCEDURES PERFORMED:    Left Heart Catheterization with Native Coronary Angiography via 5 French Right Radial Artery Access.  Left Ventriculography -- RAO projection, 12 mL second for total of 25 mL  PROCEDURE:Consent:  Risks of procedure as well as the alternatives and risks of each were explained to the (patient/caregiver).  Consent for procedure obtained. Consent for signed by MD and patient with RN witness -- placed on chart.  PROCEDURE: The patient was brought to the 2nd Floor [facility]  [facility] in the fasting state and prepped and draped in the usual sterile fashion for Right groin or radial access. A modified Allen's test with plethysmography was performed, revealing excellent Ulnar artery collateral flow.  Sterile technique was used including antiseptics, cap, gloves, gown, hand hygiene, mask and sheet.  Skin prep: Chlorhexidine.  Time Out: Verified patient identification, verified procedure, site/side was marked, verified correct patient position, special equipment/implants available, medications/allergies/relevent history reviewed, required imaging and test results available.  Performed  Access:  Right Radial Artery Artery;  5 Fr  Glide Sheath --  Seldinger technique using an Angiocath Micropuncture Kit.  Radial Cocktail administered intra-arterial;  IV Heparin 5000 units administered when the catheter reached the ascending aorta.  Diagnostic:  TIG 4.0,  advanced over a Versicore wire into the ascending aorta - engage first the left and right coronary artery; exchanged over long exchange safety J-wire  Left  and Right Coronary Artery Angiography:  TIG 4.0  LV Hemodynamics (LV Gram):  Angled pigtail catheter  MEDICATIONS:  Anesthesia:  Local Lidocaine 2 ml  Sedation:  2 mg IV Versed, 75 mcg IV fentanyl ;   Omnipaque Contrast: 70 ml  Anticoagulation:  IV Heparin 5000 Units  Anti-Platelet Agent:  Integrilin bolus and drip initiated post cath  Hemodynamics:  Central Aortic / Mean Pressures: 118/67 mmHg; 87 mmHg  Left Ventricular Pressures / EDP: 123/4 mmHg; 12 mmHg  TR Band:  1050 Hours, 11 mL air; reverse Allen's test with plethysmography revealed nonocclusive hemostasis.  FINDINGS: Left Ventriculography:  EF: 55-60 %  Wall Motion: Normal  Coronary Anatomy:  Left Main: Very short large-caliber vessel, bifurcates normally into the  LAD and Circumflex1.  Angiographically normal. LAD: Large-caliber vessel with a focal 90-90% proximal stenosis just  after small first diagonal branch. This is followed by a significant septal trunk and a small D2. The remainder the vessel is a normal caliber vessel tapering down to and around the apex.  Beyond the proximal lesion there is minimal irregularities, and good target vessel.  Several small diagonal vessels were noted.  D2 is in no significant vessel as after the proximal lesion. It is a smaller moderate caliber vessel with ostial 40% stenosis..   Left Circumflex: Large caliber, likely codominant vessel that gives rise to a significant posterior lateral system as well as appears to be a co-PDA. There is a ostial 30% stenosis followed by a very small marginal branch. There is a proximal 80% stenosis that is somewhat hazy just prior to the first bifurcation into a lateral OM1 and the AV groove circumflex.   AV groove circumflex - there is a proximally placed stent beyond the bifurcation with diffuse in-stent restenosis of up to 95% that appears irregular in nature. Beyond the stent the vessel trifurcates into 2 posterolateral branches and a branch that courses almost as a left posterior descending artery. Beyond the stent there is minimal luminal irregularities.  OM1: Moderate caliber vessel that reaches almost to the inferoapex; minimal luminal irregularities    RCA:  moderate to large caliber codominant vessel that gives rise to a very small, diminutive posterior descending artery.  There is a proximal stent is widely patent, and otherwise minimal luminal irregularities.  PATIENT DISPOSITION:    The patient was transferred to the PACU holding area in a hemodynamicaly stable, chest pain free condition.  The patient tolerated the procedure well, and there were no complications.  EBL:   < 10 ml  The patient was stable before, during, and after the procedure.  POST-OPERATIVE DIAGNOSIS:    Severe multivessel coronary artery disease involving the proximal and distal mid circumflex with an 80% proximal  lesion prior to giving off lateral OM1 then a 95% in-stent restenosis of the previously placed Taxus DES stent 2004. This is followed by bifurcation into 2 posterolateral branches and a left posterior descending artery. Also noted is a proximal LAD 95% Stenosis.   The RCA is patent stent in visit smaller moderate caliber vessel and terminates as a very small, diminutive co-dominant PDA.   Well-preserved ejection fraction of 55-60% with no regional wall motion abnormalities.   Overall normal hemodynamics with normal EDP.   PLAN OF CARE:  Based on his presentation be unstable angina with 2 significant 90+ percent lesions that are irregular in appearance, I did not feel like he is stable for transfer to another facility. At this point I will Transfer him to the post procedure step down unit.    Initiate IV Integrilin and nitroglycerin upon arrival. Will start heparin 6 hours post TR band removal.  Simply due to the extent of stenosis in the Circumflex with proximal LAD stenosis, I feel that this diabetic patient would best served with for CT surgical consultation for Coronary Artery Bypass Grafting.  Restart home medications once medication reconciliation has been performed.  As I discussed the findings and plan would be covering [place] [facility]ologist in [place], [place]. He agrees and the patient is likely not stable for transfer to another facility for bypass surgery. Affect the patient would prefer to have the surgery performed here due to proximity to where he lives and comfort level with the hospital.  Based on the recommendations from the [facility] Cardiologist, will have Social Work Consult confer with the [employer] to ensure VA coverage.   Marykay Lex, M.D., M.S. [facility] [address] [place], [place]  [address]  [phone]  11/29/2012 11:17 AM

## 2012-11-29 NOTE — Consult Note (Addendum)
I have seen and evaluated the patient this AM along with Boyce Medici PA. I agree with her findings, examination as well as impression recommendations.  Dylan Church is a very pleasant 62 year old am with a distant history of cardiac disease including angioplasty to the circumflex and right coronary artery before 2001.  He underwent cutting balloon angioplasty of the RCA bare-metal stent using a 2.75 x 15 mm cutting balloon in December 2001 by Dr. Lavonne Chick. The circumflex with was patent at that time. However he read presented in October 2004 with unstable angina and was found to have reversible perfusion defect. Catheterization showed a severe mid circumflex lesion in the AV groove circumflex to a Taxus DES 3.0 mm and 16 mm.  Since that time he has seen Dr. Tresa Endo once distantly in our clinic. His been [facility] with [facility] otherwise. He started having exertional chest heaviness and pressure again in July August time frame of 2013. He underwent a LexiScan Myoview the [place] system which showed a moderate reversible defect in the inferior wall. Despite this is not scheduled to see a cardiologist at the [facility], according to his  report, until a week from today.  Didn't fall the symptoms there were mostly exertional however over last couple weeks she's been noticing a having more frequency more intensity to the point where yesterday he had 4 occasions were to take nitroglycerin at least 2 of them were at rest. It extends and was relieved with nitroglycerin but then recurred and times a day. Therefore he presented to [facility] emergency room for evaluation. His ECG has some nonspecific ST segment abnormalities in the precordial leads but no suggestion of significant ischemia. He is ruled out for myocardial infarction. Currently he is chest pain-free.  His exam is notable for diffuse rhonchorous breath sounds due to his long-term smoking and COPD. He also has extensive venous stasis dermatitis on the  right greater than left leg. There is skin discoloration and various stages of relatively well-healing wounds on the leg but no fashion wounds and no sign of cellulitis.    At this point we have a gentleman with known coronary disease presenting with unstable angina symptoms. He has had multiple episodes of angina over last 24 hours, currently takes aspirin, he has multiple cardiac risk factors, and known coronary artery disease. His TIMI risk score is 4, giving and wrist November segments and next 14 days in 19-20%.  We discussed [place] course of action the plan is for to proceed directly with cardiac catheterization via radial approach due to his chronic back pain. He specifically indicated he would like to have his catheterization done here and not wait to have it done at the [place]. He feels carpal dislocation as has previously undergone PCI here.  I discussed the risks, benefits, alternatives, indications of the procedure.   Risks / Complications include, but not limited to: Death, MI, CVA/TIA, VF/VT (with defibrillation), Bradycardia (need for temporary pacer placement), contrast induced nephropathy, bleeding / bruising / hematoma / pseudoaneurysm, vascular or coronary injury (with possible emergent CT or Vascular Surgery), adverse medication reactions, infection.    The patient voice understanding and agree to proceed.   Consent form was signed and placed on chart upon arrival to the Cath Lab.  [facility] will be happy to take over as primary service for this patient.  We appreciate [facility] assistance with the admission.     Marykay Lex, M.D., M.S. [facility] [address]  [address] [place], [place]  [address]  [phone] Pager # [phone] 11/29/2012 9:21 AM

## 2012-11-29 NOTE — Care Management Note (Signed)
    Page 1 of 2   12/07/2012     3:18:19 PM   CARE MANAGEMENT NOTE 12/07/2012  Patient:  Dylan Church, Dylan Church   Account Number:  [id number]  Date Initiated:  11/29/2012  Documentation initiated by:  Joab Carden  Subjective/Objective Assessment:   PT ADM  ON 11/28/12 WITH CHEST PAIN.  CATH PENDING. PTA, PT INDEPENDENT, LIVES WITH SPOUSE.     Action/Plan:   WILL FOLLOW FOR HOME NEEDS AS PT PROGRESSES.   Anticipated DC Date:  11/30/2012   Anticipated DC Plan:  HOME/SELF CARE  In-house referral  Financial Counselor      DC Planning Services  CM consult      Choice offered to / List presented to:     DME arranged  Levan Hurst      DME agency  [facility]     [facility] arranged  HH-1 RN      [facility] agency  [facility]   Status of service:  Completed, signed off Medicare Important Message given?   (If response is "NO", the following Medicare IM given date fields will be blank) Date Medicare IM given:   Date Additional Medicare IM given:    Discharge Disposition:  HOME W HOME HEALTH SERVICES  Per UR Regulation:  Reviewed for med. necessity/level of care/duration of stay  If discussed at Long Length of Stay Meetings, dates discussed:    Comments:  Contact:  Fuson,Faye A Spouse [phone]  12/07/12 Shaunessy Dobratz,RN,BSN [phone] PT FOR DC HOME TODAY WITH FAMILY.  REQUESTS ROLLATOR WALKER FOR HOME.  CALLED [facility]--THEY STATE THAT THEY CAN GET WALKER FOR PT, BUT IT MAY TAKE SEV DAYS TO A WEEK FOR HIM TO GET IT.  HE DOES HAVE MEDICARE A AND B, AND VA SUGGESTS ORDERING RW THROUGH [facility] THROUGH [facility] SO HE CAN GET IT TODAY.  PT IS AGREEABLE TO THIS.  MD ORDERED HHRN.  [facility] STATES CAN ORDER FROM ANY PROVIDER--THEY WILL AUTHORIZE. BEDSIDE RN FAXED ALL DC RX TO [facility], AS PT PLANS TO PICK UP MEDS ON SATURDAY.  VERIFIED THAT PHARMACY RECEIVED RX.  12-05-12 12noon Johny Shears - [phone] Kyla from [facility] [place] here to see pateint.  Updated on patients condition.   Patient gets meds from [facility] which is only open 8-4:30pm M-F.  If meds needed off hours or w/e can get from [place] in [place], but both locations are for pick up.  Can fax and talk to pharmacist at [phone] - ext [phone]. Contact person [facility].  12-03-12 1:50pm Avie Arenas, RNBSN [phone] Post op Cabg x3 today.  Patient states lives with wife and son - verified are able to care for him on discharge.  CM will continue to follow for further needs.  11/30/12.Marland KitchenMarland KitchenOletta Cohn, RN, BSN, [place] [phone] Pt is V.A.  Notified Sherren Kerns) [facility] [place], [place] [phone] of pt admission to [facility]  Pt PCP is Dr. Ena Dawley fax [phone]. Pt also admitted as self pay- [employer] [place] notified.

## 2012-11-29 NOTE — Plan of Care (Signed)
Problem: Consults Goal: Tobacco Cessation referral if indicated Outcome: Completed/Met Date Met:  11/29/12 Discussed smoking cessation with pt who states he's ready to quit.  Already has nicotine patches at home.  Explained to pt risk of stroke or heart attack if smoking while wearing patch and to wait several hours after removing patch to smoke.  Pt voiced understanding.  States quit for a year before.  Encouragement given, tips for success and 1-800 quit now handouts given.  Denies nicotine craving at this time.

## 2012-11-29 NOTE — Consult Note (Signed)
Reason for Consult: Chest Pain Referring Physician: [facility]   HPI: The patient is a 62 y.o. male with known CAD. On June 24, 2003, Mr. Antuna underwent cardiac  catheterization by Dr. Chanda Busing. The patient was found to have a 95% stenosis in the mid distal  circumflex. A DES was placed and the stenosis went from 95% to 0% residual. Prior to that, in 2002, he underwent cutting balloon angioplasty to the mid RCA for in-stent restenosis. His history is also significant for HTN, HLD, DM and tobacco abuse. He smokes ~3/4 ppd. He presented to [facility] on 11/28/12 with a complaint of chest pain, very similar to the pain he had prior to his previous interventions. He reports angina like pain off and on for the past 6 months. Initially, he only had chest pain with exertion. However, yesterday he had pain at rest. His angina has increased over the past few months in severity, frequency and duration. The pain was left-sided and pressure-like with radiation to the left arm. 7/10 maximum intensity. He had no associated n/v, SOB or diaphones. The pain was immediately responsive to SL NTG. He receives most of his care at the [facility] in [place]. He had an abnormal NST in August 2013. He reports that he was scheduled to follow up at the [place] in [place] next week to discuss the test results and to plan for intervention. However, his pain yesterday was so severe, he called 911 and was brought to [facility] for evaluation.   Past Medical History  Diagnosis Date  . Hypertension   . Myocardial infarction   . Diabetes mellitus without complication   . Hyperlipidemia     Past Surgical History  Procedure Laterality Date  . Cardiac catheterization    . Coronary angioplasty    . Cholecystectomy      Family History  Problem Relation Age of Onset  . CAD Sister     Social History:  reports that he has been smoking.  He does not have any smokeless tobacco history on file. He reports that he does not drink alcohol. His  drug history is not on file.  Allergies: Not on File  Medications:  Prior to Admission:  No prescriptions prior to admission    Results for orders placed during the hospital encounter of 11/28/12 (from the past 48 hour(s))  GLUCOSE, CAPILLARY     Status: Abnormal   Collection Time    11/28/12  8:00 PM      Result Value Range   Glucose-Capillary 153 (*) 70 - 99 mg/dL  GLUCOSE, CAPILLARY     Status: Abnormal   Collection Time    11/28/12  9:28 PM      Result Value Range   Glucose-Capillary 144 (*) 70 - 99 mg/dL   Comment 1 Documented in Chart     Comment 2 Notify RN    TROPONIN I     Status: None   Collection Time    11/28/12 11:00 PM      Result Value Range   Troponin I <0.30  <0.30 ng/mL   Comment:            Due to the release kinetics of cTnI,     a negative result within the first hours     of the onset of symptoms does not rule out     myocardial infarction with certainty.     If myocardial infarction is still suspected,     repeat the test at appropriate  intervals.  COMPREHENSIVE METABOLIC PANEL     Status: Abnormal   Collection Time    11/28/12 11:00 PM      Result Value Range   Sodium 135  135 - 145 mEq/L   Potassium 3.8  3.5 - 5.1 mEq/L   Chloride 99  96 - 112 mEq/L   CO2 31  19 - 32 mEq/L   Glucose, Bld 161 (*) 70 - 99 mg/dL   BUN 7  6 - 23 mg/dL   Creatinine, Ser 1.61  0.50 - 1.35 mg/dL   Calcium 9.5  8.4 - 09.6 mg/dL   Total Protein 7.2  6.0 - 8.3 g/dL   Albumin 3.2 (*) 3.5 - 5.2 g/dL   AST 31  0 - 37 U/L   ALT 26  0 - 53 U/L   Alkaline Phosphatase 127 (*) 39 - 117 U/L   Total Bilirubin 0.3  0.3 - 1.2 mg/dL   GFR calc non Af Amer >90  >90 mL/min   GFR calc Af Amer >90  >90 mL/min   Comment:            The eGFR has been calculated     using the CKD EPI equation.     This calculation has not been     validated in all clinical     situations.     eGFR's persistently     <90 mL/min signify     possible Chronic Kidney Disease.  CBC WITH  DIFFERENTIAL     Status: Abnormal   Collection Time    11/28/12 11:00 PM      Result Value Range   WBC 6.2  4.0 - 10.5 K/uL   RBC 4.36  4.22 - 5.81 MIL/uL   Hemoglobin 14.7  13.0 - 17.0 g/dL   HCT 04.5  40.9 - 81.1 %   MCV 92.9  78.0 - 100.0 fL   MCH 33.7  26.0 - 34.0 pg   MCHC 36.3 (*) 30.0 - 36.0 g/dL   RDW 91.4  78.2 - 95.6 %   Platelets 136 (*) 150 - 400 K/uL   Neutrophils Relative 55  43 - 77 %   Neutro Abs 3.4  1.7 - 7.7 K/uL   Lymphocytes Relative 36  12 - 46 %   Lymphs Abs 2.2  0.7 - 4.0 K/uL   Monocytes Relative 7  3 - 12 %   Monocytes Absolute 0.5  0.1 - 1.0 K/uL   Eosinophils Relative 2  0 - 5 %   Eosinophils Absolute 0.1  0.0 - 0.7 K/uL   Basophils Relative 0  0 - 1 %   Basophils Absolute 0.0  0.0 - 0.1 K/uL  TROPONIN I     Status: None   Collection Time    11/29/12  5:45 AM      Result Value Range   Troponin I <0.30  <0.30 ng/mL   Comment:            Due to the release kinetics of cTnI,     a negative result within the first hours     of the onset of symptoms does not rule out     myocardial infarction with certainty.     If myocardial infarction is still suspected,     repeat the test at appropriate intervals.  BASIC METABOLIC PANEL     Status: Abnormal   Collection Time    11/29/12  5:45 AM      Result Value Range  Sodium 137  135 - 145 mEq/L   Potassium 3.7  3.5 - 5.1 mEq/L   Chloride 100  96 - 112 mEq/L   CO2 28  19 - 32 mEq/L   Glucose, Bld 186 (*) 70 - 99 mg/dL   BUN 7  6 - 23 mg/dL   Creatinine, Ser 1.61  0.50 - 1.35 mg/dL   Calcium 9.4  8.4 - 09.6 mg/dL   GFR calc non Af Amer >90  >90 mL/min   GFR calc Af Amer >90  >90 mL/min   Comment:            The eGFR has been calculated     using the CKD EPI equation.     This calculation has not been     validated in all clinical     situations.     eGFR's persistently     <90 mL/min signify     possible Chronic Kidney Disease.  CBC     Status: Abnormal   Collection Time    11/29/12  5:45 AM       Result Value Range   WBC 5.6  4.0 - 10.5 K/uL   RBC 4.24  4.22 - 5.81 MIL/uL   Hemoglobin 13.8  13.0 - 17.0 g/dL   HCT 04.5  40.9 - 81.1 %   MCV 92.2  78.0 - 100.0 fL   MCH 32.5  26.0 - 34.0 pg   MCHC 35.3  30.0 - 36.0 g/dL   RDW 91.4  78.2 - 95.6 %   Platelets 131 (*) 150 - 400 K/uL  GLUCOSE, CAPILLARY     Status: Abnormal   Collection Time    11/29/12  6:10 AM      Result Value Range   Glucose-Capillary 175 (*) 70 - 99 mg/dL    No results found.  Review of Systems  Constitutional: Negative for fever, chills, malaise/fatigue and diaphoresis.  Respiratory: Negative for shortness of breath.   Cardiovascular: Positive for chest pain, claudication and leg swelling. Negative for palpitations, orthopnea and PND.  Gastrointestinal: Negative for nausea, vomiting, abdominal pain, constipation, blood in stool and melena.  Genitourinary: Negative for hematuria.  Musculoskeletal: Positive for back pain (chronic).  Neurological: Negative for dizziness, loss of consciousness and weakness.   Blood pressure 96/55, pulse 67, temperature 97.2 F (36.2 C), temperature source Oral, resp. rate 18, height 5\' 10"  (1.778 m), weight 207 lb 12.8 oz (94.257 kg), SpO2 97.00%. Physical Exam  Constitutional: He is oriented to person, place, and time. He appears well-developed and well-nourished. No distress.  HENT:  Head: Normocephalic and atraumatic.  Eyes: Conjunctivae and EOM are normal. Pupils are equal, round, and reactive to light.  Neck: Normal range of motion. No JVD present.  Cardiovascular: Normal rate and regular rhythm.   Murmur (2/6 systolic) heard. Respiratory: Effort normal. No respiratory distress. He has wheezes (bilaterally R>L). He has no rales.  GI: Soft. Bowel sounds are normal. He exhibits no distension and no mass. There is no tenderness.  Musculoskeletal: He exhibits edema.  Lymphadenopathy:    He has no cervical adenopathy.  Neurological: He is alert and oriented to person,  place, and time.  Skin: Skin is warm and dry. He is not diaphoretic.  Psychiatric: He has a normal mood and affect. His behavior is normal.    Assessment/Plan: Principal Problem:   Chest pain Active Problems:   DIABETES MELLITUS   HYPERLIPIDEMIA   HYPERTENSION   CAD (coronary artery disease) of artery bypass  graft  Plan: EKG shows nonspecific ST and T wave changes. Troponin negative x 2. Currently no chest pain. Symptoms suggest unstable angina. He had an abnormal NST in Aug. 2013 that has not since been addressed.  Plan for cath today with Dr. Herbie Baltimore. Plan of right radial approach. Will place pre-cath orders. Keep NPO. Renal function is WNL w/ SCr of 0.69. HR and BP stable. Platelets are low at 131.    Allayne Butcher, PA-C 11/29/2012, 8:26 AM

## 2012-11-29 NOTE — Progress Notes (Addendum)
ANTICOAGULATION CONSULT NOTE - Initial Consult  Pharmacy Consult for heparin and integrilin Indication: unstable angina pending CABG s/p cath  Not on File  Patient Measurements: Height: 5\' 10"  (177.8 cm) Weight: 207 lb 12.8 oz (94.257 kg) IBW/kg (Calculated) : 73 Heparin Dosing Weight: 92.2 kg  Vital Signs: Temp: 97.7 F (36.5 C) (04/03 0944) Temp src: Oral (04/03 0944) BP: 112/55 mmHg (04/03 0944) Pulse Rate: 85 (04/03 0944)  Labs:  Recent Labs  11/28/12 2300 11/29/12 0545  HGB 14.7 13.8  HCT 40.5 39.1  PLT 136* 131*  CREATININE 0.66 0.69  TROPONINI <0.30 <0.30    Estimated Creatinine Clearance: 111.8 ml/min (by C-G formula based on Cr of 0.69).   Medical History: Past Medical History  Diagnosis Date  . Hypertension   . Myocardial infarction   . Diabetes mellitus without complication   . Hyperlipidemia     Medications:  Scheduled:  . aspirin      . [COMPLETED] aspirin  324 mg Oral Pre-Cath  . aspirin EC  325 mg Oral Daily  . [COMPLETED] cyclobenzaprine  10 mg Oral Once  . cyclobenzaprine  10 mg Oral Once  . [COMPLETED] fentaNYL      . [COMPLETED] heparin      . [COMPLETED] heparin      . insulin aspart  0-9 Units Subcutaneous TID WC  . [COMPLETED] lidocaine (PF)      . [COMPLETED] midazolam      . sodium chloride  3 mL Intravenous Q12H  . sodium chloride  3 mL Intravenous Q12H  . [COMPLETED] verapamil      . [DISCONTINUED] enoxaparin (LOVENOX) injection  40 mg Subcutaneous Q24H  . [DISCONTINUED] nitroGLYCERIN  0.4 mg Transdermal Daily  . [DISCONTINUED] sodium chloride  3 mL Intravenous Q12H   Infusions:  . sodium chloride    . nitroGLYCERIN    . [DISCONTINUED] sodium chloride 10 mL/hr at 11/28/12 2311    Assessment: 62 yo male s/p cath with old stent will be started on heparin 6 hrs post TR band removal; RN removed it at 1430.  Per MD, he also wants to start patient on integrilin now with a single bolus and run at a reduced rate of 1 mcg/kg/min  due to the unstable nature of his symptoms and the severity of his lesions.  Patient is pending CABG. Goal of Therapy:  Heparin level 0.3-0.5 units/ml Monitor platelets by anticoagulation protocol: Yes   Plan:  1) Start heparin at 1100 units/hr 6hrs post TR band removal, which will be 2030.  No bolus. 2) 6hr heparin level and CBC after drip has started which will be 0230 3) Start integrilin 180 mcg/kg, then 1 mcg/kg/min as requested by MD. 4) daily heparin level and CBC  Jonathan Corpus, Tsz-Yin 11/29/2012,1:01 PM

## 2012-11-30 ENCOUNTER — Inpatient Hospital Stay (HOSPITAL_COMMUNITY): Payer: Non-veteran care

## 2012-11-30 DIAGNOSIS — E669 Obesity, unspecified: Secondary | ICD-10-CM | POA: Diagnosis present

## 2012-11-30 DIAGNOSIS — I2510 Atherosclerotic heart disease of native coronary artery without angina pectoris: Secondary | ICD-10-CM

## 2012-11-30 DIAGNOSIS — Z01810 Encounter for preprocedural cardiovascular examination: Secondary | ICD-10-CM

## 2012-11-30 LAB — GLUCOSE, CAPILLARY
Glucose-Capillary: 161 mg/dL — ABNORMAL HIGH (ref 70–99)
Glucose-Capillary: 253 mg/dL — ABNORMAL HIGH (ref 70–99)

## 2012-11-30 LAB — BLOOD GAS, ARTERIAL
Acid-Base Excess: 0.9 mmol/L (ref 0.0–2.0)
Bicarbonate: 25 mEq/L — ABNORMAL HIGH (ref 20.0–24.0)
FIO2: 0.21 %
O2 Saturation: 94.9 %
Patient temperature: 98.6
TCO2: 26.2 mmol/L (ref 0–100)
pCO2 arterial: 39.8 mmHg (ref 35.0–45.0)
pH, Arterial: 7.415 (ref 7.350–7.450)
pO2, Arterial: 65.8 mmHg — ABNORMAL LOW (ref 80.0–100.0)

## 2012-11-30 LAB — HEPATIC FUNCTION PANEL
ALT: 22 U/L (ref 0–53)
AST: 27 U/L (ref 0–37)
Albumin: 3 g/dL — ABNORMAL LOW (ref 3.5–5.2)
Alkaline Phosphatase: 115 U/L (ref 39–117)
Bilirubin, Direct: <0.1 mg/dL (ref 0.0–0.3)
Total Bilirubin: 0.3 mg/dL (ref 0.3–1.2)
Total Protein: 6.7 g/dL (ref 6.0–8.3)

## 2012-11-30 LAB — BASIC METABOLIC PANEL
CO2: 28 mEq/L (ref 19–32)
Chloride: 102 mEq/L (ref 96–112)
Glucose, Bld: 135 mg/dL — ABNORMAL HIGH (ref 70–99)
Potassium: 3.6 mEq/L (ref 3.5–5.1)
Sodium: 137 mEq/L (ref 135–145)

## 2012-11-30 LAB — URINALYSIS, ROUTINE W REFLEX MICROSCOPIC
Bilirubin Urine: NEGATIVE
Glucose, UA: 100 mg/dL — AB
Hgb urine dipstick: NEGATIVE
Ketones, ur: NEGATIVE mg/dL
Leukocytes, UA: NEGATIVE
Nitrite: NEGATIVE
Protein, ur: NEGATIVE mg/dL
Specific Gravity, Urine: 1.018 (ref 1.005–1.030)
Urobilinogen, UA: >8 mg/dL — ABNORMAL HIGH (ref 0.0–1.0)
pH: 6.5 (ref 5.0–8.0)

## 2012-11-30 LAB — CBC
MCV: 92.3 fL (ref 78.0–100.0)
Platelets: 123 10*3/uL — ABNORMAL LOW (ref 150–400)
RBC: 4.18 MIL/uL — ABNORMAL LOW (ref 4.22–5.81)
WBC: 4.8 10*3/uL (ref 4.0–10.5)

## 2012-11-30 LAB — SURGICAL PCR SCREEN
MRSA, PCR: POSITIVE — AB
Staphylococcus aureus: POSITIVE — AB

## 2012-11-30 LAB — HEPARIN LEVEL (UNFRACTIONATED): Heparin Unfractionated: 0.17 IU/mL — ABNORMAL LOW (ref 0.30–0.70)

## 2012-11-30 MED ORDER — BUDESONIDE-FORMOTEROL FUMARATE 160-4.5 MCG/ACT IN AERO
2.0000 | INHALATION_SPRAY | Freq: Two times a day (BID) | RESPIRATORY_TRACT | Status: DC
  Administered 2012-11-30 – 2012-12-02 (×6): 2 via RESPIRATORY_TRACT
  Filled 2012-11-30: qty 6

## 2012-11-30 MED ORDER — MORPHINE SULFATE 4 MG/ML IJ SOLN
4.0000 mg | INTRAMUSCULAR | Status: DC | PRN
  Administered 2012-11-30 – 2012-12-01 (×3): 4 mg via INTRAVENOUS
  Filled 2012-11-30 (×3): qty 1

## 2012-11-30 MED ORDER — MUPIROCIN 2 % EX OINT
1.0000 "application " | TOPICAL_OINTMENT | Freq: Two times a day (BID) | CUTANEOUS | Status: AC
  Administered 2012-11-30 – 2012-12-04 (×9): 1 via NASAL
  Filled 2012-11-30 (×2): qty 22

## 2012-11-30 MED ORDER — CHLORHEXIDINE GLUCONATE CLOTH 2 % EX PADS
6.0000 | MEDICATED_PAD | Freq: Every day | CUTANEOUS | Status: AC
  Administered 2012-12-02 – 2012-12-05 (×5): 6 via TOPICAL

## 2012-11-30 MED ORDER — ALBUTEROL SULFATE (5 MG/ML) 0.5% IN NEBU
2.5000 mg | INHALATION_SOLUTION | Freq: Once | RESPIRATORY_TRACT | Status: AC
  Administered 2012-11-30: 2.5 mg via RESPIRATORY_TRACT

## 2012-11-30 NOTE — Progress Notes (Signed)
11/30/12.Marland KitchenMarland KitchenOletta Cohn, RN, BSN, [place] [phone] Pt is V.A.  Notified Sherren Kerns) [facility] [place], [place] [phone] of pt admission to [facility].  Pt PCP is Dr. Ena Dawley fax [phone]. Pt also admitted as self pay- [employer] notified.

## 2012-11-30 NOTE — Progress Notes (Signed)
ANTICOAGULATION CONSULT NOTE - Follow Up Consult  Pharmacy Consult for heparin and integrilin Indication: multivessel CAD s/p cath pending CABG  No Known Allergies  Patient Measurements: Height: 5\' 10"  (177.8 cm) Weight: 205 lb 0.4 oz (93 kg) IBW/kg (Calculated) : 73 Heparin Dosing Weight: 92.2 kg  Vital Signs: Temp: 97.9 F (36.6 C) (04/04 1546) Temp src: Oral (04/04 1546) BP: 153/75 mmHg (04/04 1546) Pulse Rate: 91 (04/04 1546)  Labs:  Recent Labs  11/28/12 2300 11/29/12 0545 11/29/12 1247 11/29/12 1248 11/29/12 2213 11/30/12 0239 11/30/12 1509  HGB 14.7 13.8  --   --   --  13.8  --   HCT 40.5 39.1  --   --   --  38.6*  --   PLT 136* 131*  --   --  128* 123*  --   LABPROT  --   --  13.8  --   --   --   --   INR  --   --  1.07  --   --   --   --   HEPARINUNFRC  --   --   --   --   --  0.11* 0.17*  CREATININE 0.66 0.69  --   --   --  0.61  --   TROPONINI <0.30 <0.30  --  <0.30  --  <0.30  --     Estimated Creatinine Clearance: 111.1 ml/min (by C-G formula based on Cr of 0.61).   Medications:  Scheduled:  . [COMPLETED] albuterol  2.5 mg Nebulization Once  . [EXPIRED] aspirin      . aspirin EC  325 mg Oral Daily  . atorvastatin  20 mg Oral q1800  . budesonide-formoterol  2 puff Inhalation BID  . [START ON 12/01/2012] Chlorhexidine Gluconate Cloth  6 each Topical Q0600  . cyclobenzaprine  10 mg Oral Once  . gabapentin  800 mg Oral TID  . insulin aspart  0-5 Units Subcutaneous QHS  . insulin aspart  0-9 Units Subcutaneous TID WC  . insulin detemir  45 Units Subcutaneous QHS  . insulin detemir  60 Units Subcutaneous Daily  . mupirocin ointment  1 application Nasal BID  . pantoprazole  40 mg Oral Q0600  . sodium chloride  3 mL Intravenous Q12H  . sodium chloride  3 mL Intravenous Q12H  . [DISCONTINUED] insulin aspart  0-9 Units Subcutaneous TID WC  . [DISCONTINUED] insulin detemir  45-60 Units Subcutaneous BID  . [DISCONTINUED] isosorbide mononitrate  60 mg  Oral Daily   Infusions:  . [EXPIRED] sodium chloride 0.106 mL/kg/hr (11/30/12 0200)  . eptifibatide 1 mcg/kg/min (11/30/12 1005)  . heparin 1,400 Units/hr (11/30/12 1554)  . nitroGLYCERIN 10 mcg/min (11/29/12 1430)    Assessment: 62 yo male with multivessel CAD s/p cath pending CABG is currently on subtherapeutic heparin.  Heparin level was 0.17.   Per RN, no problem with infusion. CBC stable.  Patient is also on integrelin; MD wants to keep heparin and integrelin for now. Goal of Therapy:  Heparin level 0.3-0.5 units/ml Monitor platelets by anticoagulation protocol: Yes   Plan:  1) Increase heparin to 1600 units/hr. Repeat 6hr heparin level. 2) Continue integrilin at 1 mcg/kg/min as requested by MD. 3) daily heparin level and CBC   Deni Berti, Tsz-Yin 11/30/2012,3:54 PM

## 2012-11-30 NOTE — Progress Notes (Signed)
Subjective:  No angina, his main complaint is back pain  Objective:  Vital Signs in the last 24 hours: Temp:  [97.3 F (36.3 C)-97.9 F (36.6 C)] 97.4 F (36.3 C) (04/04 0756) Pulse Rate:  [58-85] 73 (04/04 0756) Resp:  [16-19] 18 (04/04 0756) BP: (112-158)/(55-87) 126/74 mmHg (04/04 0756) SpO2:  [92 %-99 %] 99 % (04/04 0756) Weight:  [93 kg (205 lb 0.4 oz)] 93 kg (205 lb 0.4 oz) (04/04 0045)  Intake/Output from previous day:  Intake/Output Summary (Last 24 hours) at 11/30/12 0804 Last data filed at 11/30/12 0430  Gross per 24 hour  Intake 1319.3 ml  Output    700 ml  Net  619.3 ml    Physical Exam: General appearance: alert, cooperative, no distress and moderately obese Lungs: clear to auscultation bilaterally Heart: regular rate and rhythm, S1, S2 normal, no murmur, click, rub or gallop Abdomen: obese, large implalnted pump RUQ   Rate: 72  Rhythm: normal sinus rhythm  Lab Results:  Recent Labs  11/29/12 0545 11/29/12 2213 11/30/12 0239  WBC 5.6  --  4.8  HGB 13.8  --  13.8  PLT 131* 128* 123*    Recent Labs  11/29/12 0545 11/30/12 0239  NA 137 137  K 3.7 3.6  CL 100 102  CO2 28 28  GLUCOSE 186* 135*  BUN 7 7  CREATININE 0.69 0.61    Recent Labs  11/29/12 1248 11/30/12 0239  TROPONINI <0.30 <0.30   Hepatic Function Panel  Recent Labs  11/30/12 0239  PROT 6.7  ALBUMIN 3.0*  AST 27  ALT 22  ALKPHOS 115  BILITOT 0.3  BILIDIR <0.1  IBILI NOT CALCULATED    Recent Labs  11/29/12 0816  CHOL 109    Recent Labs  11/29/12 1247  INR 1.07    Imaging: Imaging results have been reviewed  Cardiac Studies:  Assessment/Plan:   Principal Problem:   Unstable angina  Active Problems:   CAD h/o BMS  PTCA (in 2001) of prox RCA; and PTCA followed by DES (Taxus 3.0 x 16 mm) to Circumflex (2004), now with severe 2V disease   DIABETES MELLITUS   BACK PAIN, CHRONIC- followed at pain clinic, he has an implantable MSO4 pump  HYPERLIPIDEMIA   HYPERTENSION   Tobacco abuse   Obesity, unspecified   Plan- Stable cardiac, he is on Heparin, Integrelin, NTG.Will discuss with pharmacy- OK to use MSO4 pump from home?. CVTS to see.   Corine Shelter PA-C 11/30/2012, 8:04 AM   I have seen and examined the patient along with Corine Shelter PA-C.  I have reviewed the chart, notes and new data.  I agree with PA's note.  Key new complaints: no angina or dyspnea at rest. Moderate to severe back pain Key examination changes: no clinical signs of CHF, no arrhythmia Key new findings / data: normal renal function, PFT and carotids pending  PLAN: CABG (after evaluation by CVTS). Keep on IV integrelin and heparin for now.  Thurmon Fair, MD, [facility] [phone] 11/30/2012, 8:55 AM

## 2012-11-30 NOTE — Progress Notes (Signed)
Pre-op Cardiac Surgery  Carotid Findings:  Bilateral:  No evidence of hemodynamically significant internal carotid artery stenosis.   Vertebral artery flow is antegrade.      Upper Extremity Right Left  Brachial Pressures 144 149  Radial Waveforms Tri Tri  Ulnar Waveforms Bi Tri  Palmar Arch (Allen's Test) Decreases with radial pressure but remains >50%. Normal with ulnar compression. Decreases with radial pressure but remains >50%. Normal with ulnar compression.   LE Findings:  Bilateral palpable pedal pulses.  Dylan Church, RDMS, RVT 11/30/2012

## 2012-11-30 NOTE — Progress Notes (Signed)
[address]            [place] [address]          [phone]       KHAN CHURA [facility] Medical Record #[id number] Date of Birth: [birth year]  No ref. provider found No PCP Per Patient  Chief Complaint:   No chief complaint on file.   History of Present Illness:     62 year old diabetic smoker with past history of drug-eluting stents placed to the circumflex and RCA presented with unstable angina and was found to have severe recurrent 2 vessel CAD with in-stent stenosis in the circumflex vessel. LV function is well preserved. 2-D echocardiogram shows no significant valvular disease. The patient has been stable on IV heparin and Integrilin without chest pain. He has a heavy smoking history but the patient's PFTs are only mildly abnormal. Carotid duplex shows no significant stenosis and ABIs are normal.  The patient has chronic venous stasis changes in his right lower leg which is usually swollen. We plan on harvesting vein from the left leg.   Current Activity/ Functional Status: Lives with his wife at home remains fairly active   Past Medical History  Diagnosis Date  . Hypertension   . Myocardial infarction   . Diabetes mellitus without complication   . Hyperlipidemia   . CAD (coronary artery disease), native coronary artery; h/o BMS with redo PTCA (in 2001) of prox RCA; and PTCA followed by DES (Taxus 3.0 x 16 mm) to Circumflex (2004) 11/28/2012  . Tobacco abuse 11/29/2012  . BACK PAIN, CHRONIC 02/08/2008    Qualifier: Diagnosis of  By: Koleen Distance CMA Duncan Dull), Hulan Saas      Past Surgical History  Procedure Laterality Date  . Cardiac catheterization    . Coronary angioplasty  2001    BMS to prox RCC in 1990s - Cutting Balloon PTCA in 2001 (2.75 mm)   . Cholecystectomy    . Percutaneous coronary stent intervention (pci-s)  2004    Taxus DES 3.0 mm and 16 mm.    History  Smoking status  . Current Some Day Smoker  Smokeless tobacco  .  Not on file    History  Alcohol Use No    History   Social History  . Marital Status: Married    Spouse Name: N/A    Number of Children: N/A  . Years of Education: N/A   Occupational History  . Not on file.   Social History Main Topics  . Smoking status: Current Some Day Smoker  . Smokeless tobacco: Not on file  . Alcohol Use: No  . Drug Use: Not on file  . Sexually Active: Not on file   Other Topics Concern  . Not on file   Social History Narrative  . No narrative on file    Family history-positive for diabetes positive for hypertension positive for CAD in his  sister.   No Known Allergies  Current Facility-Administered Medications  Medication Dose Route Frequency Provider Last Rate Last Dose  . 0.9 %  sodium chloride infusion  250 mL Intravenous PRN Marykay Lex, MD      . acetaminophen (TYLENOL) tablet 650 mg  650 mg Oral Q6H PRN Eduard Clos, MD       Or  . acetaminophen (TYLENOL) suppository 650 mg  650 mg Rectal Q6H PRN Eduard Clos, MD      .  ALPRAZolam Prudy Feeler) tablet 0.25 mg  0.25 mg Oral TID PRN Abelino Derrick, PA-C      . aspirin EC tablet 325 mg  325 mg Oral Daily Eduard Clos, MD   325 mg at 11/30/12 1000  . atorvastatin (LIPITOR) tablet 20 mg  20 mg Oral q1800 Wilburt Finlay, PA-C   20 mg at 11/29/12 1754  . budesonide-formoterol (SYMBICORT) 160-4.5 MCG/ACT inhaler 2 puff  2 puff Inhalation BID Kerin Perna, MD   2 puff at 11/30/12 2017  . [START ON 12/01/2012] Chlorhexidine Gluconate Cloth 2 % PADS 6 each  6 each Topical Q0600 Marykay Lex, MD      . cyclobenzaprine (FLEXERIL) tablet 10 mg  10 mg Oral Once Leda Gauze, NP      . eptifibatide (INTEGRILIN) 75 mg / 100 mL infusion  1 mcg/kg/min Intravenous Continuous Marykay Lex, MD 7.5 mL/hr at 11/30/12 1005 1 mcg/kg/min at 11/30/12 1005  . gabapentin (NEURONTIN) capsule 800 mg  800 mg Oral TID Wilburt Finlay, PA-C   800 mg at 11/30/12 1538  . heparin ADULT infusion 100  units/mL (25000 units/250 mL)  1,600 Units/hr Intravenous Continuous Marykay Lex, MD 16 mL/hr at 11/30/12 1600 1,600 Units/hr at 11/30/12 1600  . insulin aspart (novoLOG) injection 0-5 Units  0-5 Units Subcutaneous QHS Luke K Kilroy, PA-C      . insulin aspart (novoLOG) injection 0-9 Units  0-9 Units Subcutaneous TID WC Abelino Derrick, PA-C   3 Units at 11/30/12 1847  . insulin detemir (LEVEMIR) injection 45 Units  45 Units Subcutaneous QHS Marykay Lex, MD   45 Units at 11/29/12 2209  . insulin detemir (LEVEMIR) injection 60 Units  60 Units Subcutaneous Daily Marykay Lex, MD   60 Units at 11/30/12 1415  . morphine 4 MG/ML injection 4 mg  4 mg Intravenous Q4H PRN Abelino Derrick, PA-C   4 mg at 11/30/12 1005  . mupirocin ointment (BACTROBAN) 2 % 1 application  1 application Nasal BID Marykay Lex, MD   1 application at 11/30/12 1500  . nitroGLYCERIN 0.2 mg/mL in dextrose 5 % infusion  2-200 mcg/min Intravenous Continuous Marykay Lex, MD 3 mL/hr at 11/29/12 1430 10 mcg/min at 11/29/12 1430  . ondansetron (ZOFRAN) injection 4 mg  4 mg Intravenous Q6H PRN Marykay Lex, MD      . pantoprazole (PROTONIX) EC tablet 40 mg  40 mg Oral Q0600 Abelino Derrick, PA-C   40 mg at 11/30/12 1610  . sodium chloride 0.9 % injection 3 mL  3 mL Intravenous Q12H Eduard Clos, MD   3 mL at 11/28/12 2312  . sodium chloride 0.9 % injection 3 mL  3 mL Intravenous Q12H Marykay Lex, MD      . sodium chloride 0.9 % injection 3 mL  3 mL Intravenous PRN Marykay Lex, MD      . traMADol Janean Sark) tablet 50 mg  50 mg Oral Q6H PRN Abelino Derrick, PA-C      . zolpidem (AMBIEN) tablet 5 mg  5 mg Oral QHS PRN Abelino Derrick, PA-C         Family History  Problem Relation Age of Onset  . 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 The patient has chronic low back  pain and has an intrathecal morphine pump for the  past 8 years. The reservoir is filled every 3 months by his pain clinic physician in [place]. The patient was in the past 18 months underwent bronchoscopy for one episode of hemoptysis which was negative for cancer.      Cardiac Review of Systems: Y or N  Chest Pain [Y.    ]  Resting SOB [ N.  ] Exertional SOB  [ Y. ]  Orthopnea [Y.  ]   Pedal Edema [ YY  ]    Palpitations [Y.  ] Syncope  [N.  ]   Presyncope [  N. ]  General Review of Systems: [Y] = yes [  ]=no Constitional: recent weight change [  ]; anorexia [  ]; fatigue [  ]; nausea [  ]; night sweats [  ]; fever [  ]; or chills [  ];                                                                                                                                          Dental: poor dentition[  ]; Last Dentist visi 1 year  Eye : blurred vision [  ]; diplopia [   ]; vision changes [  ];  Amaurosis fugax[  ]; Resp: cough [  ];  wheezing[  ];  hemoptysis[  ]; shortness of breath[  ]; paroxysmal nocturnal dyspnea[  ]; dyspnea on exertion[  ]; or orthopnea[  ];  GI:  gallstones[  ], vomiting[  ];  dysphagia[  ]; melena[  ];  hematochezia [  ]; heartburn[  ];   Hx of  Colonoscopy[  ]; GU: kidney stones [  ]; hematuria[  ];   dysuria [  ];  nocturia[  ];  history of     obstruction [  ];                 Skin: rash, swelling[  ];, hair loss[Y.  ];  peripheral edema[  ];  or itching[  ]; Musculosketetal: myalgias[  ];  joint swelling[  ];  joint erythema[  ];  joint pain[  ];  back pain[ Y. ];  Heme/Lymph: bruising[  ];  bleeding[  ];  anemia[  ];  Neuro: TIA[  ];  headaches[  ];  stroke[  ];  vertigo[  ];  seizures[  ];   paresthesias[  ];  difficulty walking[  ];  Psych:depression[  ]; anxiety[  ];  Endocrine: diabetes[Y.  ];  thyroid dysfunction[  ];  Immunizations: Flu [  ]; Pneumococcal[  ];  Other:  Physical Exam: BP 149/69  Pulse 84  Temp(Src) 97.5 F (36.4 C) (Oral)  Resp 18  Ht 5\' 10"  (1.778 m)  Wt 205 lb 0.4 oz (93 kg)  BMI  29.42 kg/m2  SpO2 97%  General appearance-alert [place] and comfortable in his step  down unit HEENT normocephalic lungs equal dentition adequate Neck no JVD mass or bruit Lymphatics no palpable nodes in the neck Thorax clear no deformity or tenderness Cardiac regular rhythm soft 1-2/6 holosystolic murmur, no S3 gallop Abdomen obese soft right lower quadrant pain pump Extremities venous stasis changes right leg below the knee mild edema Vascular palpable distal pulses Neuro no focal deficit of motor function   Diagnostic Studies & Laboratory data:     Recent Radiology Findings:   Dg Chest 2 View  11/30/2012  *RADIOLOGY REPORT*  Clinical Data: Hypertension, back Pain, coronary disease  CHEST - 2 VIEW  Comparison: 11/28/2012  Findings: The heart and pulmonary vascularity are within normal limits.  The lungs are clear bilaterally.  No bony abnormality is seen.  IMPRESSION: No acute abnormality noted.  No change from prior exam.   Original Report Authenticated By: Alcide Clever, M.D.       Recent Lab Findings: Lab Results  Component Value Date   WBC 4.8 11/30/2012   HGB 13.8 11/30/2012   HCT 38.6* 11/30/2012   PLT 123* 11/30/2012   GLUCOSE 135* 11/30/2012   CHOL 109 11/29/2012   TRIG 200* 11/29/2012   HDL 21* 11/29/2012   LDLCALC 48 11/29/2012   ALT 22 11/30/2012   AST 27 11/30/2012   NA 137 11/30/2012   K 3.6 11/30/2012   CL 102 11/30/2012   CREATININE 0.61 11/30/2012   BUN 7 11/30/2012   CO2 28 11/30/2012   INR 1.07 11/29/2012   HGBA1C 11.6* 11/29/2012   Cardiac catheterization, 2-D echocardiogram, PFTs, carotid ultrasound, and chest x-ray all reviewed.   Assessment / Plan:      Poorly controlled diabetic he smokes and has previous PCI presents with unstable angina and fortunately preserved LV function. He is graftable targets to the LAD OM1 and distal circumflex and will be scheduled for CABG on April 7. Procedure risks and benefits discussed with patient.

## 2012-11-30 NOTE — Progress Notes (Signed)
ANTICOAGULATION CONSULT NOTE  Pharmacy Consult for heparin  Indication: multivessel CAD s/p cath  No Known Allergies  Patient Measurements: Height: 5\' 10"  (177.8 cm) Weight: 205 lb 0.4 oz (93 kg) IBW/kg (Calculated) : 73 Heparin Dosing Weight: 92.2 kg  Vital Signs: Temp: 97.5 F (36.4 C) (04/04 1900) Temp src: Oral (04/04 1900) BP: 149/69 mmHg (04/04 1900) Pulse Rate: 84 (04/04 1900)  Labs:  Recent Labs  11/28/12 2300 11/29/12 0545 11/29/12 1247 11/29/12 1248 11/29/12 2213 11/30/12 0239 11/30/12 1509 11/30/12 2209  HGB 14.7 13.8  --   --   --  13.8  --   --   HCT 40.5 39.1  --   --   --  38.6*  --   --   PLT 136* 131*  --   --  128* 123*  --   --   LABPROT  --   --  13.8  --   --   --   --   --   INR  --   --  1.07  --   --   --   --   --   HEPARINUNFRC  --   --   --   --   --  0.11* 0.17* 0.25*  CREATININE 0.66 0.69  --   --   --  0.61  --   --   TROPONINI <0.30 <0.30  --  <0.30  --  <0.30  --   --     Estimated Creatinine Clearance: 111.1 ml/min (by C-G formula based on Cr of 0.61).  Assessment: 62 yo male with CAD s/p cath, awaiting possible CABG, for heparin and Integrilin  Goal of Therapy:  Heparin level 0.3-0.5 units/ml Monitor platelets by anticoagulation protocol: Yes   Plan:  Increase Heparin 1800 units/hr Follow-up am labs.  Eddie Candle 11/30/2012,11:13 PM

## 2012-11-30 NOTE — Progress Notes (Signed)
Have been following pt to discuss pre-op ed however awaiting official TCTS consult. Will continue to follow.  Ethelda Chick CES, ACSM

## 2012-11-30 NOTE — Progress Notes (Signed)
I spoke with the pain clinic [phone]. The pt has a pump that dispenses a constant amount of MSO4 (3mg / day) intrathecally. This was just filled last week and lasts 3 months. The pt is able to give himself an extra 1mg  up to 3 X day. I will order an approximate equivalent IV prn dose for him, ( try 4mg  Q4hr prn IV)  LUKE KILROY PA-C 11/30/2012 8:53 AM

## 2012-11-30 NOTE — Progress Notes (Signed)
ANTICOAGULATION CONSULT NOTE  Pharmacy Consult for heparin and Integrilin Indication: multivessel CAD s/p cath  No Known Allergies  Patient Measurements: Height: 5\' 10"  (177.8 cm) Weight: 205 lb 0.4 oz (93 kg) IBW/kg (Calculated) : 73 Heparin Dosing Weight: 92.2 kg  Vital Signs: Temp: 97.9 F (36.6 C) (04/04 0045) Temp src: Oral (04/04 0045) BP: 132/67 mmHg (04/04 0045) Pulse Rate: 63 (04/04 0045)  Labs:  Recent Labs  11/28/12 2300 11/29/12 0545 11/29/12 1247 11/29/12 1248 11/29/12 2213 11/30/12 0239  HGB 14.7 13.8  --   --   --  13.8  HCT 40.5 39.1  --   --   --  38.6*  PLT 136* 131*  --   --  128* 123*  LABPROT  --   --  13.8  --   --   --   INR  --   --  1.07  --   --   --   HEPARINUNFRC  --   --   --   --   --  0.11*  CREATININE 0.66 0.69  --   --   --  0.61  TROPONINI <0.30 <0.30  --  <0.30  --  <0.30    Estimated Creatinine Clearance: 111.1 ml/min (by C-G formula based on Cr of 0.61).  Assessment: 62 yo male with CAD s/p cath, awaiting possible CABG, for heparin and Integrilin  Goal of Therapy:  Heparin level 0.3-0.5 units/ml Monitor platelets by anticoagulation protocol: Yes   Plan:  Increase Heparin 1400 units/hr Check heparin level in 8 hours.  Eddie Candle 11/30/2012,3:34 AM

## 2012-12-01 DIAGNOSIS — Z77098 Contact with and (suspected) exposure to other hazardous, chiefly nonmedicinal, chemicals: Secondary | ICD-10-CM | POA: Diagnosis present

## 2012-12-01 LAB — CBC
MCV: 93 fL (ref 78.0–100.0)
Platelets: 123 10*3/uL — ABNORMAL LOW (ref 150–400)
RBC: 4.12 MIL/uL — ABNORMAL LOW (ref 4.22–5.81)
RDW: 12.5 % (ref 11.5–15.5)
WBC: 5.2 10*3/uL (ref 4.0–10.5)

## 2012-12-01 LAB — HEPARIN LEVEL (UNFRACTIONATED): Heparin Unfractionated: 0.41 IU/mL (ref 0.30–0.70)

## 2012-12-01 LAB — APTT: aPTT: 90 seconds — ABNORMAL HIGH (ref 24–37)

## 2012-12-01 LAB — GLUCOSE, CAPILLARY: Glucose-Capillary: 180 mg/dL — ABNORMAL HIGH (ref 70–99)

## 2012-12-01 MED ORDER — INSULIN ASPART 100 UNIT/ML ~~LOC~~ SOLN
0.0000 [IU] | Freq: Three times a day (TID) | SUBCUTANEOUS | Status: DC
  Administered 2012-12-01: 8 [IU] via SUBCUTANEOUS
  Administered 2012-12-02: 15 [IU] via SUBCUTANEOUS
  Administered 2012-12-02: 5 [IU] via SUBCUTANEOUS

## 2012-12-01 MED ORDER — DIAZEPAM 5 MG PO TABS: 5.0000 mg | ORAL_TABLET | ORAL | Status: DC | PRN

## 2012-12-01 MED ORDER — INSULIN ASPART 100 UNIT/ML ~~LOC~~ SOLN
4.0000 [IU] | Freq: Three times a day (TID) | SUBCUTANEOUS | Status: DC
  Administered 2012-12-01 – 2012-12-02 (×3): 4 [IU] via SUBCUTANEOUS

## 2012-12-01 MED ORDER — INSULIN DETEMIR 100 UNIT/ML ~~LOC~~ SOLN
55.0000 [IU] | Freq: Every day | SUBCUTANEOUS | Status: DC
  Administered 2012-12-01: 55 [IU] via SUBCUTANEOUS
  Filled 2012-12-01 (×2): qty 0.55

## 2012-12-01 NOTE — Plan of Care (Signed)
Problem: Consults Goal: Diabetes Guidelines if Diabetic/Glucose > 140 If diabetic or lab glucose is > 140 mg/dl - Initiate Diabetes/Hyperglycemia Guidelines & Document Interventions  Outcome: Completed/Met Date Met:  12/01/12 Diabetes coordinator consulted per protocol for elevated HgA1C, CBG>200.

## 2012-12-01 NOTE — Progress Notes (Signed)
CARDIAC REHAB PHASE I   PRE:  Rate/Rhythm: 92SR  BP:  Supine:   Sitting: 162/127, 160/113  Standing:    SaO2:   MODE:  Ambulation: 400 ft   POST:  Rate/Rhythm: 104ST   BP:  Supine:   Sitting: 176/88, 170/104  Standing:    SaO2:  0900-0950 Pt tolerated ambulation well with minimal assistance. Pt BP was abnormally high, but pt was asymptomatic and felt fine. Completed education for pre-OHS. Pt stated that he stopped smoking 4 days ago, and feels better and is looking forward to living his life tobacco-free. Pt very anxious to get surgery, but knows this is very much needed. Pt will watch OHS video with family later on, and is currently using IS. Returned pt to side of bed with call light and phone within reach.  Deetta Perla

## 2012-12-01 NOTE — Progress Notes (Signed)
Subjective:  No chest pain. Good back pain control with MSO4 prn.  Objective:  Vital Signs in the last 24 hours: Temp:  [97.5 F (36.4 C)-98 F (36.7 C)] 97.9 F (36.6 C) (04/05 0727) Pulse Rate:  [68-91] 68 (04/05 0727) Resp:  [16-18] 16 (04/05 0727) BP: (107-172)/(54-97) 107/54 mmHg (04/05 0727) SpO2:  [94 %-99 %] 99 % (04/05 0727) Weight:  [93.7 kg (206 lb 9.1 oz)] 93.7 kg (206 lb 9.1 oz) (04/05 0555)  Intake/Output from previous day:  Intake/Output Summary (Last 24 hours) at 12/01/12 0824 Last data filed at 12/01/12 0300  Gross per 24 hour  Intake 1092.76 ml  Output    600 ml  Net 492.76 ml    Physical Exam: General appearance: alert, cooperative and no distress Lungs: clear to auscultation bilaterally Heart: regular rate and rhythm Rt groin without hematoma   Rate: 66  Rhythm: normal sinus rhythm  Lab Results:  Recent Labs  11/30/12 0239 12/01/12 0620  WBC 4.8 5.2  HGB 13.8 13.4  PLT 123* 123*    Recent Labs  11/29/12 0545 11/30/12 0239  NA 137 137  K 3.7 3.6  CL 100 102  CO2 28 28  GLUCOSE 186* 135*  BUN 7 7  CREATININE 0.69 0.61    Recent Labs  11/29/12 1248 11/30/12 0239  TROPONINI <0.30 <0.30   Hepatic Function Panel  Recent Labs  11/30/12 0239  PROT 6.7  ALBUMIN 3.0*  AST 27  ALT 22  ALKPHOS 115  BILITOT 0.3  BILIDIR <0.1  IBILI NOT CALCULATED    Recent Labs  11/29/12 0816  CHOL 109    Recent Labs  11/29/12 1247  INR 1.07    Imaging: Imaging results have been reviewed  Cardiac Studies:  Assessment/Plan:   Principal Problem:   Unstable angina Active Problems:   CAD h/o BMS  PTCA (in 2001) of prox RCA; and PTCA followed by DES (Taxus 3.0 x 16 mm) to Circumflex (2004), now with severe 2V disease   DIABETES MELLITUS   BACK PAIN, CHRONIC- followed at pain clinic, he has an implantable MSO4 pump   HYPERLIPIDEMIA   HYPERTENSION   Tobacco abuse   Obesity, unspecified   Agent orange exposure- on disabilty  from. [place] Vet '69-70  Plan- Tx to 2000, CABG Monday. Continue Integrelin?    Corine Shelter PA-C 12/01/2012, 8:24 AM   I have seen and examined the patient along with Corine Shelter PA-C.  I have reviewed the chart, notes and new data.  I agree with PA's note.  Key new complaints: breathing and coughing are much better Key examination changes: clear lungs, no wheezes, short 1/6 SEM in aortic focus Key new findings / data: PFTs and Carotid Dopplers completed. FEV1 77%, not too bad. Doppler report pending  PLAN: Continue integrelin through tomorrow morning. CABG Monday.  Thurmon Fair, MD, [facility] [phone] 12/01/2012, 8:50 AM

## 2012-12-01 NOTE — Progress Notes (Signed)
2 Days Post-Op Procedure(s) (LRB): LEFT HEART CATHETERIZATION WITH CORONARY ANGIOGRAM (N/A) Subjective: Diabetic smoker with 3-vessel CAD, PCR + for MRSA Blood sugars not controlled CABG Mon if DM better- rec diabetes coordinator Objective: Vital signs in last 24 hours: Temp:  [97.5 F (36.4 C)-98 F (36.7 C)] 97.8 F (36.6 C) (04/05 1322) Pulse Rate:  [68-91] 91 (04/05 1322) Cardiac Rhythm:  [-] Normal sinus rhythm (04/05 1237) Resp:  [16-18] 16 (04/05 1322) BP: (107-154)/(54-82) 130/70 mmHg (04/05 1322) SpO2:  [93 %-99 %] 93 % (04/05 1322) Weight:  [206 lb 9.1 oz (93.7 kg)] 206 lb 9.1 oz (93.7 kg) (04/05 0555)  Hemodynamic parameters for last 24 hours:  stable  Intake/Output from previous day: 04/04 0701 - 04/05 0700 In: 1092.8 [P.O.:520; I.V.:572.8] Out: 600 [Urine:600] Intake/Output this shift: Total I/O In: 240 [P.O.:240] Out: -     Lab Results:  Recent Labs  11/30/12 0239 12/01/12 0620  WBC 4.8 5.2  HGB 13.8 13.4  HCT 38.6* 38.3*  PLT 123* 123*   BMET:  Recent Labs  11/29/12 0545 11/30/12 0239  NA 137 137  K 3.7 3.6  CL 100 102  CO2 28 28  GLUCOSE 186* 135*  BUN 7 7  CREATININE 0.69 0.61  CALCIUM 9.4 9.3    PT/INR:  Recent Labs  11/29/12 1247  LABPROT 13.8  INR 1.07   ABG    Component Value Date/Time   PHART 7.415 11/30/2012 1700   HCO3 25.0* 11/30/2012 1700   TCO2 26.2 11/30/2012 1700   O2SAT 94.9 11/30/2012 1700   CBG (last 3)   Recent Labs  11/30/12 2143 12/01/12 0754 12/01/12 1112  GLUCAP 253* 180* 266*    Assessment/Plan: S/P Procedure(s) (LRB): LEFT HEART CATHETERIZATION WITH CORONARY ANGIOGRAM (N/A) CABG next week   LOS: 3 days    VAN TRIGT III,Rickell Wiehe 12/01/2012

## 2012-12-01 NOTE — Progress Notes (Signed)
ANTICOAGULATION CONSULT NOTE - Follow Up Consult  Pharmacy Consult for Heparin and Integrilin Indication: unstable angina s/p cath pending CABG on 4/7  No Known Allergies  Patient Measurements: Height: 5\' 10"  (177.8 cm) Weight: 206 lb 9.1 oz (93.7 kg) IBW/kg (Calculated) : 73 Heparin Dosing Weight: 92.2kg  Vital Signs: Temp: 97.9 F (36.6 C) (04/05 0727) Temp src: Oral (04/05 0727) BP: 107/54 mmHg (04/05 0727) Pulse Rate: 68 (04/05 0727)  Labs:  Recent Labs  11/28/12 2300 11/29/12 0545 11/29/12 1247 11/29/12 1248 11/29/12 2213  11/30/12 0239 11/30/12 1509 11/30/12 2209 12/01/12 0620  HGB 14.7 13.8  --   --   --   --  13.8  --   --  13.4  HCT 40.5 39.1  --   --   --   --  38.6*  --   --  38.3*  PLT 136* 131*  --   --  128*  --  123*  --   --  123*  APTT  --   --   --   --   --   --   --   --   --  90*  LABPROT  --   --  13.8  --   --   --   --   --   --   --   INR  --   --  1.07  --   --   --   --   --   --   --   HEPARINUNFRC  --   --   --   --   --   < > 0.11* 0.17* 0.25* 0.41  CREATININE 0.66 0.69  --   --   --   --  0.61  --   --   --   TROPONINI <0.30 <0.30  --  <0.30  --   --  <0.30  --   --   --   < > = values in this interval not displayed.  Estimated Creatinine Clearance: 111.5 ml/min (by C-G formula based on Cr of 0.61).   Medications:  Scheduled:  . [COMPLETED] albuterol  2.5 mg Nebulization Once  . aspirin EC  325 mg Oral Daily  . atorvastatin  20 mg Oral q1800  . budesonide-formoterol  2 puff Inhalation BID  . Chlorhexidine Gluconate Cloth  6 each Topical Q0600  . cyclobenzaprine  10 mg Oral Once  . gabapentin  800 mg Oral TID  . insulin aspart  0-5 Units Subcutaneous QHS  . insulin aspart  0-9 Units Subcutaneous TID WC  . insulin detemir  45 Units Subcutaneous QHS  . insulin detemir  60 Units Subcutaneous Daily  . mupirocin ointment  1 application Nasal BID  . pantoprazole  40 mg Oral Q0600  . sodium chloride  3 mL Intravenous Q12H  . sodium  chloride  3 mL Intravenous Q12H    Assessment: 62 y/o M s/p cath awaiting CABG on 4/7. With the nature of his symptoms, MD has requested patient continue on heparin and integrilin. CBC is stable, plts are low at 123 (were also 123 on 4/4, continue to monitor). CrCl >100. Heparin level this AM was 0.41 on 1800 units/hr of heparin. No overt bleeding noted.   Goal of Therapy:  Heparin level 0.3-0.5 Monitor platelets by anticoagulation protocol: Yes   Plan:  - Continue heparin drip at 1800 units/hr - Continue integrilin at 1 mcg/kg/min per MD - Daily CBC/HL, monitor platelets  - f/u CARDS  plans  Abran Duke, PharmD Clinical Pharmacist Phone: [phone] Pager: [phone] 12/01/2012 8:21 AM

## 2012-12-02 DIAGNOSIS — IMO0001 Reserved for inherently not codable concepts without codable children: Secondary | ICD-10-CM

## 2012-12-02 DIAGNOSIS — E1165 Type 2 diabetes mellitus with hyperglycemia: Secondary | ICD-10-CM

## 2012-12-02 LAB — CBC
HCT: 35.3 % — ABNORMAL LOW (ref 39.0–52.0)
HCT: 38.7 % — ABNORMAL LOW (ref 39.0–52.0)
Hemoglobin: 13.4 g/dL (ref 13.0–17.0)
MCH: 32.5 pg (ref 26.0–34.0)
MCH: 32.4 pg (ref 26.0–34.0)
MCHC: 34.6 g/dL (ref 30.0–36.0)
MCV: 93.1 fL (ref 78.0–100.0)
MCV: 93.7 fL (ref 78.0–100.0)
Platelets: 122 10*3/uL — ABNORMAL LOW (ref 150–400)
RBC: 4.13 MIL/uL — ABNORMAL LOW (ref 4.22–5.81)
RDW: 12.5 % (ref 11.5–15.5)
RDW: 12.3 % (ref 11.5–15.5)
WBC: 4.3 10*3/uL (ref 4.0–10.5)
WBC: 4.3 10*3/uL (ref 4.0–10.5)

## 2012-12-02 LAB — BASIC METABOLIC PANEL
BUN: 8 mg/dL (ref 6–23)
CO2: 29 mEq/L (ref 19–32)
Calcium: 9.7 mg/dL (ref 8.4–10.5)
Chloride: 101 mEq/L (ref 96–112)
Creatinine, Ser: 0.63 mg/dL (ref 0.50–1.35)
GFR calc Af Amer: >90 mL/min (ref >90)
GFR calc non Af Amer: >90 mL/min (ref >90)
Glucose, Bld: 301 mg/dL — ABNORMAL HIGH (ref 70–99)
Potassium: 4.1 mEq/L (ref 3.5–5.1)
Sodium: 136 mEq/L (ref 135–145)

## 2012-12-02 LAB — GLUCOSE, CAPILLARY
Glucose-Capillary: 242 mg/dL — ABNORMAL HIGH (ref 70–99)
Glucose-Capillary: 228 mg/dL — ABNORMAL HIGH (ref 70–99)
Glucose-Capillary: 312 mg/dL — ABNORMAL HIGH (ref 70–99)

## 2012-12-02 LAB — PREPARE RBC (CROSSMATCH)

## 2012-12-02 MED ORDER — TEMAZEPAM 15 MG PO CAPS: 15.0000 mg | ORAL_CAPSULE | Freq: Once | ORAL | Status: AC | PRN

## 2012-12-02 MED ORDER — NITROGLYCERIN IN D5W 200-5 MCG/ML-% IV SOLN
2.0000 ug/min | INTRAVENOUS | Status: AC
  Administered 2012-12-03: 10 ug/min via INTRAVENOUS
  Filled 2012-12-02: qty 250

## 2012-12-02 MED ORDER — ~~LOC~~ CARDIAC SURGERY, PATIENT & FAMILY EDUCATION
Freq: Once | Status: AC
  Administered 2012-12-02: 12:00:00
  Filled 2012-12-02: qty 1

## 2012-12-02 MED ORDER — PLASMA-LYTE 148 IV SOLN
INTRAVENOUS | Status: AC
  Administered 2012-12-03: 10:00:00
  Filled 2012-12-02: qty 2.5

## 2012-12-02 MED ORDER — POTASSIUM CHLORIDE 2 MEQ/ML IV SOLN
80.0000 meq | INTRAVENOUS | Status: DC
  Filled 2012-12-02: qty 40

## 2012-12-02 MED ORDER — CHLORHEXIDINE GLUCONATE 4 % EX LIQD
60.0000 mL | Freq: Once | CUTANEOUS | Status: AC
  Administered 2012-12-03: 4 via TOPICAL
  Filled 2012-12-02: qty 60

## 2012-12-02 MED ORDER — METOPROLOL TARTRATE 12.5 MG HALF TABLET
12.5000 mg | ORAL_TABLET | Freq: Once | ORAL | Status: DC
  Filled 2012-12-02: qty 1

## 2012-12-02 MED ORDER — SODIUM CHLORIDE 0.9 % IV SOLN
INTRAVENOUS | Status: DC
  Administered 2012-12-02: 18:00:00 via INTRAVENOUS
  Filled 2012-12-02: qty 1

## 2012-12-02 MED ORDER — DEXTROSE-NACL 5-0.45 % IV SOLN
INTRAVENOUS | Status: DC
  Administered 2012-12-02: 10 mL via INTRAVENOUS

## 2012-12-02 MED ORDER — SODIUM CHLORIDE 0.9 % IV SOLN
INTRAVENOUS | Status: AC
  Administered 2012-12-03: 2.9 [IU]/h via INTRAVENOUS
  Filled 2012-12-02: qty 1

## 2012-12-02 MED ORDER — DEXTROSE 50 % IV SOLN: 25.0000 mL | INTRAVENOUS | Status: DC | PRN

## 2012-12-02 MED ORDER — EPINEPHRINE HCL 1 MG/ML IJ SOLN
0.5000 ug/min | INTRAVENOUS | Status: DC
  Filled 2012-12-02: qty 4

## 2012-12-02 MED ORDER — BISACODYL 5 MG PO TBEC: 5.0000 mg | DELAYED_RELEASE_TABLET | Freq: Once | ORAL | Status: DC

## 2012-12-02 MED ORDER — HEPARIN (PORCINE) IN NACL 100-0.45 UNIT/ML-% IJ SOLN
2200.0000 [IU]/h | INTRAMUSCULAR | Status: DC
  Administered 2012-12-02: 2200 [IU]/h via INTRAVENOUS
  Filled 2012-12-02 (×4): qty 250

## 2012-12-02 MED ORDER — MAGNESIUM SULFATE 50 % IJ SOLN
40.0000 meq | INTRAMUSCULAR | Status: DC
  Filled 2012-12-02: qty 10

## 2012-12-02 MED ORDER — DEXMEDETOMIDINE HCL IN NACL 400 MCG/100ML IV SOLN
0.1000 ug/kg/h | INTRAVENOUS | Status: AC
  Administered 2012-12-03: 0.4 ug/kg/h via INTRAVENOUS
  Filled 2012-12-02: qty 100

## 2012-12-02 MED ORDER — VANCOMYCIN HCL 10 G IV SOLR
1500.0000 mg | INTRAVENOUS | Status: AC
  Administered 2012-12-03: 1500 mg via INTRAVENOUS
  Filled 2012-12-02: qty 1500

## 2012-12-02 MED ORDER — SODIUM CHLORIDE 0.9 % IV SOLN
INTRAVENOUS | Status: AC
  Administered 2012-12-03: 14 mL/h via INTRAVENOUS
  Filled 2012-12-02: qty 40

## 2012-12-02 MED ORDER — PHENYLEPHRINE HCL 10 MG/ML IJ SOLN
30.0000 ug/min | INTRAVENOUS | Status: AC
  Administered 2012-12-03: 20 ug/min via INTRAVENOUS
  Filled 2012-12-02: qty 2

## 2012-12-02 MED ORDER — CHLORHEXIDINE GLUCONATE 4 % EX LIQD
60.0000 mL | Freq: Once | CUTANEOUS | Status: DC
  Filled 2012-12-02: qty 60

## 2012-12-02 MED ORDER — DEXTROSE 5 % IV SOLN
1.5000 g | INTRAVENOUS | Status: AC
  Administered 2012-12-03: .75 g via INTRAVENOUS
  Administered 2012-12-03: 1.5 g via INTRAVENOUS
  Filled 2012-12-02: qty 1.5

## 2012-12-02 MED ORDER — DEXTROSE 5 % IV SOLN
750.0000 mg | INTRAVENOUS | Status: DC
  Filled 2012-12-02: qty 750

## 2012-12-02 MED ORDER — DOPAMINE-DEXTROSE 3.2-5 MG/ML-% IV SOLN
2.0000 ug/kg/min | INTRAVENOUS | Status: AC
  Administered 2012-12-03: 3 ug/kg/min via INTRAVENOUS
  Filled 2012-12-02: qty 250

## 2012-12-02 MED ORDER — SODIUM CHLORIDE 0.9 % IV SOLN
INTRAVENOUS | Status: DC
  Administered 2012-12-03: 10 mL via INTRAVENOUS

## 2012-12-02 NOTE — Progress Notes (Signed)
ANTICOAGULATION CONSULT NOTE - Follow Up Consult  Pharmacy Consult for Heparin and Integrilin Indication: unstable angina s/p cath pending CABG on 4/7  No Known Allergies  Patient Measurements: Height: 5\' 10"  (177.8 cm) Weight: 206 lb 9.1 oz (93.7 kg) IBW/kg (Calculated) : 73 Heparin Dosing Weight: 92.2kg  Vital Signs: Temp: 97.9 F (36.6 C) (04/05 2122) Temp src: Oral (04/05 2122) BP: 111/60 mmHg (04/05 2122) Pulse Rate: 90 (04/05 2149)  Labs:  Recent Labs  11/29/12 1247 11/29/12 1248  11/30/12 0239  11/30/12 2209 12/01/12 0620 12/02/12 0519  HGB  --   --   < > 13.8  --   --  13.4 12.3*  HCT  --   --   --  38.6*  --   --  38.3* 35.3*  PLT  --   --   < > 123*  --   --  123* 120*  APTT  --   --   --   --   --   --  90*  --   LABPROT 13.8  --   --   --   --   --   --   --   INR 1.07  --   --   --   --   --   --   --   HEPARINUNFRC  --   --   --  0.11*  < > 0.25* 0.41 0.20*  CREATININE  --   --   --  0.61  --   --   --   --   TROPONINI  --  <0.30  --  <0.30  --   --   --   --   < > = values in this interval not displayed.  Estimated Creatinine Clearance: 111.5 ml/min (by C-G formula based on Cr of 0.61).   Medications:  Scheduled:  . aspirin EC  325 mg Oral Daily  . atorvastatin  20 mg Oral q1800  . budesonide-formoterol  2 puff Inhalation BID  . Chlorhexidine Gluconate Cloth  6 each Topical Q0600  . cyclobenzaprine  10 mg Oral Once  . gabapentin  800 mg Oral TID  . insulin aspart  0-15 Units Subcutaneous TID WC  . insulin aspart  0-5 Units Subcutaneous QHS  . insulin aspart  4 Units Subcutaneous TID WC  . insulin detemir  55 Units Subcutaneous QHS  . mupirocin ointment  1 application Nasal BID  . pantoprazole  40 mg Oral Q0600  . sodium chloride  3 mL Intravenous Q12H  . sodium chloride  3 mL Intravenous Q12H  . [DISCONTINUED] insulin aspart  0-9 Units Subcutaneous TID WC  . [DISCONTINUED] insulin detemir  45 Units Subcutaneous QHS  . [DISCONTINUED] insulin  detemir  60 Units Subcutaneous Daily    Assessment: 62 y/o M s/p cath awaiting CABG on 4/7. With the nature of his symptoms, MD has requested patient continue on heparin and integrilin.  Heparin level (0.2) is below-goal on 1800 units/hr. No problem with line / infusion and no bleeding per RN. Per 4/5 cardiology note, plan to continue Integrilin through Sunday morning.   Goal of Therapy:  Heparin level 0.3-0.5 Monitor platelets by anticoagulation protocol: Yes   Plan:  1. Increase IV heparin to 2050 units/hr.  2. Heparin level in 6 hours.  3. Continue Integrilin at 1 mcg/kg/min per MD 4. Follow-up Integrilin plan.    Lorre Munroe, PharmD  12/02/2012 6:05 AM

## 2012-12-02 NOTE — Progress Notes (Signed)
3 Days Post-Op Procedure(s) (LRB): LEFT HEART CATHETERIZATION WITH CORONARY ANGIOGRAM (N/A) Subjective: Blood glucose remains uncontrolled- unacceptable for CABG Will start iv glucose stabilizer and if blood glucose comes under control by am proceed with CABG  Objective: Vital signs in last 24 hours: Temp:  [97.4 F (36.3 C)-98.8 F (37.1 C)] 98.8 F (37.1 C) (04/06 1410) Pulse Rate:  [71-91] 79 (04/06 1410) Cardiac Rhythm:  [-] Sinus tachycardia (04/06 0950) Resp:  [16-17] 17 (04/06 0628) BP: (108-114)/(60-63) 114/62 mmHg (04/06 1410) SpO2:  [95 %-100 %] 99 % (04/06 1410)  Hemodynamic parameters for last 24 hours:  nsr  Intake/Output from previous day: 04/05 0701 - 04/06 0700 In: 480 [P.O.:480] Out: -  Intake/Output this shift:    No hematoma  Lab Results:  Recent Labs  12/02/12 0519 12/02/12 1345  WBC 4.3 4.3  HGB 12.3* 13.4  HCT 35.3* 38.7*  PLT 120* 122*   BMET:  Recent Labs  11/30/12 0239 12/02/12 1345  NA 137 136  K 3.6 4.1  CL 102 101  CO2 28 29  GLUCOSE 135* 301*  BUN 7 8  CREATININE 0.61 0.63  CALCIUM 9.3 9.7    PT/INR: No results found for this basename: LABPROT, INR,  in the last 72 hours ABG    Component Value Date/Time   PHART 7.415 11/30/2012 1700   HCO3 25.0* 11/30/2012 1700   TCO2 26.2 11/30/2012 1700   O2SAT 94.9 11/30/2012 1700   CBG (last 3)   Recent Labs  12/01/12 2124 12/02/12 0631 12/02/12 1117  GLUCAP 242* 228* 365*    Assessment/Plan: S/P Procedure(s) (LRB): LEFT HEART CATHETERIZATION WITH CORONARY ANGIOGRAM (N/A) Blood glucose control then CABG   LOS: 4 days    VAN TRIGT III,PETER 12/02/2012

## 2012-12-02 NOTE — Progress Notes (Signed)
ANTICOAGULATION CONSULT NOTE - Follow Up Consult  Pharmacy Consult for Heparin Indication: Unstable angina s/p cath. Pending CABG on 4/7.  No Known Allergies  Patient Measurements: Height: 5\' 10"  (177.8 cm) Weight: 206 lb 9.1 oz (93.7 kg) IBW/kg (Calculated) : 73 Heparin Dosing Weight:   Vital Signs: Temp: 97.4 F (36.3 C) (04/06 0628) Temp src: Oral (04/06 0628) BP: 108/63 mmHg (04/06 0628) Pulse Rate: 71 (04/06 0628)  Labs:  Recent Labs  11/30/12 0239  11/30/12 2209 12/01/12 0620 12/02/12 0519  HGB 13.8  --   --  13.4 12.3*  HCT 38.6*  --   --  38.3* 35.3*  PLT 123*  --   --  123* 120*  APTT  --   --   --  90*  --   HEPARINUNFRC 0.11*  < > 0.25* 0.41 0.20*  CREATININE 0.61  --   --   --   --   TROPONINI <0.30  --   --   --   --   < > = values in this interval not displayed.  Estimated Creatinine Clearance: 111.5 ml/min (by C-G formula based on Cr of 0.61).   Medications:  Scheduled:  . aspirin EC  325 mg Oral Daily  . atorvastatin  20 mg Oral q1800  . bisacodyl  5 mg Oral Once  . budesonide-formoterol  2 puff Inhalation BID  . [START ON 12/03/2012] chlorhexidine  60 mL Topical Once  . [START ON 12/03/2012] chlorhexidine  60 mL Topical Once  . Chlorhexidine Gluconate Cloth  6 each Topical Q0600  . cyclobenzaprine  10 mg Oral Once  . gabapentin  800 mg Oral TID  . going for heart surgery book   Does not apply Once  . insulin aspart  0-15 Units Subcutaneous TID WC  . insulin aspart  0-5 Units Subcutaneous QHS  . insulin aspart  4 Units Subcutaneous TID WC  . insulin detemir  55 Units Subcutaneous QHS  . [START ON 12/04/2012] metoprolol tartrate  12.5 mg Oral Once  . mupirocin ointment  1 application Nasal BID  . pantoprazole  40 mg Oral Q0600  . sodium chloride  3 mL Intravenous Q12H  . sodium chloride  3 mL Intravenous Q12H  . [DISCONTINUED] insulin aspart  0-9 Units Subcutaneous TID WC  . [DISCONTINUED] insulin detemir  45 Units Subcutaneous QHS  .  [DISCONTINUED] insulin detemir  60 Units Subcutaneous Daily    Assessment: Heparin level was 0.20 on Heparin 2050 units/hr.  Will increase rate to 2200 units/hr. Pt for OHS tomorrow (4/7)  Integrilin has been d/c'd. Goal of Therapy:  Heparin level 0.3- 0.7 Monitor platelets by anticoagulation protocol: Yes   Plan:  Integrilin has been d/c's 1) increase heparin to 2200 units/hr 2 ) check hepairn level in 6 hrs 3 ) For OHS 4/7  Eugene Garnet 12/02/2012,2:09 PM

## 2012-12-02 NOTE — Progress Notes (Signed)
Subjective:  No chest pain  Objective:  Vital Signs in the last 24 hours: Temp:  [97.4 F (36.3 C)-97.9 F (36.6 C)] 97.4 F (36.3 C) (04/06 0628) Pulse Rate:  [71-91] 71 (04/06 0628) Resp:  [16-17] 17 (04/06 0628) BP: (108-130)/(60-70) 108/63 mmHg (04/06 0628) SpO2:  [93 %-100 %] 98 % (04/06 0814)  Intake/Output from previous day:  Intake/Output Summary (Last 24 hours) at 12/02/12 1057 Last data filed at 12/01/12 1859  Gross per 24 hour  Intake    480 ml  Output      0 ml  Net    480 ml    Physical Exam: General appearance: alert, cooperative and no distress Lungs: clear to auscultation bilaterally Heart: regular rate and rhythm   Rate: 72  Rhythm: normal sinus rhythm  Lab Results:  Recent Labs  12/01/12 0620 12/02/12 0519  WBC 5.2 4.3  HGB 13.4 12.3*  PLT 123* 120*    Recent Labs  11/30/12 0239  NA 137  K 3.6  CL 102  CO2 28  GLUCOSE 135*  BUN 7  CREATININE 0.61    Recent Labs  11/29/12 1248 11/30/12 0239  TROPONINI <0.30 <0.30   Hepatic Function Panel  Recent Labs  11/30/12 0239  PROT 6.7  ALBUMIN 3.0*  AST 27  ALT 22  ALKPHOS 115  BILITOT 0.3  BILIDIR <0.1  IBILI NOT CALCULATED   No results found for this basename: CHOL,  in the last 72 hours  Recent Labs  11/29/12 1247  INR 1.07    Imaging: Imaging results have been reviewed  Cardiac Studies:  Assessment/Plan:   Principal Problem:   Unstable angina Active Problems:   CAD h/o BMS  PTCA (in 2001) of prox RCA; and PTCA followed by DES (Taxus 3.0 x 16 mm) to Circumflex (2004), now with severe 2V disease   DIABETES MELLITUS   BACK PAIN, CHRONIC- followed at pain clinic, he has an implantable MSO4 pump   HYPERLIPIDEMIA   HYPERTENSION   Tobacco abuse   Obesity, unspecified   Agent orange exposure- on disabilty from. [place] Vet '69-70  Plan- CABG in am. Will stop Integrelin as mentioned by Dr Royann Shivers.  Corine Shelter PA-C 12/02/2012, 10:57 AM   I have seen and  examined the patient along with Corine Shelter PA-C.  I have reviewed the chart, notes and new data.  I agree with PA's note.  Key new complaints: angina free, less coughing and breathing better Key examination changes: no clinical HF or arrhythmia  PLAN: DC Integrelin, CABG in AM.  Thurmon Fair, MD, [facility] [phone] 12/02/2012, 11:30 AM

## 2012-12-03 ENCOUNTER — Inpatient Hospital Stay (HOSPITAL_COMMUNITY): Payer: Non-veteran care

## 2012-12-03 ENCOUNTER — Inpatient Hospital Stay (HOSPITAL_COMMUNITY): Payer: Non-veteran care | Admitting: Anesthesiology

## 2012-12-03 ENCOUNTER — Encounter (HOSPITAL_COMMUNITY)
Admission: AD | Disposition: A | Discharge status: Home / Self Care | Payer: Self-pay | Source: Other Acute Inpatient Hospital | Attending: Cardiology

## 2012-12-03 ENCOUNTER — Encounter (HOSPITAL_COMMUNITY): Payer: Self-pay | Admitting: Anesthesiology

## 2012-12-03 DIAGNOSIS — I2510 Atherosclerotic heart disease of native coronary artery without angina pectoris: Secondary | ICD-10-CM

## 2012-12-03 HISTORY — PX: CORONARY ARTERY BYPASS GRAFT: SHX141

## 2012-12-03 HISTORY — PX: INTRAOPERATIVE TRANSESOPHAGEAL ECHOCARDIOGRAM: SHX5062

## 2012-12-03 LAB — POCT I-STAT, CHEM 8
BUN: 4 mg/dL — ABNORMAL LOW (ref 6–23)
Calcium, Ion: 1.27 mmol/L (ref 1.13–1.30)
HCT: 32 % — ABNORMAL LOW (ref 39.0–52.0)
Hemoglobin: 10.9 g/dL — ABNORMAL LOW (ref 13.0–17.0)
Sodium: 140 mEq/L (ref 135–145)
TCO2: 29 mmol/L (ref 0–100)

## 2012-12-03 LAB — POCT I-STAT 3, ART BLOOD GAS (G3+)
Acid-Base Excess: 1 mmol/L (ref 0.0–2.0)
Acid-Base Excess: 1 mmol/L (ref 0.0–2.0)
Acid-Base Excess: 1 mmol/L (ref 0.0–2.0)
Bicarbonate: 27.4 mEq/L — ABNORMAL HIGH (ref 20.0–24.0)
O2 Saturation: 100 %
O2 Saturation: 100 %
O2 Saturation: 95 %
Patient temperature: 36.9
TCO2: 28 mmol/L (ref 0–100)
pCO2 arterial: 56.6 mmHg — ABNORMAL HIGH (ref 35.0–45.0)
pCO2 arterial: 50.3 mmHg — ABNORMAL HIGH (ref 35.0–45.0)
pCO2 arterial: 50.3 mmHg — ABNORMAL HIGH (ref 35.0–45.0)
pH, Arterial: 7.288 — ABNORMAL LOW (ref 7.350–7.450)
pH, Arterial: 7.355 (ref 7.350–7.450)
pO2, Arterial: 90 mmHg (ref 80.0–100.0)

## 2012-12-03 LAB — BASIC METABOLIC PANEL
BUN: 7 mg/dL (ref 6–23)
CO2: 30 mEq/L (ref 19–32)
Calcium: 9.4 mg/dL (ref 8.4–10.5)
Chloride: 103 mEq/L (ref 96–112)
Creatinine, Ser: 0.58 mg/dL (ref 0.50–1.35)
GFR calc Af Amer: >90 mL/min (ref >90)
GFR calc non Af Amer: >90 mL/min (ref >90)
Glucose, Bld: 151 mg/dL — ABNORMAL HIGH (ref 70–99)
Potassium: 4.1 mEq/L (ref 3.5–5.1)
Sodium: 139 mEq/L (ref 135–145)

## 2012-12-03 LAB — CREATININE, SERUM
Creatinine, Ser: 0.54 mg/dL (ref 0.50–1.35)
GFR calc Af Amer: >90 mL/min (ref >90)
GFR calc non Af Amer: >90 mL/min (ref >90)

## 2012-12-03 LAB — POCT I-STAT 4, (NA,K, GLUC, HGB,HCT)
Glucose, Bld: 142 mg/dL — ABNORMAL HIGH (ref 70–99)
Glucose, Bld: 223 mg/dL — ABNORMAL HIGH (ref 70–99)
HCT: 36 % — ABNORMAL LOW (ref 39.0–52.0)
HCT: 33 % — ABNORMAL LOW (ref 39.0–52.0)
Hemoglobin: 12.2 g/dL — ABNORMAL LOW (ref 13.0–17.0)
Hemoglobin: 9.9 g/dL — ABNORMAL LOW (ref 13.0–17.0)
Hemoglobin: 11.2 g/dL — ABNORMAL LOW (ref 13.0–17.0)
Potassium: 3.9 mEq/L (ref 3.5–5.1)
Potassium: 3.8 mEq/L (ref 3.5–5.1)
Sodium: 140 mEq/L (ref 135–145)
Sodium: 138 mEq/L (ref 135–145)
Sodium: 139 mEq/L (ref 135–145)
Sodium: 139 mEq/L (ref 135–145)

## 2012-12-03 LAB — GLUCOSE, CAPILLARY
Glucose-Capillary: 109 mg/dL — ABNORMAL HIGH (ref 70–99)
Glucose-Capillary: 141 mg/dL — ABNORMAL HIGH (ref 70–99)
Glucose-Capillary: 156 mg/dL — ABNORMAL HIGH (ref 70–99)
Glucose-Capillary: 157 mg/dL — ABNORMAL HIGH (ref 70–99)
Glucose-Capillary: 160 mg/dL — ABNORMAL HIGH (ref 70–99)
Glucose-Capillary: 167 mg/dL — ABNORMAL HIGH (ref 70–99)
Glucose-Capillary: 190 mg/dL — ABNORMAL HIGH (ref 70–99)
Glucose-Capillary: 214 mg/dL — ABNORMAL HIGH (ref 70–99)
Glucose-Capillary: 92 mg/dL (ref 70–99)
Glucose-Capillary: 97 mg/dL (ref 70–99)

## 2012-12-03 LAB — CBC
HCT: 33 % — ABNORMAL LOW (ref 39.0–52.0)
Hemoglobin: 10.8 g/dL — ABNORMAL LOW (ref 13.0–17.0)
Hemoglobin: 11.9 g/dL — ABNORMAL LOW (ref 13.0–17.0)
MCH: 32.8 pg (ref 26.0–34.0)
MCH: 32.2 pg (ref 26.0–34.0)
MCH: 33.9 pg (ref 26.0–34.0)
MCHC: 35.3 g/dL (ref 30.0–36.0)
MCHC: 36.1 g/dL — ABNORMAL HIGH (ref 30.0–36.0)
MCV: 94 fL (ref 78.0–100.0)
Platelets: 122 10*3/uL — ABNORMAL LOW (ref 150–400)
Platelets: 113 10*3/uL — ABNORMAL LOW (ref 150–400)
RBC: 3.35 MIL/uL — ABNORMAL LOW (ref 4.22–5.81)
RBC: 3.51 MIL/uL — ABNORMAL LOW (ref 4.22–5.81)
RDW: 12.7 % (ref 11.5–15.5)
WBC: 12 10*3/uL — ABNORMAL HIGH (ref 4.0–10.5)

## 2012-12-03 LAB — HEMOGLOBIN AND HEMATOCRIT, BLOOD
HCT: 27.4 % — ABNORMAL LOW (ref 39.0–52.0)
Hemoglobin: 9.8 g/dL — ABNORMAL LOW (ref 13.0–17.0)

## 2012-12-03 LAB — PLATELET COUNT: Platelets: 131 10*3/uL — ABNORMAL LOW (ref 150–400)

## 2012-12-03 LAB — PROTIME-INR: Prothrombin Time: 15.4 seconds — ABNORMAL HIGH (ref 11.6–15.2)

## 2012-12-03 LAB — MAGNESIUM: Magnesium: 2.7 mg/dL — ABNORMAL HIGH (ref 1.5–2.5)

## 2012-12-03 SURGERY — CORONARY ARTERY BYPASS GRAFTING (CABG)
Anesthesia: General | Site: Throat | Wound class: Clean

## 2012-12-03 MED ORDER — ACETAMINOPHEN 10 MG/ML IV SOLN
1000.0000 mg | Freq: Once | INTRAVENOUS | Status: AC
  Administered 2012-12-03: 1000 mg via INTRAVENOUS
  Filled 2012-12-03: qty 100

## 2012-12-03 MED ORDER — INSULIN REGULAR BOLUS VIA INFUSION
0.0000 [IU] | Freq: Three times a day (TID) | INTRAVENOUS | Status: AC
  Administered 2012-12-04: 2.4 [IU] via INTRAVENOUS
  Administered 2012-12-04: 3.5 [IU] via INTRAVENOUS
  Administered 2012-12-04: 1.9 [IU] via INTRAVENOUS
  Administered 2012-12-05: 8 [IU] via INTRAVENOUS
  Filled 2012-12-03: qty 10

## 2012-12-03 MED ORDER — SODIUM CHLORIDE 0.9 % IV SOLN
INTRAVENOUS | Status: DC
  Administered 2012-12-03: 15:00:00 via INTRAVENOUS

## 2012-12-03 MED ORDER — LEVALBUTEROL TARTRATE 45 MCG/ACT IN AERO
2.0000 | INHALATION_SPRAY | Freq: Four times a day (QID) | RESPIRATORY_TRACT | Status: DC
  Administered 2012-12-03 – 2012-12-07 (×14): 2 via RESPIRATORY_TRACT
  Filled 2012-12-03: qty 15

## 2012-12-03 MED ORDER — METOPROLOL TARTRATE 25 MG/10 ML ORAL SUSPENSION
12.5000 mg | Freq: Two times a day (BID) | ORAL | Status: DC
Start: 2012-12-03 — End: 2012-12-05
  Administered 2012-12-03: 12.5 mg
  Filled 2012-12-03 (×5): qty 5

## 2012-12-03 MED ORDER — SODIUM CHLORIDE 0.9 % IV SOLN: 250.0000 mL | INTRAVENOUS | Status: DC

## 2012-12-03 MED ORDER — ACETAMINOPHEN 500 MG PO TABS
1000.0000 mg | ORAL_TABLET | Freq: Four times a day (QID) | ORAL | Status: DC
  Administered 2012-12-03 – 2012-12-07 (×13): 1000 mg via ORAL
  Filled 2012-12-03 (×22): qty 2

## 2012-12-03 MED ORDER — ASPIRIN EC 325 MG PO TBEC
325.0000 mg | DELAYED_RELEASE_TABLET | Freq: Every day | ORAL | Status: DC
  Administered 2012-12-04 – 2012-12-07 (×4): 325 mg via ORAL
  Filled 2012-12-03 (×4): qty 1

## 2012-12-03 MED ORDER — SODIUM CHLORIDE 0.9 % IV SOLN
INTRAVENOUS | Status: AC
  Administered 2012-12-03: 11 [IU]/h via INTRAVENOUS
  Administered 2012-12-04: 13.3 [IU]/h via INTRAVENOUS
  Administered 2012-12-04: 14.6 [IU]/h via INTRAVENOUS
  Filled 2012-12-03 (×3): qty 1

## 2012-12-03 MED ORDER — PHENYLEPHRINE HCL 10 MG/ML IJ SOLN
0.0000 ug/min | INTRAVENOUS | Status: DC
  Filled 2012-12-03: qty 2

## 2012-12-03 MED ORDER — LACTATED RINGERS IV SOLN: INTRAVENOUS | Status: DC

## 2012-12-03 MED ORDER — HEPARIN SODIUM (PORCINE) 1000 UNIT/ML IJ SOLN
INTRAMUSCULAR | Status: DC | PRN
  Administered 2012-12-03: 5000 [IU] via INTRAVENOUS
  Administered 2012-12-03: 25000 [IU] via INTRAVENOUS

## 2012-12-03 MED ORDER — BUDESONIDE-FORMOTEROL FUMARATE 160-4.5 MCG/ACT IN AERO
2.0000 | INHALATION_SPRAY | Freq: Two times a day (BID) | RESPIRATORY_TRACT | Status: DC
  Administered 2012-12-03 – 2012-12-07 (×8): 2 via RESPIRATORY_TRACT
  Filled 2012-12-03: qty 6

## 2012-12-03 MED ORDER — NITROGLYCERIN IN D5W 200-5 MCG/ML-% IV SOLN: 0.0000 ug/min | INTRAVENOUS | Status: DC

## 2012-12-03 MED ORDER — OXYCODONE HCL 5 MG PO TABS
5.0000 mg | ORAL_TABLET | ORAL | Status: DC | PRN
  Administered 2012-12-03 (×2): 5 mg via ORAL
  Administered 2012-12-04 (×2): 10 mg via ORAL
  Administered 2012-12-04: 5 mg via ORAL
  Administered 2012-12-04 – 2012-12-06 (×10): 10 mg via ORAL
  Filled 2012-12-03: qty 2
  Filled 2012-12-03: qty 1
  Filled 2012-12-03 (×9): qty 2
  Filled 2012-12-03 (×2): qty 1
  Filled 2012-12-03 (×2): qty 2

## 2012-12-03 MED ORDER — MORPHINE SULFATE 2 MG/ML IJ SOLN
1.0000 mg | INTRAMUSCULAR | Status: AC | PRN
  Administered 2012-12-03 (×3): 2 mg via INTRAVENOUS
  Filled 2012-12-03: qty 1
  Filled 2012-12-03: qty 2
  Filled 2012-12-03: qty 1

## 2012-12-03 MED ORDER — ACETAMINOPHEN 160 MG/5ML PO SOLN
975.0000 mg | Freq: Four times a day (QID) | ORAL | Status: DC
  Filled 2012-12-03: qty 40.6

## 2012-12-03 MED ORDER — ASPIRIN 81 MG PO CHEW: 324.0000 mg | CHEWABLE_TABLET | Freq: Every day | ORAL | Status: DC

## 2012-12-03 MED ORDER — PANTOPRAZOLE SODIUM 40 MG PO TBEC
40.0000 mg | DELAYED_RELEASE_TABLET | Freq: Every day | ORAL | Status: DC
  Administered 2012-12-05 – 2012-12-07 (×3): 40 mg via ORAL
  Filled 2012-12-03 (×3): qty 1

## 2012-12-03 MED ORDER — 0.9 % SODIUM CHLORIDE (POUR BTL) OPTIME
TOPICAL | Status: DC | PRN
  Administered 2012-12-03: 6000 mL

## 2012-12-03 MED ORDER — METOPROLOL TARTRATE 1 MG/ML IV SOLN
INTRAVENOUS | Status: DC | PRN
  Administered 2012-12-03: 3 mg via INTRAVENOUS
  Administered 2012-12-03: 2 mg via INTRAVENOUS

## 2012-12-03 MED ORDER — MAGNESIUM SULFATE 40 MG/ML IJ SOLN
4.0000 g | Freq: Once | INTRAMUSCULAR | Status: AC
  Administered 2012-12-03: 4 g via INTRAVENOUS
  Filled 2012-12-03: qty 100

## 2012-12-03 MED ORDER — METOPROLOL TARTRATE 12.5 MG HALF TABLET
12.5000 mg | ORAL_TABLET | Freq: Two times a day (BID) | ORAL | Status: DC
  Administered 2012-12-04 (×2): 12.5 mg via ORAL
  Filled 2012-12-03 (×5): qty 1

## 2012-12-03 MED ORDER — SODIUM CHLORIDE 0.9 % IJ SOLN
OROMUCOSAL | Status: DC | PRN
  Administered 2012-12-03: 10:00:00 via TOPICAL

## 2012-12-03 MED ORDER — LACTATED RINGERS IV SOLN
INTRAVENOUS | Status: DC | PRN
  Administered 2012-12-03 (×5): via INTRAVENOUS

## 2012-12-03 MED ORDER — METOCLOPRAMIDE HCL 5 MG/ML IJ SOLN
10.0000 mg | Freq: Four times a day (QID) | INTRAMUSCULAR | Status: AC
  Administered 2012-12-03 – 2012-12-04 (×4): 10 mg via INTRAVENOUS
  Filled 2012-12-03 (×4): qty 2

## 2012-12-03 MED ORDER — MICROFIBRILLAR COLL HEMOSTAT EX PADS
MEDICATED_PAD | CUTANEOUS | Status: DC | PRN
  Administered 2012-12-03: 1 via TOPICAL

## 2012-12-03 MED ORDER — MORPHINE SULFATE 2 MG/ML IJ SOLN
2.0000 mg | INTRAMUSCULAR | Status: DC | PRN
  Administered 2012-12-03 (×2): 2 mg via INTRAVENOUS
  Administered 2012-12-04: 4 mg via INTRAVENOUS
  Administered 2012-12-04 (×2): 2 mg via INTRAVENOUS
  Administered 2012-12-04 – 2012-12-05 (×6): 4 mg via INTRAVENOUS
  Administered 2012-12-05: 2 mg via INTRAVENOUS
  Filled 2012-12-03: qty 1
  Filled 2012-12-03 (×4): qty 2
  Filled 2012-12-03: qty 1
  Filled 2012-12-03 (×2): qty 2
  Filled 2012-12-03: qty 1
  Filled 2012-12-03: qty 2
  Filled 2012-12-03: qty 1

## 2012-12-03 MED ORDER — DEXTROSE 5 % IV SOLN
1.5000 g | Freq: Two times a day (BID) | INTRAVENOUS | Status: AC
  Administered 2012-12-03 – 2012-12-05 (×4): 1.5 g via INTRAVENOUS
  Filled 2012-12-03 (×4): qty 1.5

## 2012-12-03 MED ORDER — BISACODYL 10 MG RE SUPP: 10.0000 mg | Freq: Every day | RECTAL | Status: DC

## 2012-12-03 MED ORDER — ARTIFICIAL TEARS OP OINT
TOPICAL_OINTMENT | OPHTHALMIC | Status: DC | PRN
  Administered 2012-12-03: 1 via OPHTHALMIC

## 2012-12-03 MED ORDER — ONDANSETRON HCL 4 MG/2ML IJ SOLN
4.0000 mg | Freq: Four times a day (QID) | INTRAMUSCULAR | Status: DC | PRN
  Administered 2012-12-05: 4 mg via INTRAVENOUS
  Filled 2012-12-03: qty 2

## 2012-12-03 MED ORDER — FENTANYL CITRATE 0.05 MG/ML IJ SOLN
INTRAMUSCULAR | Status: DC | PRN
  Administered 2012-12-03 (×2): 250 ug via INTRAVENOUS
  Administered 2012-12-03: 100 ug via INTRAVENOUS
  Administered 2012-12-03 (×4): 250 ug via INTRAVENOUS
  Administered 2012-12-03: 500 ug via INTRAVENOUS

## 2012-12-03 MED ORDER — ROCURONIUM BROMIDE 100 MG/10ML IV SOLN
INTRAVENOUS | Status: DC | PRN
  Administered 2012-12-03 (×4): 50 mg via INTRAVENOUS

## 2012-12-03 MED ORDER — DOCUSATE SODIUM 100 MG PO CAPS
200.0000 mg | ORAL_CAPSULE | Freq: Every day | ORAL | Status: DC
Start: 2012-12-04 — End: 2012-12-07
  Administered 2012-12-04 – 2012-12-07 (×4): 200 mg via ORAL
  Filled 2012-12-03 (×4): qty 2

## 2012-12-03 MED ORDER — MIDAZOLAM HCL 5 MG/5ML IJ SOLN
INTRAMUSCULAR | Status: DC | PRN
  Administered 2012-12-03: 3 mg via INTRAVENOUS
  Administered 2012-12-03: 2 mg via INTRAVENOUS
  Administered 2012-12-03: 7 mg via INTRAVENOUS

## 2012-12-03 MED ORDER — DEXMEDETOMIDINE HCL IN NACL 200 MCG/50ML IV SOLN
0.1000 ug/kg/h | INTRAVENOUS | Status: DC
  Administered 2012-12-03: 0.6 ug/kg/h via INTRAVENOUS
  Filled 2012-12-03: qty 50

## 2012-12-03 MED ORDER — AMINOCAPROIC ACID 250 MG/ML IV SOLN
INTRAVENOUS | Status: DC | PRN
  Administered 2012-12-03: 5 g via INTRAVENOUS

## 2012-12-03 MED ORDER — SODIUM CHLORIDE 0.45 % IV SOLN
INTRAVENOUS | Status: DC
  Administered 2012-12-03: 14:00:00 via INTRAVENOUS
  Administered 2012-12-05: 20 mL/h via INTRAVENOUS

## 2012-12-03 MED ORDER — SERTRALINE HCL 50 MG PO TABS
50.0000 mg | ORAL_TABLET | Freq: Every day | ORAL | Status: DC
  Administered 2012-12-04 – 2012-12-07 (×4): 50 mg via ORAL
  Filled 2012-12-03 (×5): qty 1

## 2012-12-03 MED ORDER — VANCOMYCIN HCL IN DEXTROSE 1-5 GM/200ML-% IV SOLN
1000.0000 mg | Freq: Once | INTRAVENOUS | Status: DC
  Filled 2012-12-03: qty 200

## 2012-12-03 MED ORDER — FAMOTIDINE IN NACL 20-0.9 MG/50ML-% IV SOLN
20.0000 mg | Freq: Two times a day (BID) | INTRAVENOUS | Status: AC
  Administered 2012-12-03: 20 mg via INTRAVENOUS

## 2012-12-03 MED ORDER — POTASSIUM CHLORIDE 10 MEQ/50ML IV SOLN
10.0000 meq | INTRAVENOUS | Status: AC
  Administered 2012-12-03 (×3): 10 meq via INTRAVENOUS

## 2012-12-03 MED ORDER — LIDOCAINE HCL (CARDIAC) 20 MG/ML IV SOLN
INTRAVENOUS | Status: DC | PRN
  Administered 2012-12-03: 80 mg via INTRAVENOUS

## 2012-12-03 MED ORDER — MIDAZOLAM HCL 2 MG/2ML IJ SOLN: 2.0000 mg | INTRAMUSCULAR | Status: DC | PRN

## 2012-12-03 MED ORDER — DOPAMINE-DEXTROSE 3.2-5 MG/ML-% IV SOLN: 0.0000 ug/kg/min | INTRAVENOUS | Status: DC

## 2012-12-03 MED ORDER — PROPOFOL 10 MG/ML IV BOLUS
INTRAVENOUS | Status: DC | PRN
  Administered 2012-12-03: 40 mg via INTRAVENOUS

## 2012-12-03 MED ORDER — VANCOMYCIN HCL IN DEXTROSE 1-5 GM/200ML-% IV SOLN
1000.0000 mg | Freq: Two times a day (BID) | INTRAVENOUS | Status: AC
  Administered 2012-12-03 – 2012-12-04 (×3): 1000 mg via INTRAVENOUS
  Filled 2012-12-03 (×3): qty 200

## 2012-12-03 MED ORDER — MIDAZOLAM HCL 2 MG/2ML IJ SOLN
INTRAMUSCULAR | Status: AC
  Filled 2012-12-03: qty 2

## 2012-12-03 MED ORDER — ALBUMIN HUMAN 5 % IV SOLN
INTRAVENOUS | Status: DC | PRN
  Administered 2012-12-03: 12:00:00 via INTRAVENOUS

## 2012-12-03 MED ORDER — SODIUM CHLORIDE 0.9 % IJ SOLN: 3.0000 mL | INTRAMUSCULAR | Status: DC | PRN

## 2012-12-03 MED ORDER — FENTANYL CITRATE 0.05 MG/ML IJ SOLN
INTRAMUSCULAR | Status: AC
  Filled 2012-12-03: qty 2

## 2012-12-03 MED ORDER — METOPROLOL TARTRATE 1 MG/ML IV SOLN: 2.5000 mg | INTRAVENOUS | Status: DC | PRN

## 2012-12-03 MED ORDER — BISACODYL 5 MG PO TBEC
10.0000 mg | DELAYED_RELEASE_TABLET | Freq: Every day | ORAL | Status: DC
  Administered 2012-12-04 – 2012-12-07 (×3): 10 mg via ORAL
  Filled 2012-12-03 (×3): qty 2

## 2012-12-03 MED ORDER — LACTATED RINGERS IV SOLN: 500.0000 mL | Freq: Once | INTRAVENOUS | Status: DC | PRN

## 2012-12-03 MED ORDER — PROTAMINE SULFATE 10 MG/ML IV SOLN
INTRAVENOUS | Status: DC | PRN
  Administered 2012-12-03: 30 mg via INTRAVENOUS
  Administered 2012-12-03: 270 mg via INTRAVENOUS

## 2012-12-03 MED ORDER — SODIUM CHLORIDE 0.9 % IJ SOLN
3.0000 mL | Freq: Two times a day (BID) | INTRAMUSCULAR | Status: DC
  Administered 2012-12-04 (×2): 3 mL via INTRAVENOUS

## 2012-12-03 MED ORDER — ALBUMIN HUMAN 5 % IV SOLN
250.0000 mL | INTRAVENOUS | Status: AC | PRN
  Administered 2012-12-03 – 2012-12-04 (×2): 250 mL via INTRAVENOUS

## 2012-12-03 MED FILL — Potassium Chloride Inj 2 mEq/ML: INTRAVENOUS | Qty: 40 | Status: AC

## 2012-12-03 MED FILL — Magnesium Sulfate Inj 50%: INTRAMUSCULAR | Qty: 10 | Status: AC

## 2012-12-03 SURGICAL SUPPLY — 109 items
ADAPTER CARDIO PERF ANTE/RETRO (ADAPTER) ×4 IMPLANT
ADH SKN CLS APL DERMABOND .7 (GAUZE/BANDAGES/DRESSINGS) ×2
ADPR PRFSN 84XANTGRD RTRGD (ADAPTER) ×2
ATTRACTOMAT 16X20 MAGNETIC DRP (DRAPES) ×4 IMPLANT
BAG DECANTER FOR FLEXI CONT (MISCELLANEOUS) ×4 IMPLANT
BANDAGE ELASTIC 4 VELCRO ST LF (GAUZE/BANDAGES/DRESSINGS) ×4 IMPLANT
BANDAGE ELASTIC 6 VELCRO ST LF (GAUZE/BANDAGES/DRESSINGS) ×4 IMPLANT
BANDAGE GAUZE ELAST BULKY 4 IN (GAUZE/BANDAGES/DRESSINGS) ×4 IMPLANT
BASKET HEART  (ORDER IN 25'S) (MISCELLANEOUS) ×1
BASKET HEART (ORDER IN 25'S) (MISCELLANEOUS) ×1
BASKET HEART (ORDER IN 25S) (MISCELLANEOUS) ×2 IMPLANT
BLADE STERNUM SYSTEM 6 (BLADE) ×4 IMPLANT
BLADE SURG 12 STRL SS (BLADE) ×4 IMPLANT
BLADE SURG ROTATE 9660 (MISCELLANEOUS) IMPLANT
CANISTER SUCTION 2500CC (MISCELLANEOUS) ×4 IMPLANT
CANNULA AORTIC HI-FLOW 6.5M20F (CANNULA) ×4 IMPLANT
CANNULA GUNDRY RCSP 15FR (MISCELLANEOUS) ×4 IMPLANT
CANNULA VENOUS MAL SGL STG 40 (MISCELLANEOUS) IMPLANT
CANNULAE VENOUS MAL SGL STG 40 (MISCELLANEOUS)
CATH CPB KIT VANTRIGT (MISCELLANEOUS) ×4 IMPLANT
CATH ROBINSON RED A/P 18FR (CATHETERS) ×12 IMPLANT
CATH THORACIC 28FR (CATHETERS) IMPLANT
CATH THORACIC 28FR RT ANG (CATHETERS) IMPLANT
CATH THORACIC 36FR (CATHETERS) IMPLANT
CATH THORACIC 36FR RT ANG (CATHETERS) ×8 IMPLANT
CLIP TI WIDE RED SMALL 24 (CLIP) ×2 IMPLANT
CLOTH BEACON ORANGE TIMEOUT ST (SAFETY) ×4 IMPLANT
COVER SURGICAL LIGHT HANDLE (MISCELLANEOUS) ×4 IMPLANT
CRADLE DONUT ADULT HEAD (MISCELLANEOUS) ×4 IMPLANT
DERMABOND ADVANCED (GAUZE/BANDAGES/DRESSINGS) ×2
DERMABOND ADVANCED .7 DNX12 (GAUZE/BANDAGES/DRESSINGS) IMPLANT
DRAIN CHANNEL 10F 3/8 F FF (DRAIN) ×2 IMPLANT
DRAIN CHANNEL 32F RND 10.7 FF (WOUND CARE) ×4 IMPLANT
DRAPE CARDIOVASCULAR INCISE (DRAPES) ×4
DRAPE SLUSH MACHINE 52X66 (DRAPES) IMPLANT
DRAPE SLUSH/WARMER DISC (DRAPES) ×2 IMPLANT
DRAPE SRG 135X102X78XABS (DRAPES) ×2 IMPLANT
DRSG COVADERM 4X14 (GAUZE/BANDAGES/DRESSINGS) ×4 IMPLANT
ELECT BLADE 4.0 EZ CLEAN MEGAD (MISCELLANEOUS) ×4
ELECT BLADE 6.5 EXT (BLADE) ×4 IMPLANT
ELECT CAUTERY BLADE 6.4 (BLADE) ×4 IMPLANT
ELECT REM PT RETURN 9FT ADLT (ELECTROSURGICAL) ×8
ELECTRODE BLDE 4.0 EZ CLN MEGD (MISCELLANEOUS) ×2 IMPLANT
ELECTRODE REM PT RTRN 9FT ADLT (ELECTROSURGICAL) ×4 IMPLANT
EVACUATOR SILICONE 100CC (DRAIN) ×2 IMPLANT
GLOVE BIO SURGEON STRL SZ7 (GLOVE) ×12 IMPLANT
GLOVE BIO SURGEON STRL SZ7.5 (GLOVE) ×12 IMPLANT
GLOVE EUDERMIC 7 POWDERFREE (GLOVE) ×2 IMPLANT
GLOVE SS BIOGEL STRL SZ 6 (GLOVE) IMPLANT
GLOVE SUPERSENSE BIOGEL SZ 6 (GLOVE) ×16
GOWN STRL NON-REIN LRG LVL3 (GOWN DISPOSABLE) ×28 IMPLANT
HEMOSTAT POWDER SURGIFOAM 1G (HEMOSTASIS) ×12 IMPLANT
HEMOSTAT SURGICEL 2X14 (HEMOSTASIS) ×4 IMPLANT
INSERT FOGARTY XLG (MISCELLANEOUS) IMPLANT
KIT BASIN OR (CUSTOM PROCEDURE TRAY) ×4 IMPLANT
KIT ROOM TURNOVER OR (KITS) ×4 IMPLANT
KIT SUCTION CATH 14FR (SUCTIONS) ×4 IMPLANT
KIT VASOVIEW W/TROCAR VH 2000 (KITS) ×4 IMPLANT
LEAD PACING MYOCARDI (MISCELLANEOUS) ×4 IMPLANT
MARKER GRAFT CORONARY BYPASS (MISCELLANEOUS) ×12 IMPLANT
NS IRRIG 1000ML POUR BTL (IV SOLUTION) ×20 IMPLANT
PACK OPEN HEART (CUSTOM PROCEDURE TRAY) ×4 IMPLANT
PAD ARMBOARD 7.5X6 YLW CONV (MISCELLANEOUS) ×8 IMPLANT
PENCIL BUTTON HOLSTER BLD 10FT (ELECTRODE) ×4 IMPLANT
PUNCH AORTIC ROT 4.0MM RCL 40 (MISCELLANEOUS) ×2 IMPLANT
PUNCH AORTIC ROTATE 4.0MM (MISCELLANEOUS) IMPLANT
PUNCH AORTIC ROTATE 4.5MM 8IN (MISCELLANEOUS) IMPLANT
PUNCH AORTIC ROTATE 5MM 8IN (MISCELLANEOUS) IMPLANT
SPONGE GAUZE 4X4 12PLY (GAUZE/BANDAGES/DRESSINGS) ×8 IMPLANT
SPONGE LAP 18X18 X RAY DECT (DISPOSABLE) ×4 IMPLANT
SPONGE LAP 4X18 X RAY DECT (DISPOSABLE) ×2 IMPLANT
SUT BONE WAX W31G (SUTURE) ×4 IMPLANT
SUT MNCRL AB 4-0 PS2 18 (SUTURE) ×4 IMPLANT
SUT PROLENE 3 0 SH DA (SUTURE) ×2 IMPLANT
SUT PROLENE 3 0 SH1 36 (SUTURE) IMPLANT
SUT PROLENE 4 0 RB 1 (SUTURE) ×8
SUT PROLENE 4 0 SH DA (SUTURE) ×4 IMPLANT
SUT PROLENE 4-0 RB1 .5 CRCL 36 (SUTURE) ×2 IMPLANT
SUT PROLENE 5 0 C 1 36 (SUTURE) IMPLANT
SUT PROLENE 6 0 C 1 30 (SUTURE) ×2 IMPLANT
SUT PROLENE 7 0 DA (SUTURE) IMPLANT
SUT PROLENE 7.0 RB 3 (SUTURE) ×12 IMPLANT
SUT PROLENE 8 0 BV175 6 (SUTURE) ×4 IMPLANT
SUT PROLENE BLUE 7 0 (SUTURE) ×4 IMPLANT
SUT SILK  1 MH (SUTURE)
SUT SILK 1 MH (SUTURE) IMPLANT
SUT SILK 2 0 SH CR/8 (SUTURE) IMPLANT
SUT SILK 3 0 SH CR/8 (SUTURE) ×2 IMPLANT
SUT STEEL 6MS V (SUTURE) ×8 IMPLANT
SUT STEEL STERNAL CCS#1 18IN (SUTURE) IMPLANT
SUT STEEL SZ 6 DBL 3X14 BALL (SUTURE) ×4 IMPLANT
SUT VIC AB 1 CTX 36 (SUTURE) ×8
SUT VIC AB 1 CTX36XBRD ANBCTR (SUTURE) ×4 IMPLANT
SUT VIC AB 2-0 CT1 27 (SUTURE) ×12
SUT VIC AB 2-0 CT1 TAPERPNT 27 (SUTURE) IMPLANT
SUT VIC AB 2-0 CTX 27 (SUTURE) IMPLANT
SUT VIC AB 3-0 SH 27 (SUTURE)
SUT VIC AB 3-0 SH 27X BRD (SUTURE) IMPLANT
SUT VIC AB 3-0 X1 27 (SUTURE) IMPLANT
SUT VICRYL 4-0 PS2 18IN ABS (SUTURE) IMPLANT
SUTURE E-PAK OPEN HEART (SUTURE) ×4 IMPLANT
SYSTEM SAHARA CHEST DRAIN ATS (WOUND CARE) ×4 IMPLANT
TAPE CLOTH SURG 4X10 WHT LF (GAUZE/BANDAGES/DRESSINGS) ×2 IMPLANT
TOWEL OR 17X24 6PK STRL BLUE (TOWEL DISPOSABLE) ×8 IMPLANT
TOWEL OR 17X26 10 PK STRL BLUE (TOWEL DISPOSABLE) ×8 IMPLANT
TRAY FOLEY IC TEMP SENS 14FR (CATHETERS) ×4 IMPLANT
TUBING INSUFFLATION 10FT LAP (TUBING) ×4 IMPLANT
UNDERPAD 30X30 INCONTINENT (UNDERPADS AND DIAPERS) ×4 IMPLANT
WATER STERILE IRR 1000ML POUR (IV SOLUTION) ×8 IMPLANT

## 2012-12-03 NOTE — Anesthesia Preprocedure Evaluation (Addendum)
Anesthesia Evaluation  Patient identified by MRN, date of birth, ID band Patient awake    Reviewed: Allergy & Precautions, H&P , NPO status , Patient's Chart, lab work & pertinent test results  Airway Mallampati: II      Dental  (+) Edentulous Lower and Edentulous Upper   Pulmonary  breath sounds clear to auscultation        Cardiovascular Rhythm:Regular Rate:Normal     Neuro/Psych    GI/Hepatic   Endo/Other    Renal/GU      Musculoskeletal   Abdominal   Peds  Hematology   Anesthesia Other Findings   Reproductive/Obstetrics                           Anesthesia Physical Anesthesia Plan  ASA: III  Anesthesia Plan: General   Post-op Pain Management:    Induction: Intravenous  Airway Management Planned: Oral ETT  Additional Equipment: Arterial line, PA Cath, CVP, TEE and Ultrasound Guidance Line Placement  Intra-op Plan:   Post-operative Plan: Post-operative intubation/ventilation  Informed Consent: I have reviewed the patients History and Physical, chart, labs and discussed the procedure including the risks, benefits and alternatives for the proposed anesthesia with the patient or authorized representative who has indicated his/her understanding and acceptance.     Plan Discussed with: CRNA and Surgeon  Anesthesia Plan Comments: (Unstable angina with severe 2 V CAD with previous stents x2, preserved LV function Type 2 DM glucose 151 Htn Smoker  Plan GA with TEE  Kipp Brood)       Anesthesia Quick Evaluation

## 2012-12-03 NOTE — Progress Notes (Signed)
The Pt. Who is going for OHS in AM  is on Glucose stabilizer. The CBG result for last 4 consicutive hours was 156, 157, 145 and 121. Basing on the last CBG he is getting 2.4 ml/hr insulin drip. The on call PA [facility] for Dr. Bryan Lemma was notified,  and received an order to turn off the insulin drip with out transition order for glycemic control insulin coverage. Insulin drip turned off at 03:01 AM. Will continue monitoring.

## 2012-12-03 NOTE — Progress Notes (Signed)
  Echocardiogram Echocardiogram Transesophageal has been performed.  [name] 12/03/2012, 9:10 AM

## 2012-12-03 NOTE — Brief Op Note (Signed)
11/28/2012 - 12/03/2012  11:18 AM  PATIENT:  Dylan Church  62 y.o. male  PRE-OPERATIVE DIAGNOSIS:  CAD  POST-OPERATIVE DIAGNOSIS:  CABG x   PROCEDURE:  Procedure(s):  CORONARY ARTERY BYPASS GRAFTING x 3 -LIMA to LAD -SVG to OM1 -SVG to PLVB  ENDOSCOPIC SAPHENOUS VEIN HARVEST LEFT LEG -partial open harvest also had to be utilized due to bleeding from a vessel  INTRAOPERATIVE TRANSESOPHAGEAL ECHOCARDIOGRAM (N/A)  SURGEON:  Surgeon(s) and Role:    * Kerin Perna, MD - Primary  PHYSICIAN ASSISTANT: Lowella Dandy PA-C  ANESTHESIA:   general  EBL:  Total I/O In: 1900 [I.V.:1900] Out: 900 [Urine:900]  BLOOD ADMINISTERED: CELLSAVER  DRAINS: JP Drain Left Leg, Left pleural and mediastinal chest tubes   LOCAL MEDICATIONS USED:  NONE  SPECIMEN:  No Specimen  DISPOSITION OF SPECIMEN:  N/A  COUNTS:  YES  TOURNIQUET:  * No tourniquets in log *  DICTATION: .Dragon Dictation  PLAN OF CARE: Admit to inpatient   PATIENT DISPOSITION:  ICU - intubated and hemodynamically stable.   Delay start of Pharmacological VTE agent (>24hrs) due to surgical blood loss or risk of bleeding: yes

## 2012-12-03 NOTE — Procedures (Signed)
Extubation Procedure Note  Patient Details:   Name: Dylan Church DOB: 1951-03-03 MRN: [id number]   Airway Documentation:     Evaluation  O2 sats: stable throughout Complications: No apparent complications Patient did tolerate procedure well. Bilateral Breath Sounds: Clear   Yes  Pt tolerated rapid wean well, achieved VC, -20 NIF and positive for cuff leak. Pt extubated to 4lpm [place]. No dypsnea or stridor noted after extubation. Pt resting comfortably. RT will continue to monitor.   Beatris Si 12/03/2012, 6:37 PM

## 2012-12-03 NOTE — CV Procedure (Signed)
Intraoperative transesophageal echocardiography report:  Dylan Church is a 62 year old male with a known coronary artery disease who had previously undergone insertion of drug-eluting stents in the circumflex and right coronary artery. He presented with unstable angina and was found to have severe recurrent two-vessel coronary disease with in-stent stenosis in the circumflex vessel. He is now scheduled to undergo coronary artery bypass grafting by Dr. Donata Clay. Intraoperative transesophageal echocardiography was requested to evaluate the right and left ventricular function, to determine if any valvular pathology was present, and to assist with de-airing of the heart.  The patient was brought to the operating room at [facility] and general anesthesia was induced without difficulty. Following endotracheal intubation and orogastric suctioning, the transesophageal echocardiography probe was inserted into the esophagus without difficulty.  Impression: Pre-bypass findings:  1. Aortic valve: The aortic leaflets were mildly thickened but appeared to open normally. There was trace aortic insufficiency. 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  2. Mitral valve: The mitral leaflets were mildly thickened but coapted normally without prolapse or fluttering. There were no flail segments noted. There was trace to 1+ mitral insufficiency noted.  3. Left ventricle: The left ventricle  was of normal size. And measured 4.0 cm at end diastole at the mid-papillary level in the transgastric short axis view. There was  moderate left ventricular hypertrophy which was concentric. Left ventricular posterior wall measured 1.11 cm and the anterior wall measured 1.15 cm at end diastole at the mid-papillary level. There was mild hypokinesis of the inferior wall but the anterior wall, lateral wall, septum, and and apical region appeared to contract  well. Left ventricular ejection fraction was estimated at 55% there was no thrombus noted in the left ventricular apex.  4. Right ventricle: The right ventricular cavity appeared to be within normal limits of size. There was good contractility the right ventricular free wall.  5. Tricuspid valve: The tricuspid valve appeared structurally normal with trace tricuspid insufficiency  6. Interatrial septum: The interatrial septum was intact without evidence of patent foramen ovale or atrial septal defect by color Doppler or bubble study.  7. Left atrium: There was no thrombus noted in the left atrium or left atrial appendage.  8. Ascending aorta: There was mild thickening of the wall of the ascending aorta but no discrete atheromatous plaques noted. There was a well-defined aortic root and sinotubular ridge without effacement.  9. Descending aorta the descending aorta showed mild atheromatous disease and measured 2.42 cm in diameter.  Post-bypass findings:  1. Aortic valve: The aortic valve appeared unchanged in the pre-bypass study was trace aortic insufficiency and the valve opened normally. There was again filamentous attachments to the aortic leaflets which appeared unchanged from the pre-bypass study  2. Mitral valve: The mitral valve appeared unchanged from the pre-bypass study the leaflets coapted well there was trace mitral insufficiency.  3. Left ventricle: There was vigorous contractility of the anterior wall, lateral wall, and anterior septum. There was mild hypokinesis of the inferior wall. The ejection fraction was estimated at 55-60%.  4. Right ventricle: The right ventricular function appeared unchanged from the pre-bypass study. There was normal contractility the right ventricular free wall and normal right ventricular size.  Dylan Church, M.D.

## 2012-12-03 NOTE — Transfer of Care (Signed)
Immediate Anesthesia Transfer of Care Note  Patient: Dylan Church  Procedure(s) Performed: Procedure(s): CORONARY ARTERY BYPASS GRAFTING (CABG) (N/A) INTRAOPERATIVE TRANSESOPHAGEAL ECHOCARDIOGRAM (N/A)  Patient Location: SICU  Anesthesia Type:General  Level of Consciousness: sedated and unresponsive  Airway & Oxygen Therapy: Patient remains intubated per anesthesia plan and Patient placed on Ventilator (see vital sign flow sheet for setting)  Post-op Assessment: Post -op Vital signs reviewed and stable  Post vital signs: Reviewed and stable  Complications: No apparent anesthesia complications

## 2012-12-03 NOTE — Progress Notes (Addendum)
T[facility] BRIEF SICU PROGRESS NOTE  Day of Surgery  S/P Procedure(s) (LRB): CORONARY ARTERY BYPASS GRAFTING (CABG) (N/A) INTRAOPERATIVE TRANSESOPHAGEAL ECHOCARDIOGRAM (N/A)   Extubated AAI paced w/ stable hemodynamics Chest tube output low Excellent UOP  Plan: Continue routine postop  OWEN,CLARENCE H 12/03/2012 11:54 PM

## 2012-12-03 NOTE — Progress Notes (Signed)
The patient was examined and preop studies reviewed. There has been no change from the prior exam and the pati   Plan CABG tis am on T Tse ent is ready for surgery.

## 2012-12-03 NOTE — Anesthesia Postprocedure Evaluation (Signed)
  Anesthesia Post-op Note  Patient: Dylan Church  Procedure(s) Performed: Procedure(s): CORONARY ARTERY BYPASS GRAFTING (CABG) (N/A) INTRAOPERATIVE TRANSESOPHAGEAL ECHOCARDIOGRAM (N/A)  Patient Location: SICU  Anesthesia Type:General  Level of Consciousness: sedated and Patient remains intubated per anesthesia plan  Airway and Oxygen Therapy: Patient remains intubated per anesthesia plan and Patient placed on Ventilator (see vital sign flow sheet for setting)  Post-op Pain: none  Post-op Assessment: Post-op Vital signs reviewed and Patient's Cardiovascular Status Stable  Post-op Vital Signs: stable  Complications: No apparent anesthesia complications

## 2012-12-03 NOTE — Preoperative (Signed)
Beta Blockers   Reason not to administer Beta Blockers:beta blocker not given by floor RN.  plan is to give iv dose on induction.

## 2012-12-04 ENCOUNTER — Inpatient Hospital Stay (HOSPITAL_COMMUNITY): Payer: Non-veteran care

## 2012-12-04 DIAGNOSIS — I5189 Other ill-defined heart diseases: Secondary | ICD-10-CM | POA: Diagnosis present

## 2012-12-04 LAB — GLUCOSE, CAPILLARY
Glucose-Capillary: 102 mg/dL — ABNORMAL HIGH (ref 70–99)
Glucose-Capillary: 105 mg/dL — ABNORMAL HIGH (ref 70–99)
Glucose-Capillary: 106 mg/dL — ABNORMAL HIGH (ref 70–99)
Glucose-Capillary: 109 mg/dL — ABNORMAL HIGH (ref 70–99)
Glucose-Capillary: 111 mg/dL — ABNORMAL HIGH (ref 70–99)
Glucose-Capillary: 215 mg/dL — ABNORMAL HIGH (ref 70–99)
Glucose-Capillary: 82 mg/dL (ref 70–99)
Glucose-Capillary: 89 mg/dL (ref 70–99)
Glucose-Capillary: 89 mg/dL (ref 70–99)
Glucose-Capillary: 94 mg/dL (ref 70–99)

## 2012-12-04 LAB — CBC
HCT: 31.7 % — ABNORMAL LOW (ref 39.0–52.0)
HCT: 33.4 % — ABNORMAL LOW (ref 39.0–52.0)
Hemoglobin: 11.6 g/dL — ABNORMAL LOW (ref 13.0–17.0)
MCV: 94.3 fL (ref 78.0–100.0)
Platelets: 117 10*3/uL — ABNORMAL LOW (ref 150–400)
RBC: 3.36 MIL/uL — ABNORMAL LOW (ref 4.22–5.81)
RBC: 3.53 MIL/uL — ABNORMAL LOW (ref 4.22–5.81)
WBC: 12.5 10*3/uL — ABNORMAL HIGH (ref 4.0–10.5)

## 2012-12-04 LAB — CREATININE, SERUM
Creatinine, Ser: 0.61 mg/dL (ref 0.50–1.35)
GFR calc Af Amer: >90 mL/min (ref >90)
GFR calc non Af Amer: >90 mL/min (ref >90)

## 2012-12-04 LAB — POCT I-STAT 3, ART BLOOD GAS (G3+)
Acid-Base Excess: 5 mmol/L — ABNORMAL HIGH (ref 0.0–2.0)
Patient temperature: 38
pH, Arterial: 7.425 (ref 7.350–7.450)

## 2012-12-04 LAB — POCT I-STAT, CHEM 8
BUN: 6 mg/dL (ref 6–23)
Calcium, Ion: 1.25 mmol/L (ref 1.13–1.30)
Creatinine, Ser: 0.7 mg/dL (ref 0.50–1.35)
Hemoglobin: 11.6 g/dL — ABNORMAL LOW (ref 13.0–17.0)
Sodium: 138 mEq/L (ref 135–145)
TCO2: 30 mmol/L (ref 0–100)

## 2012-12-04 LAB — BASIC METABOLIC PANEL
CO2: 26 mEq/L (ref 19–32)
Chloride: 102 mEq/L (ref 96–112)
Creatinine, Ser: 0.57 mg/dL (ref 0.50–1.35)
Potassium: 4.5 mEq/L (ref 3.5–5.1)

## 2012-12-04 LAB — PREPARE PLATELET PHERESIS: Unit division: 0

## 2012-12-04 LAB — MAGNESIUM
Magnesium: 2.2 mg/dL (ref 1.5–2.5)
Magnesium: 2.1 mg/dL (ref 1.5–2.5)

## 2012-12-04 MED ORDER — SODIUM CHLORIDE 0.9 % IV SOLN
INTRAVENOUS | Status: DC | PRN
  Filled 2012-12-04 (×2): qty 1

## 2012-12-04 MED ORDER — NICOTINE 14 MG/24HR TD PT24
14.0000 mg | MEDICATED_PATCH | Freq: Every day | TRANSDERMAL | Status: DC
  Administered 2012-12-04 – 2012-12-07 (×4): 14 mg via TRANSDERMAL
  Filled 2012-12-04 (×4): qty 1

## 2012-12-04 MED ORDER — FUROSEMIDE 10 MG/ML IJ SOLN
20.0000 mg | Freq: Two times a day (BID) | INTRAMUSCULAR | Status: DC
  Administered 2012-12-04 (×2): 20 mg via INTRAVENOUS
  Filled 2012-12-04 (×4): qty 2

## 2012-12-04 MED ORDER — INSULIN REGULAR BOLUS VIA INFUSION
0.0000 [IU] | Freq: Three times a day (TID) | INTRAVENOUS | Status: AC | PRN
  Filled 2012-12-04: qty 10

## 2012-12-04 MED ORDER — INSULIN ASPART 100 UNIT/ML ~~LOC~~ SOLN: 0.0000 [IU] | SUBCUTANEOUS | Status: DC

## 2012-12-04 NOTE — Progress Notes (Signed)
Patient ID: Dylan Church, male   DOB: [birth year], 62 y.o.   MRN: [id number]  Hemodynamically stable Diuresing Remains on insulin drip. Up in chair with no complaints CBC    Component Value Date/Time   WBC 16.6* 12/04/2012 1700   RBC 3.53* 12/04/2012 1700   HGB 11.6* 12/04/2012 1750   HCT 34.0* 12/04/2012 1750   PLT 117* 12/04/2012 1700   MCV 94.6 12/04/2012 1700   MCH 32.9 12/04/2012 1700   MCHC 34.7 12/04/2012 1700   RDW 13.1 12/04/2012 1700   LYMPHSABS 2.2 11/28/2012 2300   MONOABS 0.5 11/28/2012 2300   EOSABS 0.1 11/28/2012 2300   BASOSABS 0.0 11/28/2012 2300    BMET    Component Value Date/Time   NA 138 12/04/2012 1750   K 4.1 12/04/2012 1750   CL 99 12/04/2012 1750   CO2 26 12/04/2012 0449   GLUCOSE 123* 12/04/2012 1750   BUN 6 12/04/2012 1750   CREATININE 0.70 12/04/2012 1750   CALCIUM 8.8 12/04/2012 0449   GFRNONAA >90 12/04/2012 1700   GFRAA >90 12/04/2012 1700

## 2012-12-04 NOTE — Progress Notes (Signed)
Clinical Social Work Department BRIEF PSYCHOSOCIAL ASSESSMENT 12/04/2012  Patient:  Dylan Church, Dylan Church     Account Number:  [id number]     Admit date:  11/28/2012  Clinical Social Worker:  Madaline Guthrie  Date/Time:  12/04/2012 02:50 PM  Referred by:  Care Management  Date Referred:  12/04/2012 Referred for  SNF Placement   Other Referral:   Interview type:  Family Other interview type:    PSYCHOSOCIAL DATA Living Status:  WIFE Admitted from facility:   Level of care:   Primary support name:  Carollee Herter [phone] Primary support relationship to patient:  CHILD, ADULT Degree of support available:   Jaidon Ellery who is patients son lives at home with him. Pts wife is also at home but has Alzheimer's so pts son and his friend are caring for both parents.    CURRENT CONCERNS Current Concerns  None Noted   Other Concerns:    SOCIAL WORK ASSESSMENT / PLAN CSW met with patient and patient's son Aum Caggiano [phone]). Prior to hospitalization patient was living at home with his wife, son, and his friend. Pts wife who has Alzheimer was admitted to the hospital this morning after falling down at home. Pts son has been bouncing back and forth from pts room to pt's wifes room. Patient expressed his gratitude for his son taking on such a big responsibility caring for both of them. Patient's son stated that he took the rest of the month off so that pt could discharge home and he would care for him. Pt talked about how he will never smoke again because it caused him this hospitalization. CSW provided support and encouragement for patient!   Assessment/plan status:   Other assessment/ plan:   Information/referral to community resources:    PATIENTS/FAMILYS RESPONSE TO PLAN OF CARE: Patient and family are happy with discharge plan. Pt will have a large degree of support at home.

## 2012-12-04 NOTE — Progress Notes (Signed)
Subjective:  Awake and alert  Objective:  Vital Signs in the last 24 hours: Temp:  [96.1 F (35.6 C)-100.9 F (38.3 C)] 99.9 F (37.7 C) (04/08 0800) Pulse Rate:  [78-110] 96 (04/08 0800) Resp:  [12-26] 19 (04/08 0800) BP: (81-122)/(41-79) 92/51 mmHg (04/08 0800) SpO2:  [95 %-100 %] 97 % (04/08 0800) Arterial Line BP: (102-143)/(54-67) 130/63 mmHg (04/08 0800) FiO2 (%):  [40 %-50 %] 40 % (04/07 1800) Weight:  [99.2 kg (218 lb 11.1 oz)] 99.2 kg (218 lb 11.1 oz) (04/08 0500)  Intake/Output from previous day:  Intake/Output Summary (Last 24 hours) at 12/04/12 0907 Last data filed at 12/04/12 0813  Gross per 24 hour  Intake 7922.55 ml  Output   7975 ml  Net -52.45 ml    Physical Exam: General appearance: alert, cooperative and no distress Lungs: clear to auscultation bilaterally Heart: regular rate and rhythm   Rate: 80  Rhythm: normal sinus rhythm  Lab Results:  Recent Labs  12/03/12 1930 12/04/12 0449  WBC 12.0* 12.5*  HGB 11.9* 11.0*  PLT 113* 117*    Recent Labs  12/03/12 0547  12/03/12 1926 12/03/12 1930 12/04/12 0449  NA 139  < > 140  --  136  K 4.1  < > 4.4  --  4.5  CL 103  --  104  --  102  CO2 30  --   --   --  26  GLUCOSE 151*  < > 167*  --  94  BUN 7  --  4*  --  6  CREATININE 0.58  --  0.60 0.54 0.57  < > = values in this interval not displayed. No results found for this basename: TROPONINI, CK, MB,  in the last 72 hours Hepatic Function Panel No results found for this basename: PROT, ALBUMIN, AST, ALT, ALKPHOS, BILITOT, BILIDIR, IBILI,  in the last 72 hours No results found for this basename: CHOL,  in the last 72 hours  Recent Labs  12/03/12 1300  INR 1.24    Imaging: Imaging results have been reviewed  Cardiac Studies:  Assessment/Plan:   Principal Problem:   Unstable angina Active Problems:   CAD h/o PCI,  progression 11/29/12- CABG - X3 12/03/12   DIABETES MELLITUS   BACK PAIN, CHRONIC- followed at pain clinic, he has an  implantable MSO4 pump   HYPERLIPIDEMIA   HYPERTENSION   Tobacco abuse   Obesity, unspecified   Agent orange exposure- on disabilty from. [place] Vet '69-70   Diastolic dysfunction grade 1 with an EF 60-65% 2D 11/30/12  Plan- Same cardiac Rx    Corine Shelter PA-C 12/04/2012, 9:07 AM  Agree with note written by Corine Shelter PAC  POD # 1 CABG X 3. Looks good. VSS. Exam benign. PAD 16-20. Good EF pre op. Nl progression. Rx per [facility].   Runell Gess 12/04/2012 9:28 AM

## 2012-12-04 NOTE — Addendum Note (Signed)
Addendum created 12/04/12 0911 by Germaine Pomfret, MD   Modules edited: Anesthesia Blocks and Procedures

## 2012-12-04 NOTE — Progress Notes (Signed)
Anesthesiology Follow-up:  POD #1 S/P CABG X 3. Alert, neuro intact sitting in chair, hemodynamically stable  VS: T- 36.5 BP 125/70 HR 104 (SR)  RR 23 O2 Sat 98% on 2L  H/H: 11.6/33.4 Plts 117,000 Cr 0.61 K-4.1 glucose 123  CXR: bibasilar atelectasis low lung volumes  Extubated 5 hours post-op. Doing well overall  Kipp Brood, MD

## 2012-12-04 NOTE — Anesthesia Procedure Notes (Signed)
Procedures PA catheter:  Routine monitors. Timeout, sterile prep, drape, FBP R neck.  Trendelenburg position.  1% Lido local, finder and trocar RIJ 1st pass with US guidance.  Cordis placed over J wire. PA catheter in easily.  Sterile dressing applied.  Patient tolerated well, VSS.  Sandford Craze, MD  [phone]

## 2012-12-04 NOTE — Op Note (Signed)
NAMERAJIV, PARLATO NO.:  [id number]  MEDICAL RECORD NO.:  [id number]  LOCATION:  2310                         FACILITY:  [facility]  PHYSICIAN:  Kerin Perna, M.D.  DATE OF BIRTH:  [birth year]  DATE OF PROCEDURE:  12/03/2012 DATE OF DISCHARGE:                              OPERATIVE REPORT   OPERATION: 1. Coronary artery bypass grafting x3 (left internal mammary artery to     LAD, saphenous vein graft to OM1, saphenous vein graft to     circumflex posterolateral.) 2. Endoscopic harvest (left leg greater saphenous vein.)  SURGEON:  Kerin Perna, M.D.  ASSISTANT:  Pauline Good PA-C.  ANESTHESIA:  General by Guadalupe Maple, M.D.  PREOPERATIVE DIAGNOSIS:  Unstable angina, severe three-vessel coronary artery disease.  POSTOPERATIVE DIAGNOSIS:  Unstable angina, severe three-vessel coronary artery disease.  INDICATIONS:  The patient is a 62 year old Caucasian male, diabetic smoker who presents with unstable angina and was found to have severe multivessel coronary artery disease.  He had a left dominant coronary circulation with high-grade stenosis to the LAD, circumflex, and distal posterolateral.  He had good LV function and his 2D echo showed no significant valvular disease.  He was stabilized with nitroglycerin, heparin, and Integrilin, and surgical consultation was requested.  I discussed the results of his cardiac catheterization and the recommendations for surgical coronary revascularization-CABG-for his multivessel CAD.  I discussed the major aspects of the operation including the use of general anesthesia and cardiopulmonary bypass, the location of the surgical incisions, and the expected postoperative hospital recovery.  I discussed with him the major risks of surgery including the risks of stroke, MI, bleeding, infection, and death.  He understood these issues and agreed to proceed with surgery under what I felt was an informed  consent.  OPERATIVE FINDINGS: 1. Vein harvested from left leg, right leg vein appears to be     inadequate. 2. Adequate targets. 3. No blood transfusion required for surgery.  OPERATIVE PROCEDURE:  The patient was brought to the operating room and placed supine on the operating room table, where general anesthesia was induced under invasive hemodynamic monitoring.  The chest, abdomen, and legs were prepped with Betadine and draped as a sterile field.  A sternal incision was made as the saphenous vein was harvested endoscopically from the left leg.  The left internal mammary artery was harvested as a pedicle graft from its origin at the subclavian vessels. There was a 1.5-mm vessel with excellent flow.  The sternal retractor was placed and the pericardium was opened and suspended.  Pursestrings were placed in the ascending aorta and right atrium and after heparin was administered, the ACT was documented as being therapeutic, then the patient was placed on cardiopulmonary bypass.  The coronary arteries were identified for grafting and the mammary artery and vein grafts were prepared for the distal anastomoses.  Cardioplegic cannula was replaced for both antegrade and retrograde cold blood cardioplegia and the patient was cooled to 32 degrees.  The aortic crossclamp was applied and 800 mL of cold blood cardioplegia was delivered in split doses between the antegrade aortic and retrograde coronary sinus catheters.  There was a good  cardioplegic arrest and septal temperature dropped less than 15 degrees.  Cardioplegia was delivered every 20 minutes while the crossclamp was in place.  The distal coronary anastomoses were performed.  The first distal anastomosis was to the distal circumflex posterolateral.  This was a 1.5- mm vessel proximal to 90% stenosis.  A reverse saphenous vein was sewn end-to-side with running 7-0 Prolene with good flow through the graft. The second distal  anastomosis was the OM1 branch of the circumflex. This was a 1.5-mm vessel with a proximal 90% stenosis.  A reverse saphenous vein was sewn end-to-side with running 7-0 Prolene with good flow through the graft.  Cardioplegia was redosed.  The third distal anastomosis was to the distal third of the LAD.  It had diffuse diabetic- type pattern of disease.  It had a proximal 90% stenosis.  The left IMA pedicle was brought through an opening in the left lateral pericardium, was brought down onto the LAD and sewn end-to-side with running 8-0 Prolene.  There was good flow through the anastomosis after briefly releasing the pedicle bulldog on the mammary artery.  The bulldog was reapplied and the pedicle was secured with the epicardium.  Cardioplegia was redosed.  While the crossclamp was still in place, 2 proximal vein anastomoses were performed on the ascending aorta with a 4.0 mm punch from running 7- 0 Prolene.  Prior to tying down the final proximal anastomosis, air was vented from the coronaries with a dose of retrograde warm blood cardioplegia.  The crossclamp was removed.  The heart resumed a spontaneous rhythm. The bypass grafts were open and each had good flow.  Hemostasis was documented at the proximal and distal anastomoses.  Temporary pacing wires were applied.  The patient was rewarmed to 37 degrees.  The lungs re-expanded and the ventilator was resumed.  The patient was weaned from cardiopulmonary bypass without difficulty.  The transesophageal echo showed good global LV function.  Hemodynamics were stable.  Protamine was administered without adverse reaction.  The cannula was removed. The mediastinum was irrigated with warm saline.  The superior pericardial fat was closed over the aorta.  The anterior mediastinal and left pleural chest tube were placed and brought out through separate incisions.  The sternum was closed with wire.  The pectoralis fascia was closed in a running  #1 Vicryl.  The subcutaneous and skin layers were closed in running Vicryl and sterile dressings were applied.  Total cardiopulmonary bypass time 100 minutes.     Kerin Perna, M.D.     PV/MEDQ  D:  12/03/2012  T:  12/04/2012  Job:  [id number]

## 2012-12-04 NOTE — Progress Notes (Addendum)
                   [address]            [place] [address]          [phone]      1 Day Post-Op Procedure(s) (LRB): CORONARY ARTERY BYPASS GRAFTING (CABG) (N/A) INTRAOPERATIVE TRANSESOPHAGEAL ECHOCARDIOGRAM (N/A)  Subjective: Patient's only complaint is sternal incisional pain  Objective: Vital signs in last 24 hours: Temp:  [96.1 F (35.6 C)-100.9 F (38.3 C)] 100.2 F (37.9 C) (04/08 0700) Pulse Rate:  [78-110] 92 (04/08 0700) Cardiac Rhythm:  [-] Normal sinus rhythm (04/07 2000) Resp:  [12-26] 14 (04/08 0700) BP: (81-122)/(41-79) 113/70 mmHg (04/08 0700) SpO2:  [95 %-100 %] 97 % (04/08 0700) Arterial Line BP: (102-143)/(54-67) 125/63 mmHg (04/08 0700) FiO2 (%):  [40 %-50 %] 40 % (04/07 1800) Weight:  [99.2 kg (218 lb 11.1 oz)] 99.2 kg (218 lb 11.1 oz) (04/08 0500)  Pre op weight  96.2 kg Current Weight  12/04/12 99.2 kg (218 lb 11.1 oz)    Hemodynamic parameters for last 24 hours: PAP: (23-42)/(11-23) 41/21 mmHg CO:  [6.7 L/min-8.2 L/min] 8.1 L/min CI:  [3.1 L/min/m2-3.8 L/min/m2] 3.8 L/min/m2  Intake/Output from previous day: 04/07 0701 - 04/08 0700 In: 7552.9 [I.V.:4452.9; Blood:1360; NG/GT:40; IV Piggyback:1700] Out: 9147 [WGNFA:2130; Emesis/NG output:30; Drains:65; Blood:750; Chest Tube:565]   Physical Exam:  Cardiovascular: RRR, rubs with chest tubes in place Pulmonary: Diminished at bases; no rales, wheezes, or rhonchi. Abdomen: Soft, non tender, bowel sounds present. Extremities: Mild bilateral lower extremity edema. Wounds: Dressings are clean and dry.    Lab Results: CBC: Recent Labs  12/03/12 1930 12/04/12 0449  WBC 12.0* 12.5*  HGB 11.9* 11.0*  HCT 33.0* 31.7*  PLT 113* 117*   BMET:  Recent Labs  12/03/12 0547  12/03/12 1926 12/03/12 1930 12/04/12 0449  NA 139  < > 140  --  136  K 4.1  < > 4.4  --  4.5  CL 103  --  104  --  102  CO2 30  --   --   --  26  GLUCOSE 151*  < > 167*  --  94  BUN 7  --  4*  --   6  CREATININE 0.58  --  0.60 0.54 0.57  CALCIUM 9.4  --   --   --  8.8  < > = values in this interval not displayed.  PT/INR:  Lab Results  Component Value Date   INR 1.24 12/03/2012   INR 1.07 11/29/2012   ABG:  INR: Will add last result for INR, ABG once components are confirmed Will add last 4 CBG results once components are confirmed  Assessment/Plan:  1. CV - SR. On Lopressor 12.5 bid 2.  Pulmonary - CXR this am appears to show no pneumothorax, bibasilar atelectasis and small pleural effusions.Possibly remove chest tubes later today.Encourage incentive spirometer and flutter valve. 3. Volume Overload - Begin diuresis 4.  Acute blood loss anemia - H and H stable at 11 and 31.7. 5.Mild thrombocytopenia 6.-CBGs 109/106/111. Pre op HGA1C 11.6. On Insulin pre op. Will gradually wean off insulin drip 7.JP from left lower extremity has had 65 cc of serosanguinous output. Likely remove in am.  8.Nicotine patch 9.Please see progression orders  ZIMMERMAN,DONIELLE MPA-C 12/04/2012,7:34 AM  patient examined and medical record reviewed,agree with above note. VAN TRIGT III,Carmin Dibartolo 12/04/2012

## 2012-12-04 NOTE — Progress Notes (Signed)
Inpatient Diabetes Program Recommendations  AACE/ADA: New Consensus Statement on Inpatient Glycemic Control (2013)  Target Ranges:  Prepandial:   less than 140 mg/dL      Peak postprandial:   less than 180 mg/dL (1-2 hours)      Critically ill patients:  140 - 180 mg/dL   Reason for Visit: Patient is post-op CABG day 1.  A1C prior to procedure was 11.3%.  According to medication reconciliation, patient was taking Levemir 60 units in the morning and 45 units in the PM prior to admission.  Called and discussed transition off insulin drip with PA, Donnelle Zimmerman.  She states that patient will remain on insulin drip today due to high insulin needs and to keep CBG's within range.  Agree with plan.  Will discuss with RN.

## 2012-12-05 ENCOUNTER — Inpatient Hospital Stay (HOSPITAL_COMMUNITY): Payer: Non-veteran care

## 2012-12-05 ENCOUNTER — Encounter (HOSPITAL_COMMUNITY): Payer: Self-pay | Admitting: Cardiothoracic Surgery

## 2012-12-05 LAB — CBC
Hemoglobin: 10.5 g/dL — ABNORMAL LOW (ref 13.0–17.0)
MCH: 32.3 pg (ref 26.0–34.0)
MCHC: 34.3 g/dL (ref 30.0–36.0)
MCV: 94.2 fL (ref 78.0–100.0)
Platelets: 97 10*3/uL — ABNORMAL LOW (ref 150–400)
RBC: 3.25 MIL/uL — ABNORMAL LOW (ref 4.22–5.81)

## 2012-12-05 LAB — GLUCOSE, CAPILLARY
Glucose-Capillary: 109 mg/dL — ABNORMAL HIGH (ref 70–99)
Glucose-Capillary: 117 mg/dL — ABNORMAL HIGH (ref 70–99)
Glucose-Capillary: 125 mg/dL — ABNORMAL HIGH (ref 70–99)
Glucose-Capillary: 134 mg/dL — ABNORMAL HIGH (ref 70–99)
Glucose-Capillary: 138 mg/dL — ABNORMAL HIGH (ref 70–99)
Glucose-Capillary: 156 mg/dL — ABNORMAL HIGH (ref 70–99)
Glucose-Capillary: 162 mg/dL — ABNORMAL HIGH (ref 70–99)
Glucose-Capillary: 229 mg/dL — ABNORMAL HIGH (ref 70–99)
Glucose-Capillary: 98 mg/dL (ref 70–99)

## 2012-12-05 LAB — BASIC METABOLIC PANEL
BUN: 9 mg/dL (ref 6–23)
CO2: 29 mEq/L (ref 19–32)
Calcium: 8.7 mg/dL (ref 8.4–10.5)
GFR calc non Af Amer: >90 mL/min (ref >90)
Glucose, Bld: 115 mg/dL — ABNORMAL HIGH (ref 70–99)

## 2012-12-05 MED ORDER — METOPROLOL TARTRATE 25 MG/10 ML ORAL SUSPENSION
25.0000 mg | Freq: Two times a day (BID) | ORAL | Status: DC
  Filled 2012-12-05 (×2): qty 10

## 2012-12-05 MED ORDER — FUROSEMIDE 10 MG/ML IJ SOLN
40.0000 mg | Freq: Once | INTRAMUSCULAR | Status: AC
  Administered 2012-12-05: 40 mg via INTRAVENOUS
  Filled 2012-12-05: qty 4

## 2012-12-05 MED ORDER — DIGOXIN 0.25 MG/ML IJ SOLN
0.2500 mg | Freq: Every day | INTRAMUSCULAR | Status: AC
  Administered 2012-12-05: 0.25 mg via INTRAVENOUS
  Filled 2012-12-05: qty 1

## 2012-12-05 MED ORDER — INSULIN DETEMIR 100 UNIT/ML ~~LOC~~ SOLN
20.0000 [IU] | Freq: Two times a day (BID) | SUBCUTANEOUS | Status: DC
  Filled 2012-12-05 (×2): qty 0.2

## 2012-12-05 MED ORDER — INSULIN ASPART 100 UNIT/ML ~~LOC~~ SOLN
6.0000 [IU] | Freq: Three times a day (TID) | SUBCUTANEOUS | Status: DC
  Administered 2012-12-05 – 2012-12-07 (×5): 6 [IU] via SUBCUTANEOUS

## 2012-12-05 MED ORDER — METOPROLOL TARTRATE 25 MG PO TABS
25.0000 mg | ORAL_TABLET | Freq: Two times a day (BID) | ORAL | Status: DC
  Administered 2012-12-05 – 2012-12-06 (×3): 25 mg via ORAL
  Filled 2012-12-05 (×6): qty 1

## 2012-12-05 MED ORDER — DIGOXIN 250 MCG PO TABS
0.2500 mg | ORAL_TABLET | Freq: Every day | ORAL | Status: DC
  Administered 2012-12-06 – 2012-12-07 (×2): 0.25 mg via ORAL
  Filled 2012-12-05 (×2): qty 1

## 2012-12-05 MED ORDER — CYCLOBENZAPRINE HCL 10 MG PO TABS
10.0000 mg | ORAL_TABLET | Freq: Three times a day (TID) | ORAL | Status: DC | PRN
  Administered 2012-12-05 – 2012-12-07 (×5): 10 mg via ORAL
  Filled 2012-12-05 (×5): qty 1

## 2012-12-05 MED ORDER — INSULIN DETEMIR 100 UNIT/ML ~~LOC~~ SOLN
45.0000 [IU] | Freq: Two times a day (BID) | SUBCUTANEOUS | Status: DC
  Administered 2012-12-05 – 2012-12-07 (×5): 45 [IU] via SUBCUTANEOUS
  Filled 2012-12-05 (×7): qty 0.45

## 2012-12-05 MED ORDER — INSULIN ASPART 100 UNIT/ML ~~LOC~~ SOLN
0.0000 [IU] | SUBCUTANEOUS | Status: DC
  Administered 2012-12-05: 21:00:00 via SUBCUTANEOUS

## 2012-12-05 MED ORDER — INSULIN DETEMIR 100 UNIT/ML ~~LOC~~ SOLN
45.0000 [IU] | Freq: Two times a day (BID) | SUBCUTANEOUS | Status: DC
  Filled 2012-12-05 (×2): qty 0.45

## 2012-12-05 MED ORDER — POTASSIUM CHLORIDE CRYS ER 20 MEQ PO TBCR
20.0000 meq | EXTENDED_RELEASE_TABLET | Freq: Once | ORAL | Status: AC
  Administered 2012-12-05: 20 meq via ORAL
  Filled 2012-12-05: qty 1

## 2012-12-05 MED ORDER — INSULIN ASPART 100 UNIT/ML ~~LOC~~ SOLN: 0.0000 [IU] | Freq: Three times a day (TID) | SUBCUTANEOUS | Status: DC

## 2012-12-05 MED FILL — Electrolyte-R (PH 7.4) Solution: INTRAVENOUS | Qty: 3000 | Status: AC

## 2012-12-05 MED FILL — Heparin Sodium (Porcine) Inj 1000 Unit/ML: INTRAMUSCULAR | Qty: 30 | Status: AC

## 2012-12-05 MED FILL — Mannitol IV Soln 20%: INTRAVENOUS | Qty: 500 | Status: AC

## 2012-12-05 MED FILL — Sodium Bicarbonate IV Soln 8.4%: INTRAVENOUS | Qty: 50 | Status: AC

## 2012-12-05 MED FILL — Lidocaine HCl IV Inj 20 MG/ML: INTRAVENOUS | Qty: 5 | Status: AC

## 2012-12-05 MED FILL — Sodium Chloride Irrigation Soln 0.9%: Qty: 3000 | Status: AC

## 2012-12-05 MED FILL — Heparin Sodium (Porcine) Inj 1000 Unit/ML: INTRAMUSCULAR | Qty: 20 | Status: AC

## 2012-12-05 MED FILL — Sodium Chloride IV Soln 0.9%: INTRAVENOUS | Qty: 1000 | Status: AC

## 2012-12-05 NOTE — Progress Notes (Signed)
Pt. Seen and examined. Agree with the NP/PA-C note as written.  Coming along - some fatigue, no significant pain. Walked around the unit without significant shortness of breath. Still remains mildly tachycardic - there may be room to uptitrate his metoprolol.  Chrystie Nose, MD, [facility] [place] Attending Cardiologist [facility]

## 2012-12-05 NOTE — Progress Notes (Signed)
Subjective: Only complains of fatugue  Objective: Vital signs in last 24 hours: Temp:  [98.1 F (36.7 C)-99.4 F (37.4 C)] 98.6 F (37 C) (04/09 1158) Pulse Rate:  [92-115] 99 (04/09 1049) Resp:  [14-27] 26 (04/09 1049) BP: (99-147)/(58-79) 124/74 mmHg (04/09 0830) SpO2:  [88 %-99 %] 96 % (04/09 1049) Weight:  [95.4 kg (210 lb 5.1 oz)] 95.4 kg (210 lb 5.1 oz) (04/09 0400) Weight change: -3.8 kg (-8 lb 6 oz) Last BM Date: 12/01/12 Intake/Output from previous day: -1290   Wt 95.4 down from 99.2 yesterday. 04/08 0701 - 04/09 0700 In: 2329.5 [P.O.:1120; I.V.:709.5; IV Piggyback:500] Out: 3595 [Urine:3130; Drains:55; Chest Tube:410] Intake/Output this shift: Total I/O In: 30.5 [I.V.:30.5] Out: 1552 [Urine:1450; Drains:2; Chest Tube:100]  PE: General:alert and oriented, follows commands Neck:minimal JVD Heart:S1S2  RRR slightly tachycardic Lungs:clear but diminished ant. Abd:+ BS soft, non tender Ext:no edema EKG with nonspecific ST elevation lead II, change from 12/04/12  Hemodynamic parameters for last 24 hours:  PAP: (39)/(17-21) 39/17 mmHg  CO: [6.7 L/min] 6.7 L/min  CI: [3.1 L/min/m2] 3.1 L/min/m2      Lab Results:  Recent Labs  12/04/12 1700 12/04/12 1750 12/05/12 0415  WBC 16.6*  --  13.7*  HGB 11.6* 11.6* 10.5*  HCT 33.4* 34.0* 30.6*  PLT 117*  --  97*   BMET  Recent Labs  12/04/12 0449  12/04/12 1750 12/05/12 0415  NA 136  --  138 137  K 4.5  --  4.1 4.0  CL 102  --  99 100  CO2 26  --   --  29  GLUCOSE 94  --  123* 115*  BUN 6  --  6 9  CREATININE 0.57  < > 0.70 0.57  CALCIUM 8.8  --   --  8.7  < > = values in this interval not displayed. No results found for this basename: TROPONINI, CK, MB,  in the last 72 hours  Lab Results  Component Value Date   CHOL 109 11/29/2012   HDL 21* 11/29/2012   LDLCALC 48 11/29/2012   TRIG 200* 11/29/2012   CHOLHDL 5.2 11/29/2012   Lab Results  Component Value Date   HGBA1C 11.6* 11/29/2012     No results found  for this basename: TSH      Studies/Results: Dg Chest Port 1 View  12/05/2012  *RADIOLOGY REPORT*  Clinical Data: Postop CABG  PORTABLE CHEST - 1 VIEW  Comparison: 12/04/2012  Findings: Interval removal of right IJ Swan-Ganz venous catheter. Stable right IJ venous sheath.  Stable left chest tube and mediastinal drain.  No pneumothorax is seen.  Mild bibasilar atelectasis.  Mild cardiomegaly. Postsurgical changes related to prior CABG.  IMPRESSION: Stable left chest tube and mediastinal drain.  No pneumothorax is seen.   Original Report Authenticated By: Charline Bills, M.D.    Dg Chest Portable 1 View In Am  12/04/2012  *RADIOLOGY REPORT*  Clinical Data: Postop study.  PORTABLE CHEST - 1 VIEW  Comparison: Chest x-ray 12/03/2012.  Findings: Previously noted endotracheal and nasogastric tubes have been removed.  Right IJ approach Swan-Ganz catheter remains with tip in the descending right pulmonary artery branch.  The left- sided chest tube with tip in the mid left hemithorax.  Mediastinal drain remains in position.  Lung volumes are low.  There are some bibasilar opacities which presumably reflect some mild postoperative subsegmental atelectasis.  Probable trace bilateral pleural effusions.  No definite pneumothorax.  No evidence of pulmonary edema. Cardiopericardial silhouette  appears mildly enlarged, and there appears to be a small amount of gas within the pericardial space (unchanged).  Status post median sternotomy for CABG.  IMPRESSION: 1.  Postoperative changes and support apparatus, as above. 2.  Persistent low lung volumes with bibasilar subsegmental atelectasis and trace bilateral pleural effusions. 3.  Small amount of pneumopericardium is unchanged.   Original Report Authenticated By: Trudie Reed, M.D.    Dg Chest Portable 1 View  12/03/2012  *RADIOLOGY REPORT*  Clinical Data: Status post CABG.  PORTABLE CHEST - 1 VIEW  Comparison: Preoperative x-ray on 11/30/2012  Findings: Endotracheal tube  is present with the tip approximately 3.5 cm above the carina.  Left chest tube is in place with no pneumothorax identified.  Swan-Ganz catheter tip extends into the right pulmonary artery.  Lungs show no edema or significant consolidation.  Mild bilateral lower lobe atelectasis present.  The heart size and mediastinal contours are stable and normal.  IMPRESSION: No pneumothorax.  Mild bilateral lower lobe atelectasis present.   Original Report Authenticated By: Irish Lack, M.D.     Medications: I have reviewed the patient's current medications. Scheduled Meds: . acetaminophen  1,000 mg Oral Q6H   Or  . acetaminophen (TYLENOL) oral liquid 160 mg/5 mL  975 mg Per Tube Q6H  . aspirin EC  325 mg Oral Daily  . atorvastatin  20 mg Oral q1800  . bisacodyl  10 mg Oral Daily   Or  . bisacodyl  10 mg Rectal Daily  . budesonide-formoterol  2 puff Inhalation BID  . [START ON 12/06/2012] digoxin  0.25 mg Oral Daily  . docusate sodium  200 mg Oral Daily  . gabapentin  800 mg Oral TID  . insulin detemir  45 Units Subcutaneous BID  . insulin regular  0-10 Units Intravenous TID WC  . levalbuterol  2 puff Inhalation Q6H  . metoprolol tartrate  25 mg Oral BID  . nicotine  14 mg Transdermal Daily  . pantoprazole  40 mg Oral Daily  . sertraline  50 mg Oral Daily   Continuous Infusions: . sodium chloride 20 mL/hr at 12/05/12 1100  . sodium chloride    . insulin (NOVOLIN-R) infusion 14.6 Units/hr (12/04/12 2200)  . nitroGLYCERIN Stopped (12/04/12 0200)   PRN Meds:.ALPRAZolam, cyclobenzaprine, insulin regular, metoprolol, morphine injection, ondansetron (ZOFRAN) IV, oxyCODONE, sodium chloride  Assessment/Plan: Principal Problem:   Unstable angina Active Problems:   DIABETES MELLITUS   HYPERLIPIDEMIA   HYPERTENSION   BACK PAIN, CHRONIC- followed at pain clinic, he has an implantable MSO4 pump   CAD h/o PCI,  progression 11/29/12- CABG - X3 12/03/12   Tobacco abuse   Obesity, unspecified   Agent  orange exposure- on disabilty from. [place] Vet '69-70   Diastolic dysfunction grade 1 with an EF 60-65% 2D 11/30/12  PLAN:2 Days Post-Op Procedure(s) (LRB):  CORONARY ARTERY BYPASS GRAFTING (CABG) (N/A)  INTRAOPERATIVE TRANSESOPHAGEAL ECHOCARDIOGRAM  Poor diabetes control. On insulin drip,  Progressing though HR still mildly elevated on lopressor 25 BID, Lanoxin 0.25 daily.   LOS: 7 days   Time spent with pt. :15 minutes. Crimson Beer R 12/05/2012, 12:19 PM

## 2012-12-05 NOTE — Progress Notes (Addendum)
[address]            [place] [address]          [phone]      2 Days Post-Op Procedure(s) (LRB): CORONARY ARTERY BYPASS GRAFTING (CABG) (N/A) INTRAOPERATIVE TRANSESOPHAGEAL ECHOCARDIOGRAM (N/A)  Subjective: Patient is sitting in chair and using incentive spirometer. His only complaint is he is starting to have back spasms (had an accident years ago and has chronic back pain)  Objective: Vital signs in last 24 hours: Temp:  [97.7 F (36.5 C)-99.9 F (37.7 C)] 98.9 F (37.2 C) (04/09 0400) Pulse Rate:  [92-113] 101 (04/09 0700) Cardiac Rhythm:  [-] Normal sinus rhythm;Sinus tachycardia (04/09 0400) Resp:  [14-27] 21 (04/09 0700) BP: (92-147)/(51-79) 124/75 mmHg (04/09 0700) SpO2:  [92 %-99 %] 97 % (04/09 0700) Arterial Line BP: (130-137)/(60-63) 137/60 mmHg (04/08 0900) Weight:  [95.4 kg (210 lb 5.1 oz)] 95.4 kg (210 lb 5.1 oz) (04/09 0400)  Pre op weight  96.2 kg Current Weight  12/05/12 95.4 kg (210 lb 5.1 oz)    Hemodynamic parameters for last 24 hours: PAP: (39)/(17-21) 39/17 mmHg CO:  [6.7 L/min] 6.7 L/min CI:  [3.1 L/min/m2] 3.1 L/min/m2  Intake/Output from previous day: 04/08 0701 - 04/09 0700 In: 2329.5 [P.O.:1120; I.V.:709.5; IV Piggyback:500] Out: 3595 [Urine:3130; Drains:55; Chest Tube:410]   Physical Exam:  Cardiovascular: Slightly tachy Pulmonary: Diminished at bases; no rales, wheezes, or rhonchi. Abdomen: Soft, non tender, bowel sounds present. Extremities: Mild bilateral lower extremity edema. Wounds: Dressings are clean and dry.    Lab Results: CBC:  Recent Labs  12/04/12 1700 12/04/12 1750 12/05/12 0415  WBC 16.6*  --  13.7*  HGB 11.6* 11.6* 10.5*  HCT 33.4* 34.0* 30.6*  PLT 117*  --  97*   BMET:  Recent Labs  12/04/12 0449  12/04/12 1750 12/05/12 0415  NA 136  --  138 137  K 4.5  --  4.1 4.0  CL 102  --  99 100  CO2 26  --   --  29  GLUCOSE 94  --  123* 115*  BUN 6  --  6 9    CREATININE 0.57  < > 0.70 0.57  CALCIUM 8.8  --   --  8.7  < > = values in this interval not displayed.  PT/INR:  Lab Results  Component Value Date   INR 1.24 12/03/2012   INR 1.07 11/29/2012   ABG:  INR: Will add last result for INR, ABG once components are confirmed Will add last 4 CBG results once components are confirmed  Assessment/Plan:  1. CV - SR. On Lopressor 12.5 bid. Will increase to 25 bid for better HR control 2.  Pulmonary - CXR this am appears to show patient is rotated to the right,no pneumothorax, bibasilar atelectasis and small pleural effusions.Likely remove chest tubes.Encourage incentive spirometer and flutter valve. 3. Volume Overload - Had good diuresis with Lasix. Will give IV again today 4.  Acute blood loss anemia - H and H stable at 10.5 and 30.6. 5.Thrombocytopenia-platelets 97,000. 6.-CBGs 119/86/118. Pre op HGA1C 11.6. On Insulin pre op. Will gradually wean off insulin drip and restart insulin 7.JP from left lower extremity has had 55 cc of serosanguinous output. Remove 8.Patient requesting Flexeril PRN for back spasms (was in an accident and has Morphine pump) 9. Possible transfer later today vs am  ZIMMERMAN,DONIELLE MPA-C  12/05/2012,7:30 AM    Keep in ICU to transition off insulin drip- levemir ordered patient examined and medical record reviewed,agree with above note. VAN TRIGT III,Jary Louvier 12/05/2012

## 2012-12-05 NOTE — Progress Notes (Signed)
[facility] BRIEF SICU PROGRESS NOTE  2 Days Post-Op  S/P Procedure(s) (LRB): CORONARY ARTERY BYPASS GRAFTING (CABG) (N/A) INTRAOPERATIVE TRANSESOPHAGEAL ECHOCARDIOGRAM (N/A)   Stable day NSR w/ stable BP O2 sats 97% on 2 L/min Diuresing fairly well  Plan: Continue current plan  OWEN,CLARENCE H 12/05/2012 7:11 PM

## 2012-12-06 ENCOUNTER — Inpatient Hospital Stay (HOSPITAL_COMMUNITY): Payer: Non-veteran care

## 2012-12-06 LAB — TYPE AND SCREEN
ABO/RH(D): A POS
Antibody Screen: NEGATIVE
Unit division: 0
Unit division: 0

## 2012-12-06 LAB — COMPREHENSIVE METABOLIC PANEL
ALT: 24 U/L (ref 0–53)
AST: 22 U/L (ref 0–37)
Albumin: 2.6 g/dL — ABNORMAL LOW (ref 3.5–5.2)
Alkaline Phosphatase: 86 U/L (ref 39–117)
BUN: 11 mg/dL (ref 6–23)
CO2: 32 mEq/L (ref 19–32)
Calcium: 8.8 mg/dL (ref 8.4–10.5)
Chloride: 98 mEq/L (ref 96–112)
Creatinine, Ser: 0.62 mg/dL (ref 0.50–1.35)
GFR calc Af Amer: >90 mL/min (ref >90)
GFR calc non Af Amer: >90 mL/min (ref >90)
Glucose, Bld: 93 mg/dL (ref 70–99)
Potassium: 4 mEq/L (ref 3.5–5.1)
Sodium: 133 mEq/L — ABNORMAL LOW (ref 135–145)
Total Bilirubin: 0.4 mg/dL (ref 0.3–1.2)
Total Protein: 6.1 g/dL (ref 6.0–8.3)

## 2012-12-06 LAB — GLUCOSE, CAPILLARY
Glucose-Capillary: 83 mg/dL (ref 70–99)
Glucose-Capillary: 113 mg/dL — ABNORMAL HIGH (ref 70–99)
Glucose-Capillary: 163 mg/dL — ABNORMAL HIGH (ref 70–99)
Glucose-Capillary: 150 mg/dL — ABNORMAL HIGH (ref 70–99)
Glucose-Capillary: 167 mg/dL — ABNORMAL HIGH (ref 70–99)

## 2012-12-06 LAB — CBC
HCT: 30 % — ABNORMAL LOW (ref 39.0–52.0)
Hemoglobin: 10.3 g/dL — ABNORMAL LOW (ref 13.0–17.0)
MCH: 32.8 pg (ref 26.0–34.0)
MCHC: 34.3 g/dL (ref 30.0–36.0)
MCV: 95.5 fL (ref 78.0–100.0)
Platelets: 108 10*3/uL — ABNORMAL LOW (ref 150–400)
RBC: 3.14 MIL/uL — ABNORMAL LOW (ref 4.22–5.81)
RDW: 13 % (ref 11.5–15.5)
WBC: 11.3 10*3/uL — ABNORMAL HIGH (ref 4.0–10.5)

## 2012-12-06 MED ORDER — SODIUM CHLORIDE 0.9 % IJ SOLN: 3.0000 mL | INTRAMUSCULAR | Status: DC | PRN

## 2012-12-06 MED ORDER — FUROSEMIDE 40 MG PO TABS
40.0000 mg | ORAL_TABLET | Freq: Every day | ORAL | Status: DC
  Administered 2012-12-06 – 2012-12-07 (×2): 40 mg via ORAL
  Filled 2012-12-06 (×2): qty 1

## 2012-12-06 MED ORDER — POTASSIUM CHLORIDE CRYS ER 20 MEQ PO TBCR
20.0000 meq | EXTENDED_RELEASE_TABLET | Freq: Every day | ORAL | Status: DC
  Administered 2012-12-06: 20 meq via ORAL
  Filled 2012-12-06 (×2): qty 1

## 2012-12-06 MED ORDER — INSULIN ASPART 100 UNIT/ML ~~LOC~~ SOLN
0.0000 [IU] | Freq: Three times a day (TID) | SUBCUTANEOUS | Status: DC
  Administered 2012-12-06: 2 [IU] via SUBCUTANEOUS
  Administered 2012-12-06 (×2): 4 [IU] via SUBCUTANEOUS
  Administered 2012-12-07: 8 [IU] via SUBCUTANEOUS

## 2012-12-06 MED ORDER — SODIUM CHLORIDE 0.9 % IJ SOLN
3.0000 mL | Freq: Two times a day (BID) | INTRAMUSCULAR | Status: DC
  Administered 2012-12-06 – 2012-12-07 (×2): 3 mL via INTRAVENOUS

## 2012-12-06 MED ORDER — SODIUM CHLORIDE 0.9 % IV SOLN: 250.0000 mL | INTRAVENOUS | Status: DC | PRN

## 2012-12-06 MED ORDER — MOVING RIGHT ALONG BOOK
Freq: Once | Status: AC
  Administered 2012-12-06: 10:00:00
  Filled 2012-12-06: qty 1

## 2012-12-06 MED ORDER — POLYETHYLENE GLYCOL 3350 17 G PO PACK
17.0000 g | PACK | Freq: Once | ORAL | Status: AC
  Administered 2012-12-06: 17 g via ORAL
  Filled 2012-12-06: qty 1

## 2012-12-06 MED ORDER — PANTOPRAZOLE SODIUM 40 MG PO TBEC: 40.0000 mg | DELAYED_RELEASE_TABLET | Freq: Every day | ORAL | Status: DC

## 2012-12-06 NOTE — Progress Notes (Addendum)
                   [address]            [place] [address]          [phone]      3 Days Post-Op Procedure(s) (LRB): CORONARY ARTERY BYPASS GRAFTING (CABG) (N/A) INTRAOPERATIVE TRANSESOPHAGEAL ECHOCARDIOGRAM (N/A)  Subjective: Patient with constipation (has a history). He is passing flatus but no bowel movement yet.  Objective: Vital signs in last 24 hours: Temp:  [97.9 F (36.6 C)-98.6 F (37 C)] 98.1 F (36.7 C) (04/09 2000) Pulse Rate:  [92-117] 110 (04/10 0700) Cardiac Rhythm:  [-] Normal sinus rhythm;Sinus tachycardia (04/09 2000) Resp:  [11-26] 21 (04/10 0700) BP: (86-124)/(57-83) 119/71 mmHg (04/10 0530) SpO2:  [88 %-99 %] 96 % (04/10 0700) Weight:  [94.394 kg (208 lb 1.6 oz)] 94.394 kg (208 lb 1.6 oz) (04/10 0443)  Pre op weight  96.2 kg Current Weight  12/06/12 94.394 kg (208 lb 1.6 oz)      Intake/Output from previous day: 04/09 0701 - 04/10 0700 In: 358.5 [P.O.:120; I.V.:238.5] Out: 2152 [Urine:2050; Drains:2; Chest Tube:100]   Physical Exam:  Cardiovascular: Slightly tachy Pulmonary: Diminished at bases; no rales, wheezes, or rhonchi. Abdomen: Soft, non tender, bowel sounds present. Extremities: Mild bilateral lower extremity edema. Ecchymosis of left thigh. Chronic venous stasis changes RLE Wounds: Clean and dry. Skin edges of sternum slightly red. No drainage.  Lab Results: CBC:  Recent Labs  12/05/12 0415 12/06/12 0345  WBC 13.7* 11.3*  HGB 10.5* 10.3*  HCT 30.6* 30.0*  PLT 97* 108*   BMET:   Recent Labs  12/05/12 0415 12/06/12 0345  NA 137 133*  K 4.0 4.0  CL 100 98  CO2 29 32  GLUCOSE 115* 93  BUN 9 11  CREATININE 0.57 0.62  CALCIUM 8.7 8.8    PT/INR:  Lab Results  Component Value Date   INR 1.24 12/03/2012   INR 1.07 11/29/2012   ABG:  INR: Will add last result for INR, ABG once components are confirmed Will add last 4 CBG results once components are confirmed  Assessment/Plan:  1. CV - SR. On  Lopressor 25 bid and Digoxin 0.25 daily. Would like to titrate Lopressor but bp may not allow yet. 2.  Pulmonary - CXR this am appears stable (no pneumothorax, bibasilar atelectasis and small pleural effusions).Encourage incentive spirometer and flutter valve. 3. Volume Overload - Continue with Lasix 4.  Acute blood loss anemia - H and H stable at 10.3 and 30. 5.Thrombocytopenia-platelets up to 108,000. 6.-CBGs 186/119/83. Pre op HGA1C 11.6. On Insulin 45 bid and tid with meals. 7.Mild hyponatremia-related to diuresis 8.Patient requesting Flexeril PRN for back spasms (was in an accident and has Morphine pump) 9.LOC constipation 10. Transfer to [facility]  ZIMMERMAN,DONIELLE MPA-C 12/06/2012,7:30 AM   Feels much better today. Now on 2000. Ambulating well  CBG good control

## 2012-12-06 NOTE — Progress Notes (Signed)
CARDIAC REHAB PHASE I   PRE:  Rate/Rhythm: 102 ST  BP:  Supine:   Sitting:   Standing: 129/78   SaO2: 88 RA  MODE:  Ambulation: 510 ft   POST:  Rate/Rhythm: 112 ST  BP:  Supine:   Sitting: 142/78  Standing:    SaO2: 91 2L 1445-1510 On arrival pt had O2 off RA sat 88%. Assisted X 1 used walker and O2 2L to ambulate. Gait steady with walker. VS stable. Pt talked nonstop during walk. Pt back to bed after walk with call light in reach.  Melina Copa RN 12/06/2012 3:51 PM

## 2012-12-06 NOTE — Progress Notes (Signed)
Nutrition Brief Note  Malnutrition Screening Tool result is inaccurate.  Please consult if nutrition needs are identified.  Katie Taylon Coole, RD, LDN Pager #: [phone] After-Hours Pager #: [phone]  

## 2012-12-06 NOTE — Progress Notes (Signed)
[facility]  DAILY PROGRESS NOTE   Subjective:  Main concern is back spasm. No chest pain. Ambulating without difficulty.  Objective:  Temp:  [97.9 F (36.6 C)-98.6 F (37 C)] 98 F (36.7 C) (04/10 0750) Pulse Rate:  [92-117] 109 (04/10 0900) Resp:  [11-26] 20 (04/10 1000) BP: (86-124)/(57-83) 119/71 mmHg (04/10 0530) SpO2:  [91 %-99 %] 96 % (04/10 0900) Weight:  [94.394 kg (208 lb 1.6 oz)] 94.394 kg (208 lb 1.6 oz) (04/10 0443) Weight change: -1.006 kg (-2 lb 3.5 oz)  Intake/Output from previous day: 04/09 0701 - 04/10 0700 In: 498.5 [P.O.:120; I.V.:378.5] Out: 2152 [Urine:2050; Drains:2; Chest Tube:100]  Intake/Output from this shift: Total I/O In: 140 [P.O.:120; I.V.:20] Out: -   Medications: Current Facility-Administered Medications  Medication Dose Route Frequency Provider Last Rate Last Dose  . 0.9 %  sodium chloride infusion  250 mL Intravenous PRN Ardelle Balls, PA-C      . acetaminophen (TYLENOL) tablet 1,000 mg  1,000 mg Oral Q6H Erin Barrett, PA-C   1,000 mg at 12/06/12 5621   Or  . acetaminophen (TYLENOL) solution 975 mg  975 mg Per Tube Q6H Erin Barrett, PA-C      . ALPRAZolam Prudy Feeler) tablet 0.25 mg  0.25 mg Oral TID PRN Abelino Derrick, PA-C      . aspirin EC tablet 325 mg  325 mg Oral Daily Erin Barrett, PA-C   325 mg at 12/05/12 1019  . atorvastatin (LIPITOR) tablet 20 mg  20 mg Oral q1800 Wilburt Finlay, PA-C   20 mg at 12/05/12 1737  . bisacodyl (DULCOLAX) EC tablet 10 mg  10 mg Oral Daily Erin Barrett, PA-C   10 mg at 12/05/12 1019   Or  . bisacodyl (DULCOLAX) suppository 10 mg  10 mg Rectal Daily Erin Barrett, PA-C      . budesonide-formoterol (SYMBICORT) 160-4.5 MCG/ACT inhaler 2 puff  2 puff Inhalation BID Kerin Perna, MD   2 puff at 12/06/12 [phone]  . cyclobenzaprine (FLEXERIL) tablet 10 mg  10 mg Oral TID PRN Ardelle Balls, PA-C   10 mg at 12/06/12 0819  . digoxin (LANOXIN) tablet 0.25 mg  0.25 mg Oral Daily  Kerin Perna, MD      . docusate sodium (COLACE) capsule 200 mg  200 mg Oral Daily Erin Barrett, PA-C   200 mg at 12/05/12 1019  . furosemide (LASIX) tablet 40 mg  40 mg Oral Daily Donielle Margaretann Loveless, PA-C      . gabapentin (NEURONTIN) capsule 800 mg  800 mg Oral TID Wilburt Finlay, PA-C   800 mg at 12/05/12 2224  . insulin aspart (novoLOG) injection 0-24 Units  0-24 Units Subcutaneous TID AC & HS Donielle Margaretann Loveless, PA-C      . insulin aspart (novoLOG) injection 6 Units  6 Units Subcutaneous TID WC Kerin Perna, MD   6 Units at 12/05/12 1839  . insulin detemir (LEVEMIR) injection 45 Units  45 Units Subcutaneous BID Kerin Perna, MD   45 Units at 12/05/12 2224  . levalbuterol [facility] HFA) inhaler 2 puff  2 puff Inhalation Q6H Kerin Perna, MD   2 puff at 12/06/12 [phone]  . metoprolol tartrate (LOPRESSOR) tablet 25 mg  25 mg Oral BID Ardelle Balls, PA-C   25 mg at 12/06/12 0819  . moving right along book   Does not apply Once Ardelle Balls, PA-C      . nicotine (NICODERM  CQ - dosed in mg/24 hours) patch 14 mg  14 mg Transdermal Daily Donielle Margaretann Loveless, PA-C   14 mg at 12/05/12 1041  . ondansetron (ZOFRAN) injection 4 mg  4 mg Intravenous Q6H PRN Erin Barrett, PA-C   4 mg at 12/05/12 1350  . oxyCODONE (Oxy IR/ROXICODONE) immediate release tablet 5-10 mg  5-10 mg Oral Q3H PRN Erin Barrett, PA-C   10 mg at 12/06/12 0619  . pantoprazole (PROTONIX) EC tablet 40 mg  40 mg Oral Daily Erin Barrett, PA-C   40 mg at 12/05/12 1019  . [START ON 12/07/2012] pantoprazole (PROTONIX) EC tablet 40 mg  40 mg Oral QAC breakfast Donielle Margaretann Loveless, PA-C      . polyethylene glycol (MIRALAX / GLYCOLAX) packet 17 g  17 g Oral Once Donielle M Zimmerman, PA-C      . potassium chloride SA (K-DUR,KLOR-CON) CR tablet 20 mEq  20 mEq Oral Daily Donielle M Zimmerman, PA-C      . sertraline (ZOLOFT) tablet 50 mg  50 mg Oral Daily Erin Barrett, PA-C   50 mg at 12/05/12 1019  . sodium chloride 0.9 %  injection 3 mL  3 mL Intravenous Q12H Donielle M Zimmerman, PA-C      . sodium chloride 0.9 % injection 3 mL  3 mL Intravenous PRN Ardelle Balls, PA-C        Physical Exam: General appearance: alert and no distress Neck: no adenopathy, no carotid bruit, no JVD, supple, symmetrical, trachea midline and thyroid not enlarged, symmetric, no tenderness/mass/nodules Lungs: clear to auscultation bilaterally Heart: regular rate and rhythm Abdomen: soft, non-tender; bowel sounds normal; no masses,  no organomegaly Extremities: extremities normal, atraumatic, no cyanosis or edema Pulses: 2+ and symmetric Skin: Skin color, texture, turgor normal. No rashes or lesions Neurologic: Grossly normal  Lab Results: Results for orders placed during the hospital encounter of 11/28/12 (from the past 48 hour(s))  GLUCOSE, CAPILLARY     Status: Abnormal   Collection Time    12/04/12 11:43 AM      Result Value Range   Glucose-Capillary 215 (*) 70 - 99 mg/dL  GLUCOSE, CAPILLARY     Status: Abnormal   Collection Time    12/04/12 12:39 PM      Result Value Range   Glucose-Capillary 193 (*) 70 - 99 mg/dL  GLUCOSE, CAPILLARY     Status: Abnormal   Collection Time    12/04/12  2:51 PM      Result Value Range   Glucose-Capillary 130 (*) 70 - 99 mg/dL  GLUCOSE, CAPILLARY     Status: None   Collection Time    12/04/12  3:51 PM      Result Value Range   Glucose-Capillary 94  70 - 99 mg/dL  MAGNESIUM     Status: None   Collection Time    12/04/12  5:00 PM      Result Value Range   Magnesium 2.1  1.5 - 2.5 mg/dL  CBC     Status: Abnormal   Collection Time    12/04/12  5:00 PM      Result Value Range   WBC 16.6 (*) 4.0 - 10.5 K/uL   RBC 3.53 (*) 4.22 - 5.81 MIL/uL   Hemoglobin 11.6 (*) 13.0 - 17.0 g/dL   HCT 16.1 (*) 09.6 - 04.5 %   MCV 94.6  78.0 - 100.0 fL   MCH 32.9  26.0 - 34.0 pg   MCHC 34.7  30.0 - 36.0  g/dL   RDW 52.8  41.3 - 24.4 %   Platelets 117 (*) 150 - 400 K/uL   Comment:  CONSISTENT WITH PREVIOUS RESULT  CREATININE, SERUM     Status: None   Collection Time    12/04/12  5:00 PM      Result Value Range   Creatinine, Ser 0.61  0.50 - 1.35 mg/dL   GFR calc non Af Amer >90  >90 mL/min   GFR calc Af Amer >90  >90 mL/min   Comment:            The eGFR has been calculated     using the CKD EPI equation.     This calculation has not been     validated in all clinical     situations.     eGFR's persistently     <90 mL/min signify     possible Chronic Kidney Disease.  GLUCOSE, CAPILLARY     Status: None   Collection Time    12/04/12  5:00 PM      Result Value Range   Glucose-Capillary 82  70 - 99 mg/dL  POCT I-STAT, CHEM 8     Status: Abnormal   Collection Time    12/04/12  5:50 PM      Result Value Range   Sodium 138  135 - 145 mEq/L   Potassium 4.1  3.5 - 5.1 mEq/L   Chloride 99  96 - 112 mEq/L   BUN 6  6 - 23 mg/dL   Creatinine, Ser 0.10  0.50 - 1.35 mg/dL   Glucose, Bld 272 (*) 70 - 99 mg/dL   Calcium, Ion 5.36  6.44 - 1.30 mmol/L   TCO2 30  0 - 100 mmol/L   Hemoglobin 11.6 (*) 13.0 - 17.0 g/dL   HCT 03.4 (*) 74.2 - 59.5 %  GLUCOSE, CAPILLARY     Status: Abnormal   Collection Time    12/04/12  7:51 PM      Result Value Range   Glucose-Capillary 229 (*) 70 - 99 mg/dL  GLUCOSE, CAPILLARY     Status: Abnormal   Collection Time    12/04/12  8:54 PM      Result Value Range   Glucose-Capillary 230 (*) 70 - 99 mg/dL  GLUCOSE, CAPILLARY     Status: Abnormal   Collection Time    12/04/12  9:58 PM      Result Value Range   Glucose-Capillary 193 (*) 70 - 99 mg/dL  GLUCOSE, CAPILLARY     Status: Abnormal   Collection Time    12/04/12 11:04 PM      Result Value Range   Glucose-Capillary 156 (*) 70 - 99 mg/dL  GLUCOSE, CAPILLARY     Status: Abnormal   Collection Time    12/04/12 11:59 PM      Result Value Range   Glucose-Capillary 117 (*) 70 - 99 mg/dL  GLUCOSE, CAPILLARY     Status: None   Collection Time    12/05/12  1:08 AM      Result  Value Range   Glucose-Capillary 83  70 - 99 mg/dL  GLUCOSE, CAPILLARY     Status: None   Collection Time    12/05/12  2:03 AM      Result Value Range   Glucose-Capillary 71  70 - 99 mg/dL  GLUCOSE, CAPILLARY     Status: Abnormal   Collection Time    12/05/12  2:57 AM  Result Value Range   Glucose-Capillary 109 (*) 70 - 99 mg/dL  GLUCOSE, CAPILLARY     Status: Abnormal   Collection Time    12/05/12  4:00 AM      Result Value Range   Glucose-Capillary 119 (*) 70 - 99 mg/dL  BASIC METABOLIC PANEL     Status: Abnormal   Collection Time    12/05/12  4:15 AM      Result Value Range   Sodium 137  135 - 145 mEq/L   Potassium 4.0  3.5 - 5.1 mEq/L   Chloride 100  96 - 112 mEq/L   CO2 29  19 - 32 mEq/L   Glucose, Bld 115 (*) 70 - 99 mg/dL   BUN 9  6 - 23 mg/dL   Creatinine, Ser 4.78  0.50 - 1.35 mg/dL   Calcium 8.7  8.4 - 29.5 mg/dL   GFR calc non Af Amer >90  >90 mL/min   GFR calc Af Amer >90  >90 mL/min   Comment:            The eGFR has been calculated     using the CKD EPI equation.     This calculation has not been     validated in all clinical     situations.     eGFR's persistently     <90 mL/min signify     possible Chronic Kidney Disease.  CBC     Status: Abnormal   Collection Time    12/05/12  4:15 AM      Result Value Range   WBC 13.7 (*) 4.0 - 10.5 K/uL   RBC 3.25 (*) 4.22 - 5.81 MIL/uL   Hemoglobin 10.5 (*) 13.0 - 17.0 g/dL   HCT 62.1 (*) 30.8 - 65.7 %   MCV 94.2  78.0 - 100.0 fL   MCH 32.3  26.0 - 34.0 pg   MCHC 34.3  30.0 - 36.0 g/dL   RDW 84.6  96.2 - 95.2 %   Platelets 97 (*) 150 - 400 K/uL   Comment: CONSISTENT WITH PREVIOUS RESULT  GLUCOSE, CAPILLARY     Status: None   Collection Time    12/05/12  5:05 AM      Result Value Range   Glucose-Capillary 86  70 - 99 mg/dL  GLUCOSE, CAPILLARY     Status: Abnormal   Collection Time    12/05/12  6:02 AM      Result Value Range   Glucose-Capillary 118 (*) 70 - 99 mg/dL  GLUCOSE, CAPILLARY     Status:  Abnormal   Collection Time    12/05/12  6:56 AM      Result Value Range   Glucose-Capillary 120 (*) 70 - 99 mg/dL  GLUCOSE, CAPILLARY     Status: Abnormal   Collection Time    12/05/12  7:44 AM      Result Value Range   Glucose-Capillary 133 (*) 70 - 99 mg/dL  GLUCOSE, CAPILLARY     Status: Abnormal   Collection Time    12/05/12  9:04 AM      Result Value Range   Glucose-Capillary 125 (*) 70 - 99 mg/dL  GLUCOSE, CAPILLARY     Status: Abnormal   Collection Time    12/05/12 10:20 AM      Result Value Range   Glucose-Capillary 162 (*) 70 - 99 mg/dL  GLUCOSE, CAPILLARY     Status: Abnormal   Collection Time    12/05/12 11:32 AM  Result Value Range   Glucose-Capillary 138 (*) 70 - 99 mg/dL  GLUCOSE, CAPILLARY     Status: Abnormal   Collection Time    12/05/12 12:40 PM      Result Value Range   Glucose-Capillary 106 (*) 70 - 99 mg/dL   Comment 1 Documented in Chart    GLUCOSE, CAPILLARY     Status: None   Collection Time    12/05/12  1:47 PM      Result Value Range   Glucose-Capillary 98  70 - 99 mg/dL  GLUCOSE, CAPILLARY     Status: Abnormal   Collection Time    12/05/12  4:07 PM      Result Value Range   Glucose-Capillary 134 (*) 70 - 99 mg/dL  GLUCOSE, CAPILLARY     Status: Abnormal   Collection Time    12/05/12  8:14 PM      Result Value Range   Glucose-Capillary 186 (*) 70 - 99 mg/dL  GLUCOSE, CAPILLARY     Status: Abnormal   Collection Time    12/05/12 11:42 PM      Result Value Range   Glucose-Capillary 119 (*) 70 - 99 mg/dL  CBC     Status: Abnormal   Collection Time    12/06/12  3:45 AM      Result Value Range   WBC 11.3 (*) 4.0 - 10.5 K/uL   RBC 3.14 (*) 4.22 - 5.81 MIL/uL   Hemoglobin 10.3 (*) 13.0 - 17.0 g/dL   HCT 16.1 (*) 09.6 - 04.5 %   MCV 95.5  78.0 - 100.0 fL   MCH 32.8  26.0 - 34.0 pg   MCHC 34.3  30.0 - 36.0 g/dL   RDW 40.9  81.1 - 91.4 %   Platelets 108 (*) 150 - 400 K/uL   Comment: CONSISTENT WITH PREVIOUS RESULT  COMPREHENSIVE  METABOLIC PANEL     Status: Abnormal   Collection Time    12/06/12  3:45 AM      Result Value Range   Sodium 133 (*) 135 - 145 mEq/L   Potassium 4.0  3.5 - 5.1 mEq/L   Chloride 98  96 - 112 mEq/L   CO2 32  19 - 32 mEq/L   Glucose, Bld 93  70 - 99 mg/dL   BUN 11  6 - 23 mg/dL   Creatinine, Ser 7.82  0.50 - 1.35 mg/dL   Calcium 8.8  8.4 - 95.6 mg/dL   Total Protein 6.1  6.0 - 8.3 g/dL   Albumin 2.6 (*) 3.5 - 5.2 g/dL   AST 22  0 - 37 U/L   ALT 24  0 - 53 U/L   Alkaline Phosphatase 86  39 - 117 U/L   Total Bilirubin 0.4  0.3 - 1.2 mg/dL   GFR calc non Af Amer >90  >90 mL/min   GFR calc Af Amer >90  >90 mL/min   Comment:            The eGFR has been calculated     using the CKD EPI equation.     This calculation has not been     validated in all clinical     situations.     eGFR's persistently     <90 mL/min signify     possible Chronic Kidney Disease.  GLUCOSE, CAPILLARY     Status: None   Collection Time    12/06/12  3:48 AM      Result Value Range  Glucose-Capillary 83  70 - 99 mg/dL  GLUCOSE, CAPILLARY     Status: Abnormal   Collection Time    12/06/12  7:49 AM      Result Value Range   Glucose-Capillary 113 (*) 70 - 99 mg/dL    Imaging: Dg Chest 2 View  12/06/2012  *RADIOLOGY REPORT*  Clinical Data: Post CABG, chest soreness  CHEST - 2 VIEW  Comparison: Portable chest x-ray of 12/05/2012  Findings: The left chest tube has been removed.  No pneumothorax is seen.  A venous sheath remains in the SVC.  Small effusions remain and there is mild basilar atelectasis, right greater than left.  IMPRESSION: Left chest tube removed.  No pneumothorax.  Persistent small effusions with basilar atelectasis.   Original Report Authenticated By: Dwyane Dee, M.D.    Dg Chest Port 1 View  12/05/2012  *RADIOLOGY REPORT*  Clinical Data: Postop CABG  PORTABLE CHEST - 1 VIEW  Comparison: 12/04/2012  Findings: Interval removal of right IJ Swan-Ganz venous catheter. Stable right IJ venous sheath.   Stable left chest tube and mediastinal drain.  No pneumothorax is seen.  Mild bibasilar atelectasis.  Mild cardiomegaly. Postsurgical changes related to prior CABG.  IMPRESSION: Stable left chest tube and mediastinal drain.  No pneumothorax is seen.   Original Report Authenticated By: Charline Bills, M.D.     Assessment:  1. Principal Problem: 2.   Unstable angina 3. Active Problems: 4.   DIABETES MELLITUS 5.   HYPERLIPIDEMIA 6.   HYPERTENSION 7.   BACK PAIN, CHRONIC- followed at pain clinic, he has an implantable MSO4 pump 8.   CAD h/o PCI,  progression 11/29/12- CABG - X3 12/03/12 9.   Tobacco abuse 10.   Obesity, unspecified 11.   Agent orange exposure- on disabilty from. [place] Vet '69-70 12.   Diastolic dysfunction grade 1 with an EF 60-65% 2D 11/30/12 13.   Plan:   1. Doing well post-op. Blood pressure around 100 systolic. HR mildly improved, but still tachycardic. Ambulating well.   Time Spent Directly with Patient:  15 minutes  Length of Stay:  LOS: 8 days   Chrystie Nose, MD, [facility] Attending Cardiologist [facility]  HILTY,Kenneth C 12/06/2012, 10:48 AM

## 2012-12-06 NOTE — Progress Notes (Signed)
Patient ambulated prior to transfer on 2300 early this AM. Cerina Leary, Randall An RN

## 2012-12-07 DIAGNOSIS — Z951 Presence of aortocoronary bypass graft: Secondary | ICD-10-CM

## 2012-12-07 LAB — CBC
MCHC: 34.2 g/dL (ref 30.0–36.0)
MCV: 94.3 fL (ref 78.0–100.0)
Platelets: 147 10*3/uL — ABNORMAL LOW (ref 150–400)
RDW: 12.7 % (ref 11.5–15.5)
WBC: 11 10*3/uL — ABNORMAL HIGH (ref 4.0–10.5)

## 2012-12-07 LAB — BASIC METABOLIC PANEL
BUN: 15 mg/dL (ref 6–23)
Calcium: 9.1 mg/dL (ref 8.4–10.5)
Chloride: 99 mEq/L (ref 96–112)
Creatinine, Ser: 0.74 mg/dL (ref 0.50–1.35)
GFR calc Af Amer: >90 mL/min (ref >90)
GFR calc non Af Amer: >90 mL/min (ref >90)

## 2012-12-07 LAB — GLUCOSE, CAPILLARY: Glucose-Capillary: 87 mg/dL (ref 70–99)

## 2012-12-07 MED ORDER — BUDESONIDE-FORMOTEROL FUMARATE 160-4.5 MCG/ACT IN AERO: 2.0000 | INHALATION_SPRAY | Freq: Two times a day (BID) | RESPIRATORY_TRACT | Status: AC

## 2012-12-07 MED ORDER — METOPROLOL TARTRATE 25 MG PO TABS: 37.5000 mg | ORAL_TABLET | Freq: Two times a day (BID) | ORAL | Status: DC

## 2012-12-07 MED ORDER — DIGOXIN 250 MCG PO TABS: 0.2500 mg | ORAL_TABLET | Freq: Every day | ORAL | Status: AC

## 2012-12-07 MED ORDER — POTASSIUM CHLORIDE ER 10 MEQ PO TBCR: 10.0000 meq | EXTENDED_RELEASE_TABLET | Freq: Every day | ORAL | Status: DC

## 2012-12-07 MED ORDER — FUROSEMIDE 40 MG PO TABS: 40.0000 mg | ORAL_TABLET | Freq: Every day | ORAL | Status: DC

## 2012-12-07 MED ORDER — DSS 100 MG PO CAPS: 200.0000 mg | ORAL_CAPSULE | Freq: Two times a day (BID) | ORAL | Status: DC | PRN

## 2012-12-07 MED ORDER — METOPROLOL TARTRATE 25 MG PO TABS
37.5000 mg | ORAL_TABLET | Freq: Two times a day (BID) | ORAL | Status: DC
  Administered 2012-12-07: 37.5 mg via ORAL
  Filled 2012-12-07 (×2): qty 1

## 2012-12-07 MED ORDER — OXYCODONE HCL 5 MG PO TABS: 5.0000 mg | ORAL_TABLET | ORAL | Status: DC | PRN

## 2012-12-07 MED ORDER — LEVALBUTEROL TARTRATE 45 MCG/ACT IN AERO: 2.0000 | INHALATION_SPRAY | Freq: Four times a day (QID) | RESPIRATORY_TRACT | Status: AC

## 2012-12-07 NOTE — Progress Notes (Signed)
4 Days Post-Op Procedure(s) (LRB): CORONARY ARTERY BYPASS GRAFTING (CABG) (N/A) INTRAOPERATIVE TRANSESOPHAGEAL ECHOCARDIOGRAM (N/A) Subjective:  [name] wants to go home.  He states he can ambulate independently and get in and out of bed without assistance.  No BM, however patient states this is not unusual for him, due to indwelling Morphine pump  Objective: Vital signs in last 24 hours: Temp:  [97.5 F (36.4 C)-98.2 F (36.8 C)] 98.2 F (36.8 C) (04/11 0353) Pulse Rate:  [92-110] 98 (04/11 0353) Cardiac Rhythm:  [-] Normal sinus rhythm;Sinus tachycardia (04/10 1951) Resp:  [17-20] 18 (04/11 0353) BP: (94-112)/(53-60) 112/60 mmHg (04/11 0353) SpO2:  [91 %-97 %] 95 % (04/11 0353) Weight:  [206 lb (93.441 kg)] 206 lb (93.441 kg) (04/11 0641)  Intake/Output from previous day: 04/10 0701 - 04/11 0700 In: 380 [P.O.:360; I.V.:20] Out: -   General appearance: alert, cooperative and no distress Neurologic: intact Heart: regular rate and rhythm and tachy Lungs: clear to auscultation bilaterally Abdomen: soft, non-tender; bowel sounds normal; no masses,  no organomegaly Extremities: edema trace Wound: LLE clean and dry, ecchymotic  Lab Results:  Recent Labs  12/06/12 0345 12/07/12 0520  WBC 11.3* 11.0*  HGB 10.3* 10.2*  HCT 30.0* 29.8*  PLT 108* 147*   BMET:  Recent Labs  12/06/12 0345 12/07/12 0520  NA 133* 135  K 4.0 5.4*  CL 98 99  CO2 32 32  GLUCOSE 93 104*  BUN 11 15  CREATININE 0.62 0.74  CALCIUM 8.8 9.1    PT/INR: No results found for this basename: LABPROT, INR,  in the last 72 hours ABG    Component Value Date/Time   PHART 7.425 12/04/2012 0407   HCO3 30.1* 12/04/2012 0407   TCO2 30 12/04/2012 1750   O2SAT 97.0 12/04/2012 0407   CBG (last 3)   Recent Labs  12/06/12 1613 12/06/12 2018 12/07/12 0635  GLUCAP 150* 167* 87    Assessment/Plan: S/P Procedure(s) (LRB): CORONARY ARTERY BYPASS GRAFTING (CABG) (N/A) INTRAOPERATIVE TRANSESOPHAGEAL  ECHOCARDIOGRAM (N/A)  1. CV- NSR on Lopressor, Digoxin- remains Tachy, pressure controlled 2. Pulm- no acute issues, off oxygen continue IS 3. Renal- creatinine ok, hyperkalemic, volume status stable 4. Anemia- stable, Hgb 10.3 5. DM- uncontrolled as outpatient, good control in hospital will continue current regimen 6. LOC Constipation- chronic problem for patient 7. Dispo- will d/c EPW, if no arrythmia, will d/c this afternoon   LOS: 9 days    Dylan Church 12/07/2012

## 2012-12-07 NOTE — Progress Notes (Signed)
1100AM  Pt EPW pulled per protocol and as ordered. All ends intact pt SR 92 on the monitor pt reminded to lie supine approximately one hour. Will continue to closely monitor patient. CT sutures removed as ordered steri strips applied. Makhiya Coburn, Randall An RN

## 2012-12-07 NOTE — Progress Notes (Signed)
Pt ambulated 400 ft on rm air with the assistance of a rolling walker. Pt tolerated activity very well and is now resting in chair.   Anouk Critzer M

## 2012-12-07 NOTE — Progress Notes (Signed)
4 Days Post-Op Procedure(s) (LRB):  CORONARY ARTERY BYPASS GRAFTING (CABG) (N/A)  INTRAOPERATIVE TRANSESOPHAGEAL ECHOCARDIOGRAM    Subjective: Wants to go home  Objective: Vital signs in last 24 hours: Temp:  [97.5 F (36.4 C)-98.2 F (36.8 C)] 98.2 F (36.8 C) (04/11 0353) Pulse Rate:  [92-109] 98 (04/11 0353) Resp:  [17-20] 18 (04/11 0353) BP: (94-112)/(53-60) 112/60 mmHg (04/11 0353) SpO2:  [91 %-97 %] 95 % (04/11 0353) Weight:  [93.441 kg (206 lb)] 93.441 kg (206 lb) (04/11 0641) Weight change: -0.953 kg (-2 lb 1.6 oz) Last BM Date: 12/03/12 Intake/Output from previous day: +400 04/10 0701 - 04/11 0700 In: 380 [P.O.:360; I.V.:20] Out: -  Intake/Output this shift:    PE: General: No distress No JVD Heart: RRR rate 100, no rub Lungs: no wheezing Abd: soft Ext: venous stasis changes, mild edema    Lab Results:  Recent Labs  12/06/12 0345 12/07/12 0520  WBC 11.3* 11.0*  HGB 10.3* 10.2*  HCT 30.0* 29.8*  PLT 108* 147*   BMET  Recent Labs  12/06/12 0345 12/07/12 0520  NA 133* 135  K 4.0 5.4*  CL 98 99  CO2 32 32  GLUCOSE 93 104*  BUN 11 15  CREATININE 0.62 0.74  CALCIUM 8.8 9.1   No results found for this basename: TROPONINI, CK, MB,  in the last 72 hours  Lab Results  Component Value Date   CHOL 109 11/29/2012   HDL 21* 11/29/2012   LDLCALC 48 11/29/2012   TRIG 200* 11/29/2012   CHOLHDL 5.2 11/29/2012   Lab Results  Component Value Date   HGBA1C 11.6* 11/29/2012     No results found for this basename: TSH    Hepatic Function Panel  Recent Labs  12/06/12 0345  PROT 6.1  ALBUMIN 2.6*  AST 22  ALT 24  ALKPHOS 86  BILITOT 0.4    Studies/Results: Dg Chest 2 View  12/06/2012  *RADIOLOGY REPORT*  Clinical Data: Post CABG, chest soreness  CHEST - 2 VIEW  Comparison: Portable chest x-ray of 12/05/2012  Findings: The left chest tube has been removed.  No pneumothorax is seen.  A venous sheath remains in the SVC.  Small effusions remain and there is  mild basilar atelectasis, right greater than left.  IMPRESSION: Left chest tube removed.  No pneumothorax.  Persistent small effusions with basilar atelectasis.   Original Report Authenticated By: Dwyane Dee, M.D.     Medications: I have reviewed the patient's current medications. Scheduled Meds: . acetaminophen  1,000 mg Oral Q6H   Or  . acetaminophen (TYLENOL) oral liquid 160 mg/5 mL  975 mg Per Tube Q6H  . aspirin EC  325 mg Oral Daily  . atorvastatin  20 mg Oral q1800  . bisacodyl  10 mg Oral Daily   Or  . bisacodyl  10 mg Rectal Daily  . budesonide-formoterol  2 puff Inhalation BID  . digoxin  0.25 mg Oral Daily  . docusate sodium  200 mg Oral Daily  . furosemide  40 mg Oral Daily  . gabapentin  800 mg Oral TID  . insulin aspart  0-24 Units Subcutaneous TID AC & HS  . insulin aspart  6 Units Subcutaneous TID WC  . insulin detemir  45 Units Subcutaneous BID  . levalbuterol  2 puff Inhalation Q6H  . metoprolol tartrate  25 mg Oral BID  . nicotine  14 mg Transdermal Daily  . pantoprazole  40 mg Oral Daily  . sertraline  50 mg  Oral Daily  . sodium chloride  3 mL Intravenous Q12H   Continuous Infusions:  PRN Meds:.sodium chloride, ALPRAZolam, cyclobenzaprine, ondansetron (ZOFRAN) IV, oxyCODONE, sodium chloride  Assessment/Plan: Principal Problem:   Unstable angina Active Problems:   DIABETES MELLITUS   HYPERLIPIDEMIA   HYPERTENSION   BACK PAIN, CHRONIC- followed at pain clinic, he has an implantable MSO4 pump   CAD h/o PCI,  progression 11/29/12- CABG - X3 12/03/12   Tobacco abuse   Obesity, unspecified   Agent orange exposure- on disabilty from. [place] Vet '69-70   Diastolic dysfunction grade 1 with an EF 60-65% 2D 11/30/12  PLAN: Hyperkalemic Stable anemia VS stable, HR slightly tachy at times.  Wt down from pk post op 99.2 to 93.4   LOS: 9 days   Time spent with pt. : 15 minutes. INGOLD,LAURA R 12/07/2012, 9:31 AM    Patient seen and examined. Agree with  assessment and plan. Feels well. Ambulating without difficulty. Pt is a former pt of Dr. Elsie Lincoln and I had seen him in the office after his retirement before he went to the [place]. He wants [place] to resume his cardiac care. Will titrate Lopressor to 37.5 mg bid. Possible dc later today.   Lennette Bihari, MD, [facility] 12/07/2012 9:48 AM

## 2012-12-07 NOTE — Discharge Summary (Signed)
Physician Discharge Summary  Patient ID: Dylan Church MRN: [id number] DOB/AGE: 03/12/1951 62 y.o.  Admit date: 11/28/2012 Discharge date: 12/07/2012  Admission Diagnoses:  Patient Active Problem List  Diagnosis  . DIABETES MELLITUS  . HYPERLIPIDEMIA  . MIGRAINE HEADACHE  . HYPERTENSION  . HEMORRHOIDS-EXTERNAL  . EMPHYSEMA  . GERD  . RECTAL BLEEDING  . FATTY LIVER DISEASE  . DEGENERATIVE JOINT DISEASE  . BACK PAIN, CHRONIC- followed at pain clinic, he has an implantable MSO4 pump  . DYSPNEA ON EXERTION  . EPIGASTRIC PAIN  . COLONIC POLYPS, HX OF  . Unstable angina  . CAD h/o PCI,  progression 11/29/12- CABG - X3 12/03/12  . Tobacco abuse  . Obesity, unspecified  . Agent orange exposure- on disabilty from. [place] Vet '69-70  . Diastolic dysfunction grade 1 with an EF 60-65% 2D 11/30/12   Discharge Diagnoses:   Patient Active Problem List  Diagnosis  . DIABETES MELLITUS  . HYPERLIPIDEMIA  . MIGRAINE HEADACHE  . HYPERTENSION  . HEMORRHOIDS-EXTERNAL  . EMPHYSEMA  . GERD  . RECTAL BLEEDING  . FATTY LIVER DISEASE  . DEGENERATIVE JOINT DISEASE  . BACK PAIN, CHRONIC- followed at pain clinic, he has an implantable MSO4 pump  . DYSPNEA ON EXERTION  . EPIGASTRIC PAIN  . COLONIC POLYPS, HX OF  . Unstable angina  . CAD h/o PCI,  progression 11/29/12- CABG - X3 12/03/12  . Tobacco abuse  . Obesity, unspecified  . Agent orange exposure- on disabilty from. [place] Vet '69-70  . Diastolic dysfunction grade 1 with an EF 60-65% 2D 11/30/12  . S/P CABG x 3   Discharged Condition: good  History of Present Illness:   [name] is a 62 yo white male patient of the [place] with known history of CAD, COPD, Uncontrolled DM.  He is S/P PCI with drug eluting stent placement to the Left Circumflex and RCA in 2001.  The patient presented to an OSH with a complaint of chest pain that developed while dropping his grandson off at school.  He described the pain as being retrosternal and increased  with exertion, but alleviated with rest.  These types of episodes happened 3-4 times before patient presented to the ED.  EKG was obtained and did not show evidence of acute MI.  His cardiac enzymes were also negative.  He was noted to elevated blood glucose at presentation requiring acute treatment with insulin.  It was felt he would warrant further workup and he was transferred to [facility] for further evaluation.   Hospital Course:   He was admitted by the medicine service and Cardiology consult was obtained.  He was evaluated by Dr. Herbie Baltimore at which point the patient admitted to similar anginal episodes in July of last year.  Stress test was performed at the [facility] which showed reversible ischemia, but no Cardiology follow up was obtained. It was felt due to the positive stress test and recurrent angina symptoms the patient would benefit from cardiac catheterization.  This was performed on 11/29/2012 which showed a preserved EF and severe multivessel CAD.  It was felt the patient would benefit from Coronary Bypass procedure.  TCTS was consulted and the patient was evaluated by Dr. Donata Clay who felt the patient would be a candidate for coronary bypass.  The risks and benefits of the procedure were explained to the patient and he was agreeable to proceed.  The patient was taken to the operating room on 12/03/2012.  He underwent CABG  x 3 utilizing LIMA to LAD, SVG to OM1 and SVG to PLVB.  He also underwent combination Endoscopic Saphenous/Open Vein harvest of his left leg.  The patient tolerated the procedure well and was taken to the SICU in stable condition.  The patient was extubated the evening of surgery.  During his stay in the ICU he was weaned off all cardiac drips.  His chest tubes and arterial lines were removed.  He also had a JP drain removed from his Left leg.  He had issues with hyperglycemia requiring use of insulin drip. Once his blood sugars were controlled he was transitioned to a regular  insulin regimen.  Once the patient was medically stable he was transferred to the step down unit in stable condition.  The patient is doing well.  His pacing wires have been removed and he is maintaining NSR, but does have some Tachycardia.  His Lopressor has been increased.  His blood sugars have been under good control with use of insulin and patient educated on importance of adhering to diabetic diet.  He is ambulating without difficulty.  He is medically stable at this time and we will discharge the patient today.  He will need to follow up with Dr. Donata Clay on 01/02/2013 with a CXR prior to his appointment.  He will also follow up with [facility].        Consults: cardiology  Significant Diagnostic Studies: angiography:   Left Ventriculography:  EF: 55-60 %  Wall Motion: Normal Coronary Anatomy:  Left Main: Very short large-caliber vessel, bifurcates normally into the LAD and Circumflex1. Angiographically normal. LAD: Large-caliber vessel with a focal 90-90% proximal stenosis just after small first diagonal branch. This is followed by a significant septal trunk and a small D2. The remainder the vessel is a normal caliber vessel tapering down to and around the apex. Beyond the proximal lesion there is minimal irregularities, and good target vessel.  Several small diagonal vessels were noted. D2 is in no significant vessel as after the proximal lesion. It is a smaller moderate caliber vessel with ostial 40% stenosis..  Left Circumflex: Large caliber, likely codominant vessel that gives rise to a significant posterior lateral system as well as appears to be a co-PDA. There is a ostial 30% stenosis followed by a very small marginal branch. There is a proximal 80% stenosis that is somewhat hazy just prior to the first bifurcation into a lateral OM1 and the AV groove circumflex.  AV groove circumflex - there is a proximally placed stent beyond the bifurcation with diffuse in-stent restenosis of  up to 95% that appears irregular in nature. Beyond the stent the vessel trifurcates into 2 posterolateral branches and a branch that courses almost as a left posterior descending artery. Beyond the stent there is minimal luminal irregularities. OM1: Moderate caliber vessel that reaches almost to the inferoapex; minimal luminal irregularities  RCA: moderate to large caliber codominant vessel that gives rise to a very small, diminutive posterior descending artery. There is a proximal stent is widely patent, and otherwise minimal luminal irregularities.  Treatments: surgery:   1. Coronary artery bypass grafting x3 (left internal mammary artery to  LAD, saphenous vein graft to OM1, saphenous vein graft to  circumflex posterolateral.)  2. Endoscopic harvest (left leg greater saphenous vein.)  Disposition: Home  Discharge Medications:    Medication List    STOP taking these medications       isosorbide mononitrate 60 MG 24 hr tablet  Commonly known as:  IMDUR     nitroGLYCERIN 0.4 MG SL tablet  Commonly known as:  NITROSTAT      TAKE these medications       aspirin EC 325 MG tablet  Take 325 mg by mouth every morning.     atorvastatin 40 MG tablet  Commonly known as:  LIPITOR  Take 20 mg by mouth at bedtime.     budesonide-formoterol 160-4.5 MCG/ACT inhaler  Commonly known as:  SYMBICORT  Inhale 2 puffs into the lungs 2 (two) times daily.     cyclobenzaprine 10 MG tablet  Commonly known as:  FLEXERIL  Take 10 mg by mouth 3 (three) times daily as needed for muscle spasms. For mucle spasms     digoxin 0.25 MG tablet  Commonly known as:  LANOXIN  Take 1 tablet (0.25 mg total) by mouth daily.     DSS 100 MG Caps  Take 200 mg by mouth 2 (two) times daily as needed for constipation.     furosemide 40 MG tablet  Commonly known as:  LASIX  Take 1 tablet (40 mg total) by mouth daily. For 5 Days     gabapentin 800 MG tablet  Commonly known as:  NEURONTIN  Take 800 mg by mouth  3 (three) times daily.     insulin aspart 100 UNIT/ML injection  Commonly known as:  novoLOG  Inject 15 Units into the skin 3 (three) times daily before meals.     insulin detemir 100 UNIT/ML injection  Commonly known as:  LEVEMIR  Inject 45-60 Units into the skin 2 (two) times daily. Takes 60 units in the am & 45 units in the pm     levalbuterol 45 MCG/ACT inhaler  Commonly known as:  XOPENEX HFA  Inhale 2 puffs into the lungs every 6 (six) hours.     metoprolol tartrate 25 MG tablet  Commonly known as:  LOPRESSOR  Take 1.5 tablets (37.5 mg total) by mouth 2 (two) times daily.     oxyCODONE 5 MG immediate release tablet  Commonly known as:  Oxy IR/ROXICODONE  Take 1-2 tablets (5-10 mg total) by mouth every 4 (four) hours as needed.     potassium chloride 10 MEQ tablet  Commonly known as:  K-DUR  Take 1 tablet (10 mEq total) by mouth daily. For 5 Days     sertraline 100 MG tablet  Commonly known as:  ZOLOFT  Take 50 mg by mouth daily.       The patient has been discharged on:   1.Beta Blocker:  Yes [ x  ]                              No   [   ]                              If No, reason:  2.Ace Inhibitor/ARB: Yes [   ]                                     No  [  x  ]                                     If No,  reason: Preserved EF, Labile Blood pressure  3.Statin:   Yes [ x  ]                  No  [   ]                  If No, reason:  4.Ecasa:  Yes  [ x  ]                  No   [   ]                  If No, reason:     Discharge Orders   Future Appointments Provider Department Dept Phone   01/02/2013 2:00 PM Kerin Perna, MD [facility] [phone]   Future Orders Complete By Expires     Amb Referral to Cardiac Rehabilitation  As directed           Follow-up Information   Follow up with [facility], NP On 12/18/2012. (at 0900 AM, Dr. Landry Dyke NP)    Contact information:   [address] [place] [address] [phone]       Follow up with [facility] Dinah Beers, MD.   Contact information:   [address] [place] [address] [phone]       Follow up with [facility].   Contact information:   [place]       Signed: Drelyn Pistilli 12/07/2012, 10:42 AM

## 2012-12-07 NOTE — Progress Notes (Addendum)
Discharge Note 1350pm Patient given discharge instructions and prescriptions, all questions answered, teach back method used. IV was dcd and will discharge home as ordered. Anuradha Chabot, Randall An RN

## 2012-12-07 NOTE — Progress Notes (Signed)
[id number] Cardiac Rehab Completed discharge education with pt. He voices understanding. Pt agrees to Outpt. CRP in [place], will send referral. Discussed smoking cessation with pt. Gave him tips for quitting and coaching contact number. He states that he has quit. Melina Copa RN

## 2012-12-10 ENCOUNTER — Inpatient Hospital Stay (HOSPITAL_COMMUNITY)
Admission: AD | Admit: 2012-12-10 | Discharge: 2012-12-15 | DRG: 917 | Disposition: A | Discharge status: Home / Self Care | Payer: Non-veteran care | Source: Other Acute Inpatient Hospital | Attending: Internal Medicine | Admitting: Internal Medicine

## 2012-12-10 DIAGNOSIS — I252 Old myocardial infarction: Secondary | ICD-10-CM

## 2012-12-10 DIAGNOSIS — Z720 Tobacco use: Secondary | ICD-10-CM

## 2012-12-10 DIAGNOSIS — E119 Type 2 diabetes mellitus without complications: Secondary | ICD-10-CM | POA: Diagnosis present

## 2012-12-10 DIAGNOSIS — M549 Dorsalgia, unspecified: Secondary | ICD-10-CM | POA: Diagnosis present

## 2012-12-10 DIAGNOSIS — T424X4A Poisoning by benzodiazepines, undetermined, initial encounter: Secondary | ICD-10-CM | POA: Diagnosis present

## 2012-12-10 DIAGNOSIS — Z9861 Coronary angioplasty status: Secondary | ICD-10-CM

## 2012-12-10 DIAGNOSIS — T4394XA Poisoning by unspecified psychotropic drug, undetermined, initial encounter: Secondary | ICD-10-CM | POA: Diagnosis present

## 2012-12-10 DIAGNOSIS — Z9089 Acquired absence of other organs: Secondary | ICD-10-CM

## 2012-12-10 DIAGNOSIS — T43011A Poisoning by tricyclic antidepressants, accidental (unintentional), initial encounter: Secondary | ICD-10-CM | POA: Diagnosis present

## 2012-12-10 DIAGNOSIS — T8140XA Infection following a procedure, unspecified, initial encounter: Secondary | ICD-10-CM | POA: Diagnosis not present

## 2012-12-10 DIAGNOSIS — G894 Chronic pain syndrome: Secondary | ICD-10-CM | POA: Diagnosis present

## 2012-12-10 DIAGNOSIS — T400X1A Poisoning by opium, accidental (unintentional), initial encounter: Principal | ICD-10-CM | POA: Diagnosis present

## 2012-12-10 DIAGNOSIS — Z8249 Family history of ischemic heart disease and other diseases of the circulatory system: Secondary | ICD-10-CM

## 2012-12-10 DIAGNOSIS — R4182 Altered mental status, unspecified: Secondary | ICD-10-CM | POA: Diagnosis present

## 2012-12-10 DIAGNOSIS — T43591A Poisoning by other antipsychotics and neuroleptics, accidental (unintentional), initial encounter: Secondary | ICD-10-CM | POA: Diagnosis present

## 2012-12-10 DIAGNOSIS — Z951 Presence of aortocoronary bypass graft: Secondary | ICD-10-CM

## 2012-12-10 DIAGNOSIS — E669 Obesity, unspecified: Secondary | ICD-10-CM

## 2012-12-10 DIAGNOSIS — D649 Anemia, unspecified: Secondary | ICD-10-CM | POA: Diagnosis present

## 2012-12-10 DIAGNOSIS — I10 Essential (primary) hypertension: Secondary | ICD-10-CM | POA: Diagnosis present

## 2012-12-10 DIAGNOSIS — Z794 Long term (current) use of insulin: Secondary | ICD-10-CM

## 2012-12-10 DIAGNOSIS — R0609 Other forms of dyspnea: Secondary | ICD-10-CM

## 2012-12-10 DIAGNOSIS — J438 Other emphysema: Secondary | ICD-10-CM

## 2012-12-10 DIAGNOSIS — Y838 Other surgical procedures as the cause of abnormal reaction of the patient, or of later complication, without mention of misadventure at the time of the procedure: Secondary | ICD-10-CM | POA: Diagnosis not present

## 2012-12-10 DIAGNOSIS — R0989 Other specified symptoms and signs involving the circulatory and respiratory systems: Secondary | ICD-10-CM

## 2012-12-10 DIAGNOSIS — I2510 Atherosclerotic heart disease of native coronary artery without angina pectoris: Secondary | ICD-10-CM | POA: Diagnosis present

## 2012-12-10 DIAGNOSIS — F17200 Nicotine dependence, unspecified, uncomplicated: Secondary | ICD-10-CM | POA: Diagnosis present

## 2012-12-10 DIAGNOSIS — Z7982 Long term (current) use of aspirin: Secondary | ICD-10-CM

## 2012-12-10 DIAGNOSIS — I2699 Other pulmonary embolism without acute cor pulmonale: Secondary | ICD-10-CM

## 2012-12-10 DIAGNOSIS — L02219 Cutaneous abscess of trunk, unspecified: Secondary | ICD-10-CM | POA: Diagnosis not present

## 2012-12-10 DIAGNOSIS — G9340 Encephalopathy, unspecified: Secondary | ICD-10-CM

## 2012-12-10 DIAGNOSIS — Z79899 Other long term (current) drug therapy: Secondary | ICD-10-CM

## 2012-12-10 DIAGNOSIS — M6282 Rhabdomyolysis: Secondary | ICD-10-CM | POA: Diagnosis present

## 2012-12-10 DIAGNOSIS — E785 Hyperlipidemia, unspecified: Secondary | ICD-10-CM | POA: Diagnosis present

## 2012-12-10 MED ORDER — HEPARIN (PORCINE) IN NACL 100-0.45 UNIT/ML-% IJ SOLN
INTRAMUSCULAR | Status: AC
  Administered 2012-12-10: 2000 [IU] via INTRAVENOUS
  Filled 2012-12-10: qty 250

## 2012-12-11 ENCOUNTER — Encounter (HOSPITAL_COMMUNITY): Payer: Self-pay | Admitting: *Deleted

## 2012-12-11 DIAGNOSIS — I219 Acute myocardial infarction, unspecified: Secondary | ICD-10-CM

## 2012-12-11 DIAGNOSIS — M549 Dorsalgia, unspecified: Secondary | ICD-10-CM

## 2012-12-11 DIAGNOSIS — I2699 Other pulmonary embolism without acute cor pulmonale: Secondary | ICD-10-CM

## 2012-12-11 DIAGNOSIS — G9340 Encephalopathy, unspecified: Secondary | ICD-10-CM

## 2012-12-11 DIAGNOSIS — F17200 Nicotine dependence, unspecified, uncomplicated: Secondary | ICD-10-CM

## 2012-12-11 DIAGNOSIS — R0609 Other forms of dyspnea: Secondary | ICD-10-CM

## 2012-12-11 HISTORY — DX: Acute myocardial infarction, unspecified: I21.9

## 2012-12-11 LAB — URINE MICROSCOPIC-ADD ON

## 2012-12-11 LAB — CBC WITH DIFFERENTIAL/PLATELET
Eosinophils Absolute: 0.1 10*3/uL (ref 0.0–0.7)
Hemoglobin: 9.3 g/dL — ABNORMAL LOW (ref 13.0–17.0)
Lymphocytes Relative: 20 % (ref 12–46)
Lymphs Abs: 1.6 10*3/uL (ref 0.7–4.0)
MCH: 32 pg (ref 26.0–34.0)
Neutro Abs: 5.7 10*3/uL (ref 1.7–7.7)
Neutrophils Relative %: 69 % (ref 43–77)
Platelets: 228 10*3/uL (ref 150–400)
RBC: 2.91 MIL/uL — ABNORMAL LOW (ref 4.22–5.81)
WBC: 8.4 10*3/uL (ref 4.0–10.5)

## 2012-12-11 LAB — URINALYSIS, ROUTINE W REFLEX MICROSCOPIC
Ketones, ur: 15 mg/dL — AB
Leukocytes, UA: NEGATIVE
Nitrite: NEGATIVE
Protein, ur: NEGATIVE mg/dL
pH: 6 (ref 5.0–8.0)

## 2012-12-11 LAB — GLUCOSE, CAPILLARY: Glucose-Capillary: 136 mg/dL — ABNORMAL HIGH (ref 70–99)

## 2012-12-11 LAB — POCT I-STAT 3, ART BLOOD GAS (G3+)
Acid-Base Excess: 3 mmol/L — ABNORMAL HIGH (ref 0.0–2.0)
O2 Saturation: 91 %
Patient temperature: 98.2

## 2012-12-11 LAB — TROPONIN I
Troponin I: <0.3 ng/mL (ref <0.30)
Troponin I: <0.3 ng/mL (ref <0.30)

## 2012-12-11 LAB — HEPARIN LEVEL (UNFRACTIONATED): Heparin Unfractionated: 0.36 IU/mL (ref 0.30–0.70)

## 2012-12-11 LAB — COMPREHENSIVE METABOLIC PANEL
ALT: 77 U/L — ABNORMAL HIGH (ref 0–53)
Alkaline Phosphatase: 108 U/L (ref 39–117)
Chloride: 103 mEq/L (ref 96–112)
GFR calc Af Amer: >90 mL/min (ref >90)
Glucose, Bld: 163 mg/dL — ABNORMAL HIGH (ref 70–99)
Potassium: 3.6 mEq/L (ref 3.5–5.1)
Sodium: 138 mEq/L (ref 135–145)
Total Bilirubin: 0.4 mg/dL (ref 0.3–1.2)
Total Protein: 6.2 g/dL (ref 6.0–8.3)

## 2012-12-11 LAB — PHOSPHORUS: Phosphorus: 4 mg/dL (ref 2.3–4.6)

## 2012-12-11 MED ORDER — GABAPENTIN 800 MG PO TABS
800.0000 mg | ORAL_TABLET | Freq: Three times a day (TID) | ORAL | Status: DC
Start: 2012-12-11 — End: 2012-12-11
  Filled 2012-12-11 (×3): qty 1

## 2012-12-11 MED ORDER — POTASSIUM CHLORIDE CRYS ER 20 MEQ PO TBCR
40.0000 meq | EXTENDED_RELEASE_TABLET | Freq: Once | ORAL | Status: AC
  Administered 2012-12-11: 40 meq via ORAL
  Filled 2012-12-11: qty 2

## 2012-12-11 MED ORDER — FUROSEMIDE 40 MG PO TABS
40.0000 mg | ORAL_TABLET | Freq: Every day | ORAL | Status: DC
  Administered 2012-12-11 – 2012-12-15 (×5): 40 mg via ORAL
  Filled 2012-12-11 (×5): qty 1

## 2012-12-11 MED ORDER — SERTRALINE HCL 50 MG PO TABS
50.0000 mg | ORAL_TABLET | Freq: Every day | ORAL | Status: DC
  Administered 2012-12-11 – 2012-12-15 (×5): 50 mg via ORAL
  Filled 2012-12-11 (×5): qty 1

## 2012-12-11 MED ORDER — ASPIRIN EC 81 MG PO TBEC
81.0000 mg | DELAYED_RELEASE_TABLET | Freq: Every day | ORAL | Status: DC
  Administered 2012-12-11 – 2012-12-15 (×5): 81 mg via ORAL
  Filled 2012-12-11 (×5): qty 1

## 2012-12-11 MED ORDER — METOPROLOL TARTRATE 25 MG PO TABS
37.5000 mg | ORAL_TABLET | Freq: Two times a day (BID) | ORAL | Status: DC
  Administered 2012-12-11 – 2012-12-15 (×6): 37.5 mg via ORAL
  Filled 2012-12-11 (×10): qty 1

## 2012-12-11 MED ORDER — SODIUM CHLORIDE 0.9 % IV SOLN
INTRAVENOUS | Status: DC
  Administered 2012-12-11 (×2): via INTRAVENOUS

## 2012-12-11 MED ORDER — GABAPENTIN 400 MG PO CAPS
800.0000 mg | ORAL_CAPSULE | Freq: Three times a day (TID) | ORAL | Status: DC
  Administered 2012-12-11 – 2012-12-15 (×13): 800 mg via ORAL
  Filled 2012-12-11 (×15): qty 2

## 2012-12-11 MED ORDER — CYCLOBENZAPRINE HCL 10 MG PO TABS: 10.0000 mg | ORAL_TABLET | Freq: Three times a day (TID) | ORAL | Status: DC | PRN

## 2012-12-11 MED ORDER — INSULIN ASPART 100 UNIT/ML ~~LOC~~ SOLN
0.0000 [IU] | SUBCUTANEOUS | Status: DC
  Administered 2012-12-11: 2 [IU] via SUBCUTANEOUS
  Administered 2012-12-11: 3 [IU] via SUBCUTANEOUS
  Administered 2012-12-11 – 2012-12-12 (×4): 2 [IU] via SUBCUTANEOUS
  Administered 2012-12-13: 3 [IU] via SUBCUTANEOUS
  Administered 2012-12-13: 5 [IU] via SUBCUTANEOUS
  Administered 2012-12-13: 3 [IU] via SUBCUTANEOUS
  Administered 2012-12-13: 2 [IU] via SUBCUTANEOUS
  Administered 2012-12-14 (×6): 3 [IU] via SUBCUTANEOUS

## 2012-12-11 MED ORDER — ATORVASTATIN CALCIUM 20 MG PO TABS
20.0000 mg | ORAL_TABLET | Freq: Every day | ORAL | Status: DC
  Administered 2012-12-11 – 2012-12-14 (×4): 20 mg via ORAL
  Filled 2012-12-11 (×5): qty 1

## 2012-12-11 MED ORDER — BUDESONIDE-FORMOTEROL FUMARATE 160-4.5 MCG/ACT IN AERO
2.0000 | INHALATION_SPRAY | Freq: Two times a day (BID) | RESPIRATORY_TRACT | Status: DC
  Administered 2012-12-11 – 2012-12-15 (×9): 2 via RESPIRATORY_TRACT
  Filled 2012-12-11: qty 6

## 2012-12-11 MED ORDER — DIGOXIN 250 MCG PO TABS
0.2500 mg | ORAL_TABLET | Freq: Every day | ORAL | Status: DC
  Administered 2012-12-11 – 2012-12-15 (×5): 0.25 mg via ORAL
  Filled 2012-12-11 (×5): qty 1

## 2012-12-11 MED ORDER — LEVALBUTEROL TARTRATE 45 MCG/ACT IN AERO
2.0000 | INHALATION_SPRAY | Freq: Four times a day (QID) | RESPIRATORY_TRACT | Status: DC
  Administered 2012-12-11 – 2012-12-15 (×15): 2 via RESPIRATORY_TRACT
  Filled 2012-12-11: qty 15

## 2012-12-11 MED ORDER — DOCUSATE SODIUM 100 MG PO CAPS
200.0000 mg | ORAL_CAPSULE | Freq: Two times a day (BID) | ORAL | Status: DC | PRN
  Filled 2012-12-11: qty 2

## 2012-12-11 MED ORDER — HEPARIN (PORCINE) IN NACL 100-0.45 UNIT/ML-% IJ SOLN
2050.0000 [IU]/h | INTRAMUSCULAR | Status: DC
  Administered 2012-12-11 – 2012-12-12 (×2): 2000 [IU]/h via INTRAVENOUS
  Administered 2012-12-13 – 2012-12-14 (×2): 2050 [IU]/h via INTRAVENOUS
  Filled 2012-12-11 (×11): qty 250

## 2012-12-11 NOTE — Progress Notes (Signed)
ANTICOAGULATION CONSULT NOTE - Initial Consult  Pharmacy Consult for heparin Indication: pulmonary embolus  No Known Allergies  Patient Measurements: Height: 5\' 10"  (177.8 cm) Weight: 213 lb 6.5 oz (96.8 kg) IBW/kg (Calculated) : 73 Heparin Dosing Weight: 95kg  Vital Signs: Temp: 98.2 F (36.8 C) (04/14 2345) Temp src: Oral (04/14 2345) BP: 93/56 mmHg (04/15 0100) Pulse Rate: 85 (04/15 0100)  Labs:  Recent Labs  12/11/12 0109  HGB 9.3*  HCT 27.1*  PLT 228  APTT 105*  LABPROT 15.7*  INR 1.28  HEPARINUNFRC 0.36    Estimated Creatinine Clearance: 113.2 ml/min (by C-G formula based on Cr of 0.74).   Medical History: Past Medical History  Diagnosis Date  . Hypertension   . Myocardial infarction   . Diabetes mellitus without complication   . Hyperlipidemia   . CAD (coronary artery disease), native coronary artery; h/o BMS with redo PTCA (in 2001) of prox RCA; and PTCA followed by DES (Taxus 3.0 x 16 mm) to Circumflex (2004) 11/28/2012  . Tobacco abuse 11/29/2012  . BACK PAIN, CHRONIC 02/08/2008    Qualifier: Diagnosis of  By: Koleen Distance CMA (AAMA), Hulan Saas      Medications:  Prescriptions prior to admission  Medication Sig Dispense Refill  . aspirin EC 325 MG tablet Take 325 mg by mouth every morning.      Marland Kitchen atorvastatin (LIPITOR) 40 MG tablet Take 20 mg by mouth at bedtime.      . budesonide-formoterol (SYMBICORT) 160-4.5 MCG/ACT inhaler Inhale 2 puffs into the lungs 2 (two) times daily.  1 Inhaler  1  . cyclobenzaprine (FLEXERIL) 10 MG tablet Take 10 mg by mouth 3 (three) times daily as needed for muscle spasms. For mucle spasms      . digoxin (LANOXIN) 0.25 MG tablet Take 1 tablet (0.25 mg total) by mouth daily.  30 tablet  1  . docusate sodium 100 MG CAPS Take 200 mg by mouth 2 (two) times daily as needed for constipation.  10 capsule    . furosemide (LASIX) 40 MG tablet Take 1 tablet (40 mg total) by mouth daily. For 5 Days  5 tablet  0  . gabapentin (NEURONTIN) 800  MG tablet Take 800 mg by mouth 3 (three) times daily.      . insulin aspart (NOVOLOG) 100 UNIT/ML injection Inject 15 Units into the skin 3 (three) times daily before meals.      . insulin detemir (LEVEMIR) 100 UNIT/ML injection Inject 45-60 Units into the skin 2 (two) times daily. Takes 60 units in the am & 45 units in the pm      . levalbuterol (XOPENEX HFA) 45 MCG/ACT inhaler Inhale 2 puffs into the lungs every 6 (six) hours.  1 Inhaler  1  . metoprolol tartrate (LOPRESSOR) 25 MG tablet Take 1.5 tablets (37.5 mg total) by mouth 2 (two) times daily.  90 tablet  1  . oxyCODONE (OXY IR/ROXICODONE) 5 MG immediate release tablet Take 1-2 tablets (5-10 mg total) by mouth every 4 (four) hours as needed.  30 tablet  0  . potassium chloride (K-DUR) 10 MEQ tablet Take 1 tablet (10 mEq total) by mouth daily. For 5 Days  5 tablet  0  . sertraline (ZOLOFT) 100 MG tablet Take 50 mg by mouth daily.       Scheduled:  . [COMPLETED] heparin      . insulin aspart  0-15 Units Subcutaneous Q4H   Infusions:  . sodium chloride 20 mL/hr at 12/11/12 0004  Assessment: 62yo male had CABG one week ago, now presents [facility] management after found unresponsive by family, CT there revealed small bilateral PEs, to continue heparin started there.  Goal of Therapy:  Heparin level 0.3-0.7 units/ml Monitor platelets by anticoagulation protocol: Yes   Plan:  [facility] gave heparin 5000 units followed by gtt at 2000 units/hr; initial heparin level at goal.  Will continue gtt for now and confirm stable with additional level.  Monitor daily levels and CBC.  Vernard Gambles, PharmD, BCPS  12/11/2012,2:00 AM

## 2012-12-11 NOTE — Progress Notes (Signed)
  Echocardiogram 2D Echocardiogram has been performed.  Jaeceon Michelin, [facility] 12/11/2012, 10:25 AM

## 2012-12-11 NOTE — Progress Notes (Signed)
Bilateral lower extremity venous duplex:  No evidence of DVT, superficial thrombosis, or Baker's Cyst.   

## 2012-12-11 NOTE — Consult Note (Signed)
PULMONARY  / CRITICAL CARE MEDICINE  Name: Dylan Church MRN: [id number] DOB: 09-May-1951    ADMISSION DATE:  12/10/2012 CONSULTATION DATE:  12/10/12  REFERRING MD :  [facility] hospital transfer PRIMARY SERVICE: PCCM  CHIEF COMPLAINT:  AMS  BRIEF PATIENT DESCRIPTION: 70 with chronic pain syndrome on multiple psychoactive medications / morphine pump and CAD s/p recent CABG, found down unresponsive with pinpoint pupils.  Drug screen positive for benzodiazepines and TCA (not on medication list) in addition to opioids.Workup reveled bilateral lower lobe pulmonary emboli.  Transferred to [facility] per family request.  SIGNIFICANT EVENTS / STUDIES:  4/14  Brought to [facility] with altered mental status 4/14  Chest CT angio (outside) >>> small bilateral lower lobe PE 4/14  Head CT (outside) >>> nad  LINES / TUBES:  CULTURES:  ANTIBIOTICS:  The patient is encephalopathic and unable to provide history, which was obtained for available medical records.  HISTORY OF PRESENT ILLNESS:  23 with chronic pain syndrome on multiple psychoactive medications / morphine pump and CAD s/p recent CABG, found down unresponsive with pinpoint pupils.  Drug screen positive for benzodiazepines and TCA (not on medication list) in addition to opioids.  Workup reveled bilateral lower lobe pulmonary emboli.  Transferred to [facility] per family request.  PAST MEDICAL HISTORY :  Past Medical History  Diagnosis Date  . Hypertension   . Myocardial infarction   . Diabetes mellitus without complication   . Hyperlipidemia   . CAD (coronary artery disease), native coronary artery; h/o BMS with redo PTCA (in 2001) of prox RCA; and PTCA followed by DES (Taxus 3.0 x 16 mm) to Circumflex (2004) 11/28/2012  . Tobacco abuse 11/29/2012  . BACK PAIN, CHRONIC 02/08/2008    Qualifier: Diagnosis of  By: Koleen Distance CMA Duncan Dull), Hulan Saas     Past Surgical History  Procedure Laterality Date  . Cardiac catheterization    . Coronary angioplasty  2001     BMS to prox RCC in 1990s - [place] PTCA in 2001 (2.75 mm)   . Cholecystectomy    . Percutaneous coronary stent intervention (pci-s)  2004    Taxus DES 3.0 mm and 16 mm.  . Coronary artery bypass graft N/A 12/03/2012    Procedure: CORONARY ARTERY BYPASS GRAFTING (CABG);  Surgeon: Kerin Perna, MD;  Location: [facility] OR;  Service: Open Heart Surgery;  Laterality: N/A;  . Intraoperative transesophageal echocardiogram N/A 12/03/2012    Procedure: INTRAOPERATIVE TRANSESOPHAGEAL ECHOCARDIOGRAM;  Surgeon: Kerin Perna, MD;  Location: [facility] OR;  Service: Open Heart Surgery;  Laterality: N/A;   Prior to Admission medications   Medication Sig Start Date End Date Taking? Authorizing Provider  aspirin EC 325 MG tablet Take 325 mg by mouth every morning.    Historical Provider, MD  atorvastatin (LIPITOR) 40 MG tablet Take 20 mg by mouth at bedtime.    Historical Provider, MD  budesonide-formoterol (SYMBICORT) 160-4.5 MCG/ACT inhaler Inhale 2 puffs into the lungs 2 (two) times daily. 12/07/12   Erin Barrett, PA-C  cyclobenzaprine (FLEXERIL) 10 MG tablet Take 10 mg by mouth 3 (three) times daily as needed for muscle spasms. For mucle spasms    Historical Provider, MD  digoxin (LANOXIN) 0.25 MG tablet Take 1 tablet (0.25 mg total) by mouth daily. 12/07/12   Erin Barrett, PA-C  docusate sodium 100 MG CAPS Take 200 mg by mouth 2 (two) times daily as needed for constipation. 12/07/12   Erin Barrett, PA-C  furosemide (LASIX) 40 MG tablet Take 1 tablet (  40 mg total) by mouth daily. For 5 Days 12/07/12   Erin Barrett, PA-C  gabapentin (NEURONTIN) 800 MG tablet Take 800 mg by mouth 3 (three) times daily.    Historical Provider, MD  insulin aspart (NOVOLOG) 100 UNIT/ML injection Inject 15 Units into the skin 3 (three) times daily before meals.    Historical Provider, MD  insulin detemir (LEVEMIR) 100 UNIT/ML injection Inject 45-60 Units into the skin 2 (two) times daily. Takes 60 units in the am & 45 units in the pm     Historical Provider, MD  levalbuterol (XOPENEX HFA) 45 MCG/ACT inhaler Inhale 2 puffs into the lungs every 6 (six) hours. 12/07/12   Erin Barrett, PA-C  metoprolol tartrate (LOPRESSOR) 25 MG tablet Take 1.5 tablets (37.5 mg total) by mouth 2 (two) times daily. 12/07/12   Erin Barrett, PA-C  oxyCODONE (OXY IR/ROXICODONE) 5 MG immediate release tablet Take 1-2 tablets (5-10 mg total) by mouth every 4 (four) hours as needed. 12/07/12   Erin Barrett, PA-C  potassium chloride (K-DUR) 10 MEQ tablet Take 1 tablet (10 mEq total) by mouth daily. For 5 Days 12/07/12   Erin Barrett, PA-C  sertraline (ZOLOFT) 100 MG tablet Take 50 mg by mouth daily.    Historical Provider, MD   No Known Allergies  FAMILY HISTORY:  Family History  Problem Relation Age of Onset  . CAD Sister    SOCIAL HISTORY:  reports that he has been smoking.  He does not have any smokeless tobacco history on file. He reports that he does not drink alcohol. His drug history is not on file.  REVIEW OF SYSTEMS:  Unable to provide.  SUBJECTIVE: pt drowsy and low arousablity  VITAL SIGNS: Temp:  [98.2 F (36.8 C)] 98.2 F (36.8 C) (04/14 2345) Pulse Rate:  [88-90] 88 (04/15 0000) Resp:  [14-17] 14 (04/15 0000) BP: (91-113)/(39-50) 91/39 mmHg (04/15 0000) SpO2:  [95 %-98 %] 95 % (04/15 0000) Weight:  [213 lb 6.5 oz (96.8 kg)] 213 lb 6.5 oz (96.8 kg) (04/14 2345)  HEMODYNAMICS:   VENTILATOR SETTINGS:   INTAKE / OUTPUT: Intake/Output     04/14 0701 - 04/15 0700   Urine (mL/kg/hr) 480   Total Output 480   Net -480         PHYSICAL EXAMINATION: General:  Alert, larthargic  Neuro: Sleepy but arouses to stimulation, pinpoint pupils, protects airway HEENT:  No JVD Cardiovascular:  RRR, no murmurs Lungs:  CTAB, no crackles, no wheezes Abdomen:  RLQ pump subQ, soft, nt, nd Musculoskeletal:  intact Skin:  Right LE +1, no left LE edema  LABS:  Recent Labs Lab 12/04/12 0407  12/04/12 0449 12/04/12 1700  12/05/12 0415  12/06/12 0345 12/07/12 0520  HGB  --   < > 11.0* 11.6*  < > 10.5* 10.3* 10.2*  WBC  --   < > 12.5* 16.6*  --  13.7* 11.3* 11.0*  PLT  --   < > 117* 117*  --  97* 108* 147*  NA  --   --  136  --   < > 137 133* 135  K  --   --  4.5  --   < > 4.0 4.0 5.4*  CL  --   --  102  --   < > 100 98 99  CO2  --   < > 26  --   --  29 32 32  GLUCOSE  --   --  94  --   < >  115* 93 104*  BUN  --   --  6  --   < > 9 11 15   CREATININE  --   < > 0.57 0.61  < > 0.57 0.62 0.74  CALCIUM  --   < > 8.8  --   --  8.7 8.8 9.1  MG  --   --  2.2 2.1  --   --   --   --   AST  --   --   --   --   --   --  22  --   ALT  --   --   --   --   --   --  24  --   ALKPHOS  --   --   --   --   --   --  86  --   BILITOT  --   --   --   --   --   --  0.4  --   PROT  --   --   --   --   --   --  6.1  --   ALBUMIN  --   --   --   --   --   --  2.6*  --   PHART 7.425  --   --   --   --   --   --   --   PCO2ART 46.3*  --   --   --   --   --   --   --   PO2ART 93.0  --   --   --   --   --   --   --   < > = values in this interval not displayed.  Recent Labs Lab 12/06/12 1205 12/06/12 1613 12/06/12 2018 12/07/12 0635 12/07/12 1114  GLUCAP 163* 150* 167* 87 204*   CXR: 4/14 (outside) >>> Small bilateral effusions unchanged from baseline. No overt airspace disease.  ASSESSMENT / PLAN:  PULMONARY A: Acute pulmonary emboli, however hemodynamically stable without tachycardia and hypoxia. P:   Heparin per Pharmacy Bilateral LE venous Dopplers TTE to evaluate RV function  CARDIOVASCULAR A: CAD s/p CABG on 3/14. P:  Continue ASA, Lipitor, Digoxin, Metoprolol when able to take diet Notify TCTS / Cardiology in AM  RENAL A:  No active issues. Suspicion for rhabdomyolysis.  P:   Trend BMP Check CK, Myoglobin IVF@KVO  Hold Lasix  GASTROINTESTINAL A:  No active issues. P:   NPO for now  HEMATOLOGIC A:  No active issues P:  Trend CBC  INFECTIOUS A:  No active issues. P:   No intervention  required  ENDOCRINE A:  DM 2.  Hyperglycemia. P:   SSI  NEUROLOGIC A:  Acute encephalopathy in setting of multiple psychotropic meds / opioid intoxication. UA + opiods, tricyclics (not on Rx list), benzodiazepins (not on Rx list). Negative head CT. P:   Hold Gabapentin, Zoloft, OxyIR, and Flexeril Interrogate morphine pump in AM   TODAY'S SUMMARY: 61 with chronic pain syndrome on multiple psychoactive medications / morphine pump and CAD s/p recent CABG, found down unresponsive with pinpoint pupils.  Drug screen positive for benzodiazepines and TCA (not on medication list) in addition to opioids.Workup reveled bilateral lower lobe pulmonary emboli. Tonight >>> Heparin gtt, hold psychoactive Rx.  In AM interrogate morphine pump; make Cardiology / TCTS aware of admission.   Christen Bame, MD PGY-1 Pgr: [phone]  I have personally obtained a history, examined  the patient, evaluated laboratory and imaging results, formulated the assessment and plan and placed orders.  Lonia Farber, MD Pulmonary and Critical Care Medicine [facility] Pager: [phone]  12/11/2012, 12:31 AM

## 2012-12-11 NOTE — Progress Notes (Signed)
PULMONARY  / CRITICAL CARE MEDICINE  Name: Dylan Church MRN: [id number] DOB: 22-Nov-1950    ADMISSION DATE:  12/10/2012 CONSULTATION DATE:  12/10/12  REFERRING MD :  [facility] hospital transfer PRIMARY SERVICE: PCCM  CHIEF COMPLAINT:  AMS  BRIEF PATIENT DESCRIPTION: 19 with chronic pain syndrome on multiple psychoactive medications / morphine pump and CAD s/p recent CABG, found down unresponsive with pinpoint pupils.  Drug screen positive for benzodiazepines and TCA (not on medication list) in addition to opioids.Workup reveled bilateral lower lobe pulmonary emboli.  Transferred to [facility] per family request.  SIGNIFICANT EVENTS / STUDIES:  4/14  Brought to [facility] with altered mental status 4/14  Chest CT angio (outside) >>> small bilateral lower lobe PE 4/14  Head CT (outside) >>> nad  LINES / TUBES: PIV  CULTURES: None  ANTIBIOTICS: None  SUBJECTIVE: Arousable and interactive, normal mental status.  VITAL SIGNS: Temp:  [98 F (36.7 C)-98.3 F (36.8 C)] 98 F (36.7 C) (04/15 0800) Pulse Rate:  [80-90] 88 (04/15 0700) Resp:  [10-17] 13 (04/15 0700) BP: (87-114)/(39-56) 106/55 mmHg (04/15 0700) SpO2:  [95 %-100 %] 97 % (04/15 0700) Weight:  [96.3 kg (212 lb 4.9 oz)-96.8 kg (213 lb 6.5 oz)] 96.3 kg (212 lb 4.9 oz) (04/15 0600)  HEMODYNAMICS:   VENTILATOR SETTINGS:   INTAKE / OUTPUT: Intake/Output     04/14 0701 - 04/15 0700 04/15 0701 - 04/16 0700   I.V. (mL/kg) 279 (2.9) 40 (0.4)   Total Intake(mL/kg) 279 (2.9) 40 (0.4)   Urine (mL/kg/hr) 480    Total Output 480     Net -201 +40          PHYSICAL EXAMINATION: General:  Alert, larthargic  Neuro: Sleepy but arouses to stimulation, pinpoint pupils, protects airway HEENT:  No JVD Cardiovascular:  RRR, no murmurs Lungs:  CTAB, no crackles, no wheezes Abdomen:  RLQ pump subQ, soft, nt, nd Musculoskeletal:  intact Skin:  Right LE +1, no left LE edema  LABS:  Recent Labs Lab 12/04/12 1700   12/06/12 0345 12/07/12 0520 12/11/12 0109 12/11/12 0227 12/11/12 0715  HGB 11.6*  < > 10.3* 10.2* 9.3*  --   --   WBC 16.6*  < > 11.3* 11.0* 8.4  --   --   PLT 117*  < > 108* 147* 228  --   --   NA  --   < > 133* 135 138  --   --   K  --   < > 4.0 5.4* 3.6  --   --   CL  --   < > 98 99 103  --   --   CO2  --   < > 32 32 27  --   --   GLUCOSE  --   < > 93 104* 163*  --   --   BUN  --   < > 11 15 11   --   --   CREATININE 0.61  < > 0.62 0.74 0.68  --   --   CALCIUM  --   < > 8.8 9.1 8.7  --   --   MG 2.1  --   --   --  2.1  --   --   PHOS  --   --   --   --  4.0  --   --   AST  --   --  22  --  296*  --   --  ALT  --   --  24  --  77*  --   --   ALKPHOS  --   --  86  --  108  --   --   BILITOT  --   --  0.4  --  0.4  --   --   PROT  --   --  6.1  --  6.2  --   --   ALBUMIN  --   --  2.6*  --  2.4*  --   --   APTT  --   --   --   --  105*  --   --   INR  --   --   --   --  1.28  --   --   TROPONINI  --   --   --   --  <0.30  --  <0.30  PHART  --   --   --   --   --  7.407  --   PCO2ART  --   --   --   --   --  44.0  --   PO2ART  --   --   --   --   --  59.0*  --   < > = values in this interval not displayed.  Recent Labs Lab 12/07/12 0635 12/07/12 1114 12/11/12 0059 12/11/12 0428 12/11/12 0756  GLUCAP 87 204* 151* 107* 119*   CXR: 4/14 (outside) >>> Small bilateral effusions unchanged from baseline. No overt airspace disease.  ASSESSMENT / PLAN:  PULMONARY A: Acute pulmonary emboli, however hemodynamically stable without tachycardia and hypoxia. P:   - Heparin per Pharmacy, will start coumadin in AM. - Bilateral LE venous Dopplers pending. - TTE to evaluate RV function pending.  CARDIOVASCULAR A: CAD s/p CABG on 3/14. P:  - Restart ASA, Lipitor, Digoxin, Metoprolol.  RENAL A:  No active issues. Suspicion for rhabdomyolysis.  P:   - Trend BMP - Replace K. - KVO IVF. - Restart Lasix PO.  GASTROINTESTINAL A:  No active issues. P:   Start  diet.  HEMATOLOGIC A:  No active issues P:  - Trend CBC.  INFECTIOUS A:  No active issues. P:   - No intervention required.  ENDOCRINE A:  DM 2.  Hyperglycemia. P:   - SSI and hold home lantus and novolog.  NEUROLOGIC A:  Acute encephalopathy in setting of multiple psychotropic meds / opioid intoxication. UA + opiods, tricyclics (not on Rx list), benzodiazepins (not on Rx list). Negative head CT. P:   - Restart Gabapentin, Zoloft and Flexeril but hold oxycodone. - Continue morphine pump.  TODAY'S SUMMARY: 10 with chronic pain syndrome on multiple psychoactive medications / morphine pump and CAD s/p recent CABG, found down unresponsive with pinpoint pupils.  Drug screen positive for benzodiazepines and TCA (not on medication list) in addition to opioids.Workup reveled bilateral lower lobe pulmonary emboli.  Will hold in the ICU overnight and likely transfer out to SDU in AM.   Alyson Reedy, M.D. [facility] Pulmonary/Critical Care Medicine. Pager: [phone]. After hours pager: [phone].

## 2012-12-11 NOTE — Progress Notes (Signed)
ANTICOAGULATION CONSULT NOTE - Follow Up Consult  Pharmacy Consult for Heparin Indication: Pulmonary Embolus  No Known Allergies  Patient Measurements: Height: 5\' 10"  (177.8 cm) Weight: 212 lb 4.9 oz (96.3 kg) IBW/kg (Calculated) : 73 Heparin Dosing Weight: 95kg   Recent Labs  12/11/12 0109 12/11/12 0715  HGB 9.3*  --   HCT 27.1*  --   PLT 228  --   APTT 105*  --   LABPROT 15.7*  --   INR 1.28  --   HEPARINUNFRC 0.36 0.45  CREATININE 0.68  --   CKTOTAL 16109*  --   TROPONINI <0.30  --     Estimated Creatinine Clearance: 112.9 ml/min (by C-G formula based on Cr of 0.68).   Medications:  IV Heparin 2000 units/hr  Assessment: 62yo male s/p CABG on 12/03/12, presenting from [facility] after being found unresponsive by family. CT there revealed small bilateral lower lobe PEs, continuing on IV heparin which was started at [facility]. Heparin level remains therapeutic at 0.45. No issues with heparin line and no bleeding noted by RN or in chart.  Goal of Therapy:  Heparin level 0.3-0.7 units/ml Monitor platelets by anticoagulation protocol: Yes   Plan:  1) Continue IV heparin at current rate of 2000 units/hr 2) Daily heparin level and CBC while on heparin 3) Continue to monitor signs/symptoms of bleeding   Benjaman Pott, PharmD, BCPS 12/11/2012   8:10 AM

## 2012-12-12 DIAGNOSIS — E669 Obesity, unspecified: Secondary | ICD-10-CM

## 2012-12-12 LAB — BASIC METABOLIC PANEL
Calcium: 8.7 mg/dL (ref 8.4–10.5)
GFR calc non Af Amer: >90 mL/min (ref >90)
Glucose, Bld: 117 mg/dL — ABNORMAL HIGH (ref 70–99)
Sodium: 139 mEq/L (ref 135–145)

## 2012-12-12 LAB — CBC
MCH: 32.1 pg (ref 26.0–34.0)
MCHC: 33.3 g/dL (ref 30.0–36.0)
Platelets: 220 10*3/uL (ref 150–400)

## 2012-12-12 LAB — GLUCOSE, CAPILLARY
Glucose-Capillary: 132 mg/dL — ABNORMAL HIGH (ref 70–99)
Glucose-Capillary: 129 mg/dL — ABNORMAL HIGH (ref 70–99)
Glucose-Capillary: 118 mg/dL — ABNORMAL HIGH (ref 70–99)

## 2012-12-12 LAB — MYOGLOBIN, SERUM: Myoglobin: 4585 ng/mL — ABNORMAL HIGH (ref <111)

## 2012-12-12 LAB — MAGNESIUM: Magnesium: 2.2 mg/dL (ref 1.5–2.5)

## 2012-12-12 LAB — PHOSPHORUS: Phosphorus: 4 mg/dL (ref 2.3–4.6)

## 2012-12-12 MED ORDER — WARFARIN VIDEO
Freq: Once | Status: AC
  Administered 2012-12-13: 18:00:00

## 2012-12-12 MED ORDER — WARFARIN SODIUM 7.5 MG PO TABS
7.5000 mg | ORAL_TABLET | Freq: Once | ORAL | Status: AC
  Administered 2012-12-12: 7.5 mg via ORAL
  Filled 2012-12-12 (×3): qty 1

## 2012-12-12 MED ORDER — VANCOMYCIN HCL IN DEXTROSE 1-5 GM/200ML-% IV SOLN
1000.0000 mg | Freq: Two times a day (BID) | INTRAVENOUS | Status: DC
  Administered 2012-12-12 – 2012-12-14 (×6): 1000 mg via INTRAVENOUS
  Filled 2012-12-12 (×8): qty 200

## 2012-12-12 MED ORDER — WARFARIN - PHARMACIST DOSING INPATIENT
Freq: Every day | Status: DC
  Administered 2012-12-13: 18:00:00

## 2012-12-12 MED ORDER — COUMADIN BOOK
Freq: Once | Status: AC
  Filled 2012-12-12: qty 1

## 2012-12-12 MED ORDER — VANCOMYCIN HCL IN DEXTROSE 1-5 GM/200ML-% IV SOLN
1000.0000 mg | Freq: Two times a day (BID) | INTRAVENOUS | Status: DC
  Filled 2012-12-12 (×3): qty 200

## 2012-12-12 NOTE — Evaluation (Addendum)
Physical Therapy One-Time Evaluation/Discharge Patient Details Name: Dylan Church MRN: [id number] DOB: Jun 01, 1951 Today's Date: 12/12/2012 Time: 4098-1191 PT Time Calculation (min): 34 min  PT Assessment / Plan / Recommendation Clinical Impression  62 y.o. male admitted to [facility] for bil PE with recent h/o CABG on 12/03/12.  He presents today ambulating well, steady with RW.  He is at his normal baseline.  O2 sats 94% on RA with gait.  No DOE.  He has no further PT needs at this time, no PT f/u needs and no equipment needs at discharge.     PT Assessment  Patent does not need any further PT services    Follow Up Recommendations  No PT follow up    Does the patient have the potential to tolerate intense rehabilitation     NA  Barriers to Discharge  None      Equipment Recommendations  None recommended by PT    Recommendations for Other Services   none  Frequency   NA   Precautions / Restrictions Precautions Precautions: Fall;Sternal Precaution Comments: self reported history of balance issues, recent CABG (12/03/12) Restrictions Weight Bearing Restrictions: No   Pertinent Vitals/Pain O2 sats 94 % while walking on RA, no DOE.        Mobility  Transfers Transfers: Sit to Stand;Stand to Sit Sit to Stand: 6: Modified independent (Device/Increase time);With upper extremity assist;From bed Stand to Sit: 6: Modified independent (Device/Increase time);With upper extremity assist;With armrests;To bed;To chair/3-in-1 Details for Transfer Assistance: pt using legs more than arms to get to standing and sitting.   Ambulation/Gait Ambulation/Gait Assistance: 5: Supervision Ambulation Distance (Feet): 750 Feet Assistive device: Rolling walker Ambulation/Gait Assistance Details: pt walking and talking entire time.  O2 sats 94 % on RA.   Gait Pattern: Step-through pattern;Trunk flexed     Visit Information  Last PT Received On: 12/12/12 Assistance Needed: +1    Subjective Data  Subjective: Pt reports that he OD on flexeril.  He has an implated morphine pump for his back pain.   Patient Stated Goal: to go home tomorrow.     Prior Functioning  Home Living Lives With: Family Available Help at Discharge: Family;Available 24 hours/day Type of Home: House Home Access: Stairs to enter [employer] of Steps: 5 Entrance Stairs-Rails: Right Home Layout: One level Home Adaptive Equipment: Walker - rolling Additional Comments: pt reports the [place] doctor recently made him start using a RW for his balance.  Prior Function Level of Independence: Independent with assistive device(s) Able to Take Stairs?: Yes Driving: No Communication Communication: No difficulties    Cognition  Cognition Arousal/Alertness: Awake/alert Behavior During Therapy: WFL for tasks assessed/performed Overall Cognitive Status: History of cognitive impairments - at baseline (pt self reports memory deficits)    Extremity/Trunk Assessment Right Lower Extremity Assessment RLE ROM/Strength/Tone: Within functional levels RLE Sensation: Deficits;History of peripheral neuropathy RLE Sensation Deficits: R>L knee down  Left Lower Extremity Assessment LLE ROM/Strength/Tone: Within functional levels LLE Sensation: Deficits;History of peripheral neuropathy LLE Sensation Deficits: R> L knee down      End of Session PT - End of Session Activity Tolerance: Patient tolerated treatment well Patient left: in chair;with call bell/phone within reach Nurse Communication: Mobility status   Lurena Joiner B. Pedrohenrique [place], PT, DPT [phone]   12/12/2012, 4:13 PM

## 2012-12-12 NOTE — Progress Notes (Signed)
PULMONARY  / CRITICAL CARE MEDICINE  Name: Dylan Church MRN: [id number] DOB: [birth year]    ADMISSION DATE:  12/10/2012 CONSULTATION DATE:  12/10/12  REFERRING MD :  [facility] hospital transfer PRIMARY SERVICE: PCCM  CHIEF COMPLAINT:  AMS  BRIEF PATIENT DESCRIPTION: 68 with chronic pain syndrome on multiple psychoactive medications / morphine pump and CAD s/p recent CABG, found down unresponsive with pinpoint pupils.  Drug screen positive for benzodiazepines and TCA (not on medication list) in addition to opioids.Workup reveled bilateral lower lobe pulmonary emboli.  Transferred to [facility] per family request.  SIGNIFICANT EVENTS / STUDIES:  4/14  Brought to [facility] with altered mental status 4/14  Chest CT angio (outside) >>> small bilateral lower lobe PE 4/14  Head CT (outside) >>> nad  LINES / TUBES: PIV  CULTURES: None  ANTIBIOTICS: None  SUBJECTIVE: Arousable and interactive, normal mental status.  VITAL SIGNS: Temp:  [97.8 F (36.6 C)-98.4 F (36.9 C)] 97.8 F (36.6 C) (04/16 0800) Pulse Rate:  [79-95] 95 (04/16 0800) Resp:  [11-21] 21 (04/16 0800) BP: (85-129)/(41-102) 129/102 mmHg (04/16 0800) SpO2:  [94 %-100 %] 98 % (04/16 0800) Weight:  [96.3 kg (212 lb 4.9 oz)] 96.3 kg (212 lb 4.9 oz) (04/16 0500)  HEMODYNAMICS:   VENTILATOR SETTINGS:   INTAKE / OUTPUT: Intake/Output     04/15 0701 - 04/16 0700 04/16 0701 - 04/17 0700   P.O. 660    I.V. (mL/kg) 950 (9.9) 80 (0.8)   Total Intake(mL/kg) 1610 (16.7) 80 (0.8)   Urine (mL/kg/hr) 1050 (0.5)    Total Output 1050     Net +560 +80        Urine Occurrence  1 x     PHYSICAL EXAMINATION: General:  Alert, larthargic  Neuro: Sleepy but arouses to stimulation, pinpoint pupils, protects airway HEENT:  No JVD Cardiovascular:  RRR, no murmurs Lungs:  CTAB, no crackles, no wheezes Abdomen:  RLQ pump subQ, soft, nt, nd Musculoskeletal:  intact Skin:  Right LE +1, no left LE edema  LABS:  Recent  Labs Lab 12/06/12 0345 12/07/12 0520 12/11/12 0109 12/11/12 0227 12/11/12 0715 12/11/12 1235 12/12/12 0420  HGB 10.3* 10.2* 9.3*  --   --   --  9.4*  WBC 11.3* 11.0* 8.4  --   --   --  6.7  PLT 108* 147* 228  --   --   --  220  NA 133* 135 138  --   --   --  139  K 4.0 5.4* 3.6  --   --   --  3.9  CL 98 99 103  --   --   --  103  CO2 32 32 27  --   --   --  28  GLUCOSE 93 104* 163*  --   --   --  117*  BUN 11 15 11   --   --   --  8  CREATININE 0.62 0.74 0.68  --   --   --  0.67  CALCIUM 8.8 9.1 8.7  --   --   --  8.7  MG  --   --  2.1  --   --   --  2.2  PHOS  --   --  4.0  --   --   --  4.0  AST 22  --  296*  --   --   --   --   ALT 24  --  77*  --   --   --   --  ALKPHOS 86  --  108  --   --   --   --   BILITOT 0.4  --  0.4  --   --   --   --   PROT 6.1  --  6.2  --   --   --   --   ALBUMIN 2.6*  --  2.4*  --   --   --   --   APTT  --   --  105*  --   --   --   --   INR  --   --  1.28  --   --   --   --   TROPONINI  --   --  <0.30  --  <0.30 <0.30  --   PHART  --   --   --  7.407  --   --   --   PCO2ART  --   --   --  44.0  --   --   --   PO2ART  --   --   --  59.0*  --   --   --    Recent Labs Lab 12/11/12 1716 12/11/12 1954 12/11/12 2339 12/12/12 0346 12/12/12 0730  GLUCAP 131* 123* 108* 132* 111*   CXR: 4/14 (outside) >>> Small bilateral effusions unchanged from baseline. No overt airspace disease.  ASSESSMENT / PLAN:  PULMONARY A: Acute pulmonary emboli, however hemodynamically stable without tachycardia and hypoxia. P:   - Heparin per Pharmacy, will start coumadin 4/16 at night. - Bilateral LE venous Dopplers negative for DVT. - TTE to evaluate RV function as normal but no TR to evaluate PAP. - Titrate O2 down as tolerated.  CARDIOVASCULAR A: CAD s/p CABG on 3/14. P:  - Continue ASA, Lipitor, Digoxin, Metoprolol PO.  RENAL A:  No active issues. Suspicion for rhabdomyolysis.  P:   - Trend BMP - Replace K as needed. - KVO IVF. - Continue Lasix  PO daily.  GASTROINTESTINAL A:  No active issues. P:   - Continue diet  HEMATOLOGIC A:  No active issues P:  - Trend CBC.  INFECTIOUS A:  No active issues. P:   - No intervention required.  ENDOCRINE A:  DM 2.  Hyperglycemia. P:   - SSI and hold home lantus and novolog.  NEUROLOGIC A:  Acute encephalopathy in setting of multiple psychotropic meds / opioid intoxication. UA + opiods, tricyclics (not on Rx list), benzodiazepins (not on Rx list). Negative head CT. P:   - Continue Gabapentin, Zoloft and Flexeril but hold oxycodone. - Continue morphine pump.  TODAY'S SUMMARY: 74 with chronic pain syndrome on multiple psychoactive medications / morphine pump and CAD s/p recent CABG, found down unresponsive with pinpoint pupils.  Drug screen positive for benzodiazepines and TCA (not on medication list) in addition to opioids.Workup reveled bilateral lower lobe pulmonary emboli. Will transfer to tele and to [facility] service, will need to continue coumadin/heparin bridge until ready for discharge with outpatient f/u.  PCCM will sign off, please call back if needed.   Alyson Reedy, M.D. [facility] Pulmonary/Critical Care Medicine. Pager: [phone]. After hours pager: [phone].

## 2012-12-12 NOTE — Progress Notes (Signed)
ANTICOAGULATION CONSULT NOTE - Follow Up Consult  Pharmacy Consult for Heparin and Coumadin Indication: pulmonary embolus  No Known Allergies  Patient Measurements: Height: 5\' 10"  (177.8 cm) Weight: 212 lb 4.9 oz (96.3 kg) IBW/kg (Calculated) : 73 Heparin Dosing Weight: 92.8kg  Vital Signs: Temp: 97.8 F (36.6 C) (04/16 0800) Temp src: Oral (04/16 0800) BP: 89/55 mmHg (04/16 1100) Pulse Rate: 95 (04/16 0800)  Labs:  Recent Labs  12/11/12 0109 12/11/12 0715 12/11/12 1235 12/12/12 0420 12/12/12 1014  HGB 9.3*  --   --  9.4*  --   HCT 27.1*  --   --  28.2*  --   PLT 228  --   --  220  --   APTT 105*  --   --   --   --   LABPROT 15.7*  --   --   --   --   INR 1.28  --   --   --   --   HEPARINUNFRC 0.36 0.45  --   --  0.37  CREATININE 0.68  --   --  0.67  --   CKTOTAL 16109*  --   --   --   --   TROPONINI <0.30 <0.30 <0.30  --   --     Estimated Creatinine Clearance: 112.9 ml/min (by C-G formula based on Cr of 0.67).   Medications:  Heparin 2000 units/hr  Assessment: 61yom on heparin bridging now to Coumadin for bilateral PEs. Heparin level (0.37) remains therapeutic but trended down some - will increase rate slightly.  - Baseline INR: 1.28 - H/H and Plts stable - No significant bleeding reported - Warfarin points: 7 - AST/ALT elevated on admit - will recheck in AM  Goal of Therapy:  INR 2-3 Heparin level 0.3-0.7 units/ml Monitor platelets by anticoagulation protocol: Yes   Plan:  1. Increase heparin drip to 2050 units/hr (20.5 ml/hr) 2. Coumadin 7.5mg  po x 1 today 3. LFTs with AM labs tomorrow 4. Daily INR, CBC and heparin level 5. Coumadin educational book/video  Cleon Dew [phone] 12/12/2012,11:53 AM

## 2012-12-12 NOTE — Progress Notes (Signed)
  Subjective: Postop multivessel CABG with readmission do to oversedation-altered mental status CTA shows bibasilar small pulmonary emboli with negative DVT on duplex ultrasound Cellulitis around leg incision vein harvest site and lower sternal incision-IV vancomycin started Diabetic control is satisfactory Oral Coumadin being initiated while on IV heparin Continue IV antibiotics while patient hospitalized-we'll follow  Objective: Vital signs in last 24 hours: Temp:  [97.8 F (36.6 C)-98.1 F (36.7 C)] 98 F (36.7 C) (04/16 2036) Pulse Rate:  [79-95] 95 (04/16 2036) Cardiac Rhythm:  [-] Normal sinus rhythm (04/16 1547) Resp:  [11-21] 18 (04/16 2036) BP: (85-129)/(46-102) 108/60 mmHg (04/16 2036) SpO2:  [94 %-100 %] 95 % (04/16 2036) Weight:  [212 lb 4.9 oz (96.3 kg)] 212 lb 4.9 oz (96.3 kg) (04/16 0500)  Hemodynamic parameters for last 24 hours:   stable afebrile  Intake/Output from previous day: 04/15 0701 - 04/16 0700 In: 1610 [P.O.:660; I.V.:950] Out: 1050 [Urine:1050] Intake/Output this shift: Total I/O In: -  Out: 500 [Urine:500]  Sternum stable  Extremities warm Chest x-ray with minimal left effusion-atelectasis left base  Lab Results:  Recent Labs  12/11/12 0109 12/12/12 0420  WBC 8.4 6.7  HGB 9.3* 9.4*  HCT 27.1* 28.2*  PLT 228 220   BMET:  Recent Labs  12/11/12 0109 12/12/12 0420  NA 138 139  K 3.6 3.9  CL 103 103  CO2 27 28  GLUCOSE 163* 117*  BUN 11 8  CREATININE 0.68 0.67  CALCIUM 8.7 8.7    PT/INR:  Recent Labs  12/11/12 0109  LABPROT 15.7*  INR 1.28   ABG    Component Value Date/Time   PHART 7.407 12/11/2012 0227   HCO3 27.7* 12/11/2012 0227   TCO2 29 12/11/2012 0227   O2SAT 91.0 12/11/2012 0227   CBG (last 3)   Recent Labs  12/12/12 0730 12/12/12 1632 12/12/12 1957  GLUCAP 111* 129* 118*    Assessment/Plan: S/P   We'll follow progress   LOS: 2 days    VAN TRIGT III,PETER 12/12/2012

## 2012-12-13 ENCOUNTER — Inpatient Hospital Stay (HOSPITAL_COMMUNITY): Payer: Non-veteran care

## 2012-12-13 LAB — HEPATIC FUNCTION PANEL
ALT: 59 U/L — ABNORMAL HIGH (ref 0–53)
Alkaline Phosphatase: 93 U/L (ref 39–117)
Bilirubin, Direct: <0.1 mg/dL (ref 0.0–0.3)
Total Protein: 6.3 g/dL (ref 6.0–8.3)

## 2012-12-13 LAB — MAGNESIUM: Magnesium: 2.1 mg/dL (ref 1.5–2.5)

## 2012-12-13 LAB — GLUCOSE, CAPILLARY: Glucose-Capillary: 224 mg/dL — ABNORMAL HIGH (ref 70–99)

## 2012-12-13 LAB — PHOSPHORUS: Phosphorus: 3.8 mg/dL (ref 2.3–4.6)

## 2012-12-13 LAB — PROTIME-INR: Prothrombin Time: 15.8 seconds — ABNORMAL HIGH (ref 11.6–15.2)

## 2012-12-13 LAB — HEPARIN LEVEL (UNFRACTIONATED): Heparin Unfractionated: 0.52 IU/mL (ref 0.30–0.70)

## 2012-12-13 LAB — CBC
Hemoglobin: 9.5 g/dL — ABNORMAL LOW (ref 13.0–17.0)
MCH: 32.1 pg (ref 26.0–34.0)
Platelets: 222 10*3/uL (ref 150–400)
RBC: 2.96 MIL/uL — ABNORMAL LOW (ref 4.22–5.81)

## 2012-12-13 LAB — BASIC METABOLIC PANEL
CO2: 29 mEq/L (ref 19–32)
Calcium: 8.9 mg/dL (ref 8.4–10.5)
GFR calc non Af Amer: >90 mL/min (ref >90)
Glucose, Bld: 136 mg/dL — ABNORMAL HIGH (ref 70–99)
Potassium: 3.8 mEq/L (ref 3.5–5.1)
Sodium: 137 mEq/L (ref 135–145)

## 2012-12-13 MED ORDER — WARFARIN SODIUM 7.5 MG PO TABS
7.5000 mg | ORAL_TABLET | Freq: Once | ORAL | Status: AC
  Administered 2012-12-13: 7.5 mg via ORAL
  Filled 2012-12-13: qty 1

## 2012-12-13 NOTE — Progress Notes (Signed)
[facility] PROGRESS NOTE  Dylan Church:[id number] DOB: December 11, 1950 DOA: 12/10/2012 PCP: No PCP Per Patient  Assessment/Plan: Active Problems:   DIABETES MELLITUS   BACK PAIN, CHRONIC- followed at pain clinic, he has an implantable MSO4 pump   CAD h/o PCI,  progression 11/29/12- CABG - X3 12/03/12   S/P CABG x 3   Encephalopathy acute   Pulmonary embolism      Acute pulmonary emboli, however hemodynamically stable without tachycardia and hypoxia.   Postop multivessel CABG  Heparin per Pharmacy, started coumadin 4/16 at night.  - Bilateral LE venous Dopplers negative for DVT.  - TTE , RV function as normal but no TR to evaluate PAP.  - Titrate O2 down as tolerated.   CARDIOVASCULAR  A: CAD s/p CABG on 3/14.  - Continue ASA, Lipitor, Digoxin, Metoprolol PO.     rhabdomyolysis.  Resolved   GASTROINTESTINAL  A: No active issues.  P:  - Continue diet     HEMATOLOGIC  A: No active issues  P:  - Trend CBC.    INFECTIOUS  A: No active issues.  P:  - No intervention required.    ENDOCRINE  A: DM 2. Hyperglycemia.  P:  - SSI and hold home lantus and novolog.   NEUROLOGIC  A: Acute encephalopathy in setting of multiple psychotropic meds / opioid intoxication. UA + opiods, tricyclics (not on Rx list), benzodiazepins (not on Rx list). Negative head CT.  P:  - Continue Gabapentin, Zoloft and Flexeril but hold oxycodone.  - Continue morphine pump.     Code Status: full Family Communication: family updated about patient's clinical progress Disposition Plan:  As above    BRIEF PATIENT DESCRIPTION: 66 with chronic pain syndrome on multiple psychoactive medications / morphine pump and CAD s/p recent CABG, found down unresponsive with pinpoint pupils. Drug screen positive for benzodiazepines and TCA (not on medication list) in addition to opioids.Workup reveled bilateral lower lobe pulmonary emboli. Transferred to [facility] per family request.  SIGNIFICANT EVENTS /  STUDIES:  4/14 Brought to [facility] with altered mental status  4/14 Chest CT angio (outside) >>> small bilateral lower lobe PE  4/14 Head CT (outside) >>> nad  LINES / TUBES:  PIV  CULTURES:  None  ANTIBIOTICS:  None   HPI/Subjective: SUBJECTIVE: Arousable and interactive, normal mental status.   Objective: Filed Vitals:   12/12/12 2036 12/13/12 0415 12/13/12 0500 12/13/12 0959  BP: 108/60 98/60  90/58  Pulse: 95 83  88  Temp: 98 F (36.7 C) 98.2 F (36.8 C)    TempSrc: Oral Oral    Resp: 18 16    Height:      Weight:   96.4 kg (212 lb 8.4 oz)   SpO2: 95% 97%      Intake/Output Summary (Last 24 hours) at 12/13/12 1052 Last data filed at 12/13/12 0600  Gross per 24 hour  Intake 1139.01 ml  Output   1450 ml  Net -310.99 ml    Exam: General: Alert, larthargic  Neuro: Sleepy but arouses to stimulation, pinpoint pupils, protects airway  HEENT: No JVD  Cardiovascular: RRR, no murmurs  Lungs: CTAB, no crackles, no wheezes  Abdomen: RLQ pump subQ, soft, nt, nd  Musculoskeletal: intact  Skin: Right LE +1, no left LE edema    Data Reviewed: Basic Metabolic Panel:  Recent Labs Lab 12/07/12 0520 12/11/12 0109 12/12/12 0420 12/13/12 0500  NA 135 138 139 137  K 5.4* 3.6 3.9 3.8  CL 99  103 103 101  CO2 32 27 28 29   GLUCOSE 104* 163* 117* 136*  BUN 15 11 8 9   CREATININE 0.74 0.68 0.67 0.64  CALCIUM 9.1 8.7 8.7 8.9  MG  --  2.1 2.2 2.1  PHOS  --  4.0 4.0 3.8    Liver Function Tests:  Recent Labs Lab 12/11/12 0109 12/13/12 0500  AST 296* 133*  ALT 77* 59*  ALKPHOS 108 93  BILITOT 0.4 0.2*  PROT 6.2 6.3  ALBUMIN 2.4* 2.3*   No results found for this basename: LIPASE, AMYLASE,  in the last 168 hours No results found for this basename: AMMONIA,  in the last 168 hours  CBC:  Recent Labs Lab 12/07/12 0520 12/11/12 0109 12/12/12 0420 12/13/12 0500  WBC 11.0* 8.4 6.7 6.4  NEUTROABS  --  5.7  --   --   HGB 10.2* 9.3* 9.4* 9.5*  HCT  29.8* 27.1* 28.2* 28.1*  MCV 94.3 93.1 96.2 94.9  PLT 147* 228 220 222    Cardiac Enzymes:  Recent Labs Lab 12/11/12 0109 12/11/12 0715 12/11/12 1235  CKTOTAL 16109*  --   --   TROPONINI <0.30 <0.30 <0.30   BNP (last 3 results) No results found for this basename: PROBNP,  in the last 8760 hours   CBG:  Recent Labs Lab 12/12/12 1957 12/13/12 0029 12/13/12 0411 12/13/12 0600 12/13/12 0745  GLUCAP 118* 182* 137* 109* 155*    No results found for this or any previous visit (from the past 240 hour(s)).   Studies: Dg Chest 2 View  12/13/2012  *RADIOLOGY REPORT*  Clinical Data: Mid chest pain, post CABG  CHEST - 2 VIEW  Comparison: 12/10/2012  Findings: Enlargement of cardiac silhouette post CABG. Mediastinal contours and pulmonary vascularity normal. Minimal peribronchial thickening. Minimal atelectasis at right base. Lungs otherwise clear. Small left pleural effusion. No pneumothorax.  IMPRESSION: Minimal bronchitic changes and right basilar atelectasis. Small pleural effusion.   Original Report Authenticated By: Ulyses Southward, M.D.    Dg Chest 2 View  12/06/2012  *RADIOLOGY REPORT*  Clinical Data: Post CABG, chest soreness  CHEST - 2 VIEW  Comparison: Portable chest x-ray of 12/05/2012  Findings: The left chest tube has been removed.  No pneumothorax is seen.  A venous sheath remains in the SVC.  Small effusions remain and there is mild basilar atelectasis, right greater than left.  IMPRESSION: Left chest tube removed.  No pneumothorax.  Persistent small effusions with basilar atelectasis.   Original Report Authenticated By: Dwyane Dee, M.D.    Dg Chest 2 View  11/30/2012  *RADIOLOGY REPORT*  Clinical Data: Hypertension, back Pain, coronary disease  CHEST - 2 VIEW  Comparison: 11/28/2012  Findings: The heart and pulmonary vascularity are within normal limits.  The lungs are clear bilaterally.  No bony abnormality is seen.  IMPRESSION: No acute abnormality noted.  No change from prior  exam.   Original Report Authenticated By: Alcide Clever, M.D.    Dg Chest Port 1 View  12/05/2012  *RADIOLOGY REPORT*  Clinical Data: Postop CABG  PORTABLE CHEST - 1 VIEW  Comparison: 12/04/2012  Findings: Interval removal of right IJ Swan-Ganz venous catheter. Stable right IJ venous sheath.  Stable left chest tube and mediastinal drain.  No pneumothorax is seen.  Mild bibasilar atelectasis.  Mild cardiomegaly. Postsurgical changes related to prior CABG.  IMPRESSION: Stable left chest tube and mediastinal drain.  No pneumothorax is seen.   Original Report Authenticated By: Charline Bills, M.D.  Dg Chest Portable 1 View In Am  12/04/2012  *RADIOLOGY REPORT*  Clinical Data: Postop study.  PORTABLE CHEST - 1 VIEW  Comparison: Chest x-ray 12/03/2012.  Findings: Previously noted endotracheal and nasogastric tubes have been removed.  Right IJ approach Swan-Ganz catheter remains with tip in the descending right pulmonary artery branch.  The left- sided chest tube with tip in the mid left hemithorax.  Mediastinal drain remains in position.  Lung volumes are low.  There are some bibasilar opacities which presumably reflect some mild postoperative subsegmental atelectasis.  Probable trace bilateral pleural effusions.  No definite pneumothorax.  No evidence of pulmonary edema. Cardiopericardial silhouette appears mildly enlarged, and there appears to be a small amount of gas within the pericardial space (unchanged).  Status post median sternotomy for CABG.  IMPRESSION: 1.  Postoperative changes and support apparatus, as above. 2.  Persistent low lung volumes with bibasilar subsegmental atelectasis and trace bilateral pleural effusions. 3.  Small amount of pneumopericardium is unchanged.   Original Report Authenticated By: Trudie Reed, M.D.    Dg Chest Portable 1 View  12/03/2012  *RADIOLOGY REPORT*  Clinical Data: Status post CABG.  PORTABLE CHEST - 1 VIEW  Comparison: Preoperative x-ray on 11/30/2012  Findings:  Endotracheal tube is present with the tip approximately 3.5 cm above the carina.  Left chest tube is in place with no pneumothorax identified.  Swan-Ganz catheter tip extends into the right pulmonary artery.  Lungs show no edema or significant consolidation.  Mild bilateral lower lobe atelectasis present.  The heart size and mediastinal contours are stable and normal.  IMPRESSION: No pneumothorax.  Mild bilateral lower lobe atelectasis present.   Original Report Authenticated By: Irish Lack, M.D.     Scheduled Meds: . aspirin EC  81 mg Oral Daily  . atorvastatin  20 mg Oral QHS  . budesonide-formoterol  2 puff Inhalation BID  . coumadin book   Does not apply Once  . digoxin  0.25 mg Oral Daily  . furosemide  40 mg Oral Daily  . gabapentin  800 mg Oral TID  . insulin aspart  0-15 Units Subcutaneous Q4H  . levalbuterol  2 puff Inhalation Q6H  . metoprolol tartrate  37.5 mg Oral BID  . sertraline  50 mg Oral Daily  . vancomycin  1,000 mg Intravenous Q12H  . warfarin  7.5 mg Oral ONCE-1800  . warfarin   Does not apply Once  . Warfarin - Pharmacist Dosing Inpatient   Does not apply q1800   Continuous Infusions: . sodium chloride 10 mL/hr at 12/12/12 1900  . heparin 2,050 Units/hr (12/13/12 0041)    Active Problems:   DIABETES MELLITUS   BACK PAIN, CHRONIC- followed at pain clinic, he has an implantable MSO4 pump   CAD h/o PCI,  progression 11/29/12- CABG - X3 12/03/12   S/P CABG x 3   Encephalopathy acute   Pulmonary embolism    Time spent: 40 minutes   [facility]  [facility] Pager [phone]. If 8PM-8AM, please contact night-coverage at [email or web address], password [facility] 12/13/2012, 10:52 AM  LOS: 3 days

## 2012-12-13 NOTE — Progress Notes (Signed)
ANTICOAGULATION CONSULT NOTE - Follow Up Consult  Pharmacy Consult for Heparin and Coumadin Indication: pulmonary embolus  No Known Allergies  Patient Measurements: Height: 5\' 10"  (177.8 cm) Weight: 212 lb 8.4 oz (96.4 kg) IBW/kg (Calculated) : 73 Heparin Dosing Weight: 92.8kg  Vital Signs: Temp: 98.2 F (36.8 C) (04/17 0415) Temp src: Oral (04/17 0415) BP: 98/60 mmHg (04/17 0415) Pulse Rate: 83 (04/17 0415)  Labs:  Recent Labs  12/11/12 0109 12/11/12 0715 12/11/12 1235 12/12/12 0420 12/12/12 1014 12/13/12 0500  HGB 9.3*  --   --  9.4*  --  9.5*  HCT 27.1*  --   --  28.2*  --  28.1*  PLT 228  --   --  220  --  222  APTT 105*  --   --   --   --   --   LABPROT 15.7*  --   --   --   --  15.8*  INR 1.28  --   --   --   --  1.29  HEPARINUNFRC 0.36 0.45  --   --  0.37 0.52  CREATININE 0.68  --   --  0.67  --  0.64  CKTOTAL 65784*  --   --   --   --   --   TROPONINI <0.30 <0.30 <0.30  --   --   --     Estimated Creatinine Clearance: 113 ml/min (by C-G formula based on Cr of 0.64).   Medications:  Heparin 2050 units/hr  Assessment: 62 yo M s/p CABG 12/03/12 was found down at home by family members thought 2/2 oversedation with multiple pain meds/psychotropics.  CT scan at OSH showed small BL PE's and pt was initiated on heparin and coumadin.  Heparin level is therapeutic on 2050 units/hr.  INR esentially at baseline as expected after only 1 dose of Coumadin.  Today is day#2/5 minimum overlap IV:PO required for treatment of PE.  Goal of Therapy:  INR 2-3 Heparin level 0.3-0.7 units/ml Monitor platelets by anticoagulation protocol: Yes   Plan:  1. Continue heparin drip at 2050 units/hr (20.5 ml/hr) 2. Repeat Coumadin 7.5mg  po x 1 today 3. Daily INR, CBC and heparin level 4. Coumadin educational book/video  [employer], [address].D., BCPS Clinical Pharmacist Pager [phone] 12/13/2012 9:09 AM

## 2012-12-13 NOTE — Care Management Note (Addendum)
    Page 1 of 2   12/14/2012     3:51:05 PM   CARE MANAGEMENT NOTE 12/14/2012  Patient:  Dylan Church, Dylan Church   Account Number:  [id number]  Date Initiated:  12/11/2012  Documentation initiated by:  Junius Creamer  Subjective/Objective Assessment:   adm w back pain, encepalopathy     Action/Plan:   lives w wife, pcp [facility], recent ad and was disch w rw and [facility]. have call into mary w [facility].   Anticipated DC Date:  12/15/2012   Anticipated DC Plan:  HOME W HOME HEALTH SERVICES      DC Planning Services  CM consult      [facility] Choice  Resumption Of Svcs/PTA Provider   Choice offered to / List presented to:          [facility] arranged  HH-1 RN  HH-2 PT      [facility] agency  [facility]   Status of service:  In process, will continue to follow Medicare Important Message given?   (If response is "NO", the following Medicare IM given date fields will be blank) Date Medicare IM given:   Date Additional Medicare IM given:    Discharge Disposition:  HOME W HOME HEALTH SERVICES  Per UR Regulation:  Reviewed for med. necessity/level of care/duration of stay  If discussed at Long Length of Stay Meetings, dates discussed:    Comments:  12/14/12 Dylan Solorio,RN,BSN [phone] PT FOR LIK[facility] DC ON SAT 4/19.  FAXED ALL DC RX TO [employer] AT [facility] [phone].  SHE CALLED BACK WITH CONFIRMATION ON RECEIPT OF FAX.  [facility]  PT TO DC ON [place]; STATES SHOULD NOT HAVE A PROBLEM GIVING HIMSELF INJECTIONS, AS HE GIVES HIS OWN INSULIN.  SON TO ASSIST AT DC.  12/13/12 Dylan Souter,RN,BSN [phone] UPDATED APRIL AT [facility] OF PT'S CONDITION.  4/15 0927 Dylan dowell rn,bsn

## 2012-12-13 NOTE — Discharge Summary (Signed)
patient examined and medical record reviewed,agree with above note. VAN TRIGT III,Dylan Church 12/13/2012   

## 2012-12-14 LAB — GLUCOSE, CAPILLARY
Glucose-Capillary: 152 mg/dL — ABNORMAL HIGH (ref 70–99)
Glucose-Capillary: 171 mg/dL — ABNORMAL HIGH (ref 70–99)
Glucose-Capillary: 172 mg/dL — ABNORMAL HIGH (ref 70–99)

## 2012-12-14 LAB — PROTIME-INR: INR: 2.8 — ABNORMAL HIGH (ref 0.00–1.49)

## 2012-12-14 MED ORDER — PNEUMOCOCCAL VAC POLYVALENT 25 MCG/0.5ML IJ INJ
0.5000 mL | INJECTION | Freq: Once | INTRAMUSCULAR | Status: AC
  Administered 2012-12-14: 0.5 mL via INTRAMUSCULAR
  Filled 2012-12-14: qty 0.5

## 2012-12-14 MED ORDER — ASPIRIN 81 MG PO TBEC: 81.0000 mg | DELAYED_RELEASE_TABLET | Freq: Every day | ORAL | Status: AC

## 2012-12-14 MED ORDER — PNEUMOCOCCAL VAC POLYVALENT 25 MCG/0.5ML IJ INJ: 0.5000 mL | INJECTION | INTRAMUSCULAR | Status: DC

## 2012-12-14 MED ORDER — HEPARIN (PORCINE) IN NACL 100-0.45 UNIT/ML-% IJ SOLN
2050.0000 [IU]/h | INTRAMUSCULAR | Status: DC
  Administered 2012-12-14 – 2012-12-15 (×2): 2050 [IU]/h via INTRAVENOUS
  Filled 2012-12-14 (×3): qty 250

## 2012-12-14 MED ORDER — DOXYCYCLINE HYCLATE 50 MG PO CAPS: 100.0000 mg | ORAL_CAPSULE | Freq: Two times a day (BID) | ORAL | Status: DC

## 2012-12-14 MED ORDER — ENOXAPARIN SODIUM 150 MG/ML ~~LOC~~ SOLN: 1.0000 mg/kg | Freq: Two times a day (BID) | SUBCUTANEOUS | Status: DC

## 2012-12-14 MED ORDER — WARFARIN SODIUM 5 MG PO TABS: 5.0000 mg | ORAL_TABLET | Freq: Every day | ORAL | Status: DC

## 2012-12-14 MED ORDER — INSULIN ASPART 100 UNIT/ML ~~LOC~~ SOLN
0.0000 [IU] | Freq: Three times a day (TID) | SUBCUTANEOUS | Status: DC
  Administered 2012-12-15: 3 [IU] via SUBCUTANEOUS

## 2012-12-14 NOTE — Discharge Summary (Signed)
Physician Discharge Summary  Dylan Church MRN: [id number] DOB/AGE: April 30, 1951 62 y.o.  PCP: No PCP Per Patient   Admit date: 12/10/2012 Discharge date: 12/14/2012  Discharge Diagnoses:      DIABETES MELLITUS   BACK PAIN, CHRONIC- followed at pain clinic, he has an implantable MSO4 pump   CAD h/o PCI,  progression 11/29/12- CABG - X3 12/03/12   S/P CABG x 3   Encephalopathy acute   Pulmonary embolism     Medication List    STOP taking these medications       oxyCODONE 5 MG immediate release tablet  Commonly known as:  Oxy IR/ROXICODONE      TAKE these medications       aspirin 81 MG EC tablet  Take 1 tablet (81 mg total) by mouth daily.     atorvastatin 40 MG tablet  Commonly known as:  LIPITOR  Take 20 mg by mouth at bedtime.     budesonide-formoterol 160-4.5 MCG/ACT inhaler  Commonly known as:  SYMBICORT  Inhale 2 puffs into the lungs 2 (two) times daily.     cyclobenzaprine 10 MG tablet  Commonly known as:  FLEXERIL  Take 10 mg by mouth 3 (three) times daily as needed for muscle spasms. For mucle spasms     digoxin 0.25 MG tablet  Commonly known as:  LANOXIN  Take 1 tablet (0.25 mg total) by mouth daily.     DSS 100 MG Caps  Take 200 mg by mouth 2 (two) times daily as needed for constipation.     furosemide 40 MG tablet  Commonly known as:  LASIX  Take 1 tablet (40 mg total) by mouth daily. For 5 Days     gabapentin 800 MG tablet  Commonly known as:  NEURONTIN  Take 800 mg by mouth 3 (three) times daily.     insulin aspart 100 UNIT/ML injection  Commonly known as:  novoLOG  Inject 15 Units into the skin 3 (three) times daily before meals.     insulin detemir 100 UNIT/ML injection  Commonly known as:  LEVEMIR  Inject 45-60 Units into the skin 2 (two) times daily. Takes 60 units in the am & 45 units in the pm     levalbuterol 45 MCG/ACT inhaler  Commonly known as:  XOPENEX HFA  Inhale 2 puffs into the lungs every 6 (six) hours.     metoprolol tartrate 25 MG tablet  Commonly known as:  LOPRESSOR  Take 1.5 tablets (37.5 mg total) by mouth 2 (two) times daily.     potassium chloride 10 MEQ tablet  Commonly known as:  K-DUR  Take 1 tablet (10 mEq total) by mouth daily. For 5 Days     sertraline 100 MG tablet  Commonly known as:  ZOLOFT  Take 50 mg by mouth daily.     warfarin 5 MG tablet  Commonly known as:  COUMADIN  Take 1 tablet (5 mg total) by mouth daily.  Start taking on:  12/15/2012        Discharge Condition: *Stable  Disposition: 01-Home or Self Care   Consults:  Critical care   Significant Diagnostic Studies: Dg Chest 2 View  12/13/2012  *RADIOLOGY REPORT*  Clinical Data: Mid chest pain, post CABG  CHEST - 2 VIEW  Comparison: 12/10/2012  Findings: Enlargement of cardiac silhouette post CABG. Mediastinal contours and pulmonary vascularity normal. Minimal peribronchial thickening. Minimal atelectasis at right base. Lungs otherwise clear. Small left pleural effusion. No pneumothorax.  IMPRESSION: Minimal bronchitic changes and right basilar atelectasis. Small pleural effusion.   Original Report Authenticated By: Ulyses Southward, M.D.    Dg Chest 2 View  12/06/2012  *RADIOLOGY REPORT*  Clinical Data: Post CABG, chest soreness  CHEST - 2 VIEW  Comparison: Portable chest x-ray of 12/05/2012  Findings: The left chest tube has been removed.  No pneumothorax is seen.  A venous sheath remains in the SVC.  Small effusions remain and there is mild basilar atelectasis, right greater than left.  IMPRESSION: Left chest tube removed.  No pneumothorax.  Persistent small effusions with basilar atelectasis.   Original Report Authenticated By: Dwyane Dee, M.D.    Dg Chest 2 View  11/30/2012  *RADIOLOGY REPORT*  Clinical Data: Hypertension, back Pain, coronary disease  CHEST - 2 VIEW  Comparison: 11/28/2012  Findings: The heart and pulmonary vascularity are within normal limits.  The lungs are clear bilaterally.  No bony  abnormality is seen.  IMPRESSION: No acute abnormality noted.  No change from prior exam.   Original Report Authenticated By: Alcide Clever, M.D.    Dg Chest Port 1 View  12/05/2012  *RADIOLOGY REPORT*  Clinical Data: Postop CABG  PORTABLE CHEST - 1 VIEW  Comparison: 12/04/2012  Findings: Interval removal of right IJ Swan-Ganz venous catheter. Stable right IJ venous sheath.  Stable left chest tube and mediastinal drain.  No pneumothorax is seen.  Mild bibasilar atelectasis.  Mild cardiomegaly. Postsurgical changes related to prior CABG.  IMPRESSION: Stable left chest tube and mediastinal drain.  No pneumothorax is seen.   Original Report Authenticated By: Charline Bills, M.D.    Dg Chest Portable 1 View In Am  12/04/2012  *RADIOLOGY REPORT*  Clinical Data: Postop study.  PORTABLE CHEST - 1 VIEW  Comparison: Chest x-ray 12/03/2012.  Findings: Previously noted endotracheal and nasogastric tubes have been removed.  Right IJ approach Swan-Ganz catheter remains with tip in the descending right pulmonary artery branch.  The left- sided chest tube with tip in the mid left hemithorax.  Mediastinal drain remains in position.  Lung volumes are low.  There are some bibasilar opacities which presumably reflect some mild postoperative subsegmental atelectasis.  Probable trace bilateral pleural effusions.  No definite pneumothorax.  No evidence of pulmonary edema. Cardiopericardial silhouette appears mildly enlarged, and there appears to be a small amount of gas within the pericardial space (unchanged).  Status post median sternotomy for CABG.  IMPRESSION: 1.  Postoperative changes and support apparatus, as above. 2.  Persistent low lung volumes with bibasilar subsegmental atelectasis and trace bilateral pleural effusions. 3.  Small amount of pneumopericardium is unchanged.   Original Report Authenticated By: Trudie Reed, M.D.    Dg Chest Portable 1 View  12/03/2012  *RADIOLOGY REPORT*  Clinical Data: Status post CABG.   PORTABLE CHEST - 1 VIEW  Comparison: Preoperative x-ray on 11/30/2012  Findings: Endotracheal tube is present with the tip approximately 3.5 cm above the carina.  Left chest tube is in place with no pneumothorax identified.  Swan-Ganz catheter tip extends into the right pulmonary artery.  Lungs show no edema or significant consolidation.  Mild bilateral lower lobe atelectasis present.  The heart size and mediastinal contours are stable and normal.  IMPRESSION: No pneumothorax.  Mild bilateral lower lobe atelectasis present.   Original Report Authenticated By: Irish Lack, M.D.        Microbiology: No results found for this or any previous visit (from the past 240 hour(s)).   Labs: Results for orders  placed during the hospital encounter of 12/10/12 (from the past 48 hour(s))  GLUCOSE, CAPILLARY     Status: Abnormal   Collection Time    12/12/12  4:32 PM      Result Value Range   Glucose-Capillary 129 (*) 70 - 99 mg/dL   Comment 1 Documented in Chart     Comment 2 Notify RN    GLUCOSE, CAPILLARY     Status: Abnormal   Collection Time    12/12/12  7:57 PM      Result Value Range   Glucose-Capillary 118 (*) 70 - 99 mg/dL  GLUCOSE, CAPILLARY     Status: Abnormal   Collection Time    12/13/12 12:29 AM      Result Value Range   Glucose-Capillary 182 (*) 70 - 99 mg/dL  GLUCOSE, CAPILLARY     Status: Abnormal   Collection Time    12/13/12  4:11 AM      Result Value Range   Glucose-Capillary 137 (*) 70 - 99 mg/dL  CBC     Status: Abnormal   Collection Time    12/13/12  5:00 AM      Result Value Range   WBC 6.4  4.0 - 10.5 K/uL   RBC 2.96 (*) 4.22 - 5.81 MIL/uL   Hemoglobin 9.5 (*) 13.0 - 17.0 g/dL   HCT 16.1 (*) 09.6 - 04.5 %   MCV 94.9  78.0 - 100.0 fL   MCH 32.1  26.0 - 34.0 pg   MCHC 33.8  30.0 - 36.0 g/dL   RDW 40.9  81.1 - 91.4 %   Platelets 222  150 - 400 K/uL  BASIC METABOLIC PANEL     Status: Abnormal   Collection Time    12/13/12  5:00 AM      Result Value Range    Sodium 137  135 - 145 mEq/L   Potassium 3.8  3.5 - 5.1 mEq/L   Chloride 101  96 - 112 mEq/L   CO2 29  19 - 32 mEq/L   Glucose, Bld 136 (*) 70 - 99 mg/dL   BUN 9  6 - 23 mg/dL   Creatinine, Ser 7.82  0.50 - 1.35 mg/dL   Calcium 8.9  8.4 - 95.6 mg/dL   GFR calc non Af Amer >90  >90 mL/min   GFR calc Af Amer >90  >90 mL/min   Comment:            The eGFR has been calculated     using the CKD EPI equation.     This calculation has not been     validated in all clinical     situations.     eGFR's persistently     <90 mL/min signify     possible Chronic Kidney Disease.  MAGNESIUM     Status: None   Collection Time    12/13/12  5:00 AM      Result Value Range   Magnesium 2.1  1.5 - 2.5 mg/dL  PHOSPHORUS     Status: None   Collection Time    12/13/12  5:00 AM      Result Value Range   Phosphorus 3.8  2.3 - 4.6 mg/dL  PROTIME-INR     Status: Abnormal   Collection Time    12/13/12  5:00 AM      Result Value Range   Prothrombin Time 15.8 (*) 11.6 - 15.2 seconds   INR 1.29  0.00 - 1.49  HEPARIN LEVEL (  UNFRACTIONATED)     Status: None   Collection Time    12/13/12  5:00 AM      Result Value Range   Heparin Unfractionated 0.52  0.30 - 0.70 IU/mL   Comment:            IF HEPARIN RESULTS ARE BELOW     EXPECTED VALUES, AND PATIENT     DOSAGE HAS BEEN CONFIRMED,     SUGGEST FOLLOW UP TESTING     OF ANTITHROMBIN III LEVELS.  HEPATIC FUNCTION PANEL     Status: Abnormal   Collection Time    12/13/12  5:00 AM      Result Value Range   Total Protein 6.3  6.0 - 8.3 g/dL   Albumin 2.3 (*) 3.5 - 5.2 g/dL   AST 147 (*) 0 - 37 U/L   ALT 59 (*) 0 - 53 U/L   Alkaline Phosphatase 93  39 - 117 U/L   Total Bilirubin 0.2 (*) 0.3 - 1.2 mg/dL   Bilirubin, Direct <8.2  0.0 - 0.3 mg/dL   Indirect Bilirubin NOT CALCULATED  0.3 - 0.9 mg/dL  GLUCOSE, CAPILLARY     Status: Abnormal   Collection Time    12/13/12  6:00 AM      Result Value Range   Glucose-Capillary 109 (*) 70 - 99 mg/dL  GLUCOSE,  CAPILLARY     Status: Abnormal   Collection Time    12/13/12  7:45 AM      Result Value Range   Glucose-Capillary 155 (*) 70 - 99 mg/dL   Comment 1 Documented in Chart     Comment 2 Notify RN    GLUCOSE, CAPILLARY     Status: Abnormal   Collection Time    12/13/12 11:21 AM      Result Value Range   Glucose-Capillary 194 (*) 70 - 99 mg/dL   Comment 1 Documented in Chart     Comment 2 Notify RN    GLUCOSE, CAPILLARY     Status: Abnormal   Collection Time    12/13/12  4:26 PM      Result Value Range   Glucose-Capillary 224 (*) 70 - 99 mg/dL   Comment 1 Documented in Chart     Comment 2 Notify RN    GLUCOSE, CAPILLARY     Status: Abnormal   Collection Time    12/13/12  8:26 PM      Result Value Range   Glucose-Capillary 111 (*) 70 - 99 mg/dL  GLUCOSE, CAPILLARY     Status: Abnormal   Collection Time    12/14/12 12:10 AM      Result Value Range   Glucose-Capillary 172 (*) 70 - 99 mg/dL   Comment 1 Notify RN    GLUCOSE, CAPILLARY     Status: Abnormal   Collection Time    12/14/12  4:20 AM      Result Value Range   Glucose-Capillary 152 (*) 70 - 99 mg/dL   Comment 1 Notify RN    PROTIME-INR     Status: Abnormal   Collection Time    12/14/12  6:53 AM      Result Value Range   Prothrombin Time 28.1 (*) 11.6 - 15.2 seconds   INR 2.80 (*) 0.00 - 1.49  HEPARIN LEVEL (UNFRACTIONATED)     Status: None   Collection Time    12/14/12  6:53 AM      Result Value Range   Heparin Unfractionated 0.33  0.30 -  0.70 IU/mL   Comment:            IF HEPARIN RESULTS ARE BELOW     EXPECTED VALUES, AND PATIENT     DOSAGE HAS BEEN CONFIRMED,     SUGGEST FOLLOW UP TESTING     OF ANTITHROMBIN III LEVELS.  GLUCOSE, CAPILLARY     Status: Abnormal   Collection Time    12/14/12  7:38 AM      Result Value Range   Glucose-Capillary 171 (*) 70 - 99 mg/dL   Comment 1 Documented in Chart     Comment 2 Notify RN    GLUCOSE, CAPILLARY     Status: Abnormal   Collection Time    12/14/12 11:00 AM       Result Value Range   Glucose-Capillary 163 (*) 70 - 99 mg/dL   Comment 1 Documented in Chart     Comment 2 Notify RN       HPI :*61 with chronic pain syndrome on multiple psychoactive medications / morphine pump and CAD s/p recent CABG, found down unresponsive with pinpoint pupils. Drug screen positive for benzodiazepines and TCA (not on medication list) in addition to opioids.patient claimed that he took about 5-6 with extra dose of Flexeril, Workup reveled bilateral lower lobe pulmonary emboli. Transferred to [facility] per family request.    HOSPITAL COURSE:   Acute pulmonary emboli, however hemodynamically stable without tachycardia and hypoxia.  Postop multivessel CABG, found to have acute on the embolism Started on Heparin per Pharmacy, started coumadin 4/16 received 7.5 mg x2 doses, INR jumped from 1.29 to 2.80 - Bilateral LE venous Dopplers negative for DVT.  - TTE , RV function as normal but no TR to evaluate PAP.  Patient will be discharged home on Coumadin with home health RN to monitor his INR results to primary care provider   CARDIOVASCULAR  A: CAD s/p CABG on 3/14.  - Continue ASA, Lipitor, Digoxin, Metoprolol PO.   rhabdomyolysis. -Total CK was 46962 Resolved   Anemia. The hemoglobin of around 10 Stable for the last couple of days    ENDOCRINE  A: DM 2. Hyperglycemia.  Resume home dose of Levemir and sliding scale insulin  .  NEUROLOGIC  A: Acute encephalopathy in setting of multiple psychotropic meds / opioid intoxication. UA + opiods, tricyclics (not on Rx list), benzodiazepins (not on Rx list). Negative head CT.  P:  - Continue Gabapentin, Zoloft and Flexeril but oxycodone discontinued, - Continue morphine pump.       Discharge Exam: * Blood pressure 90/54, pulse 74, temperature 97.6 F (36.4 C), temperature source Oral, resp. rate 18, height 5\' 10"  (1.778 m), weight 96.5 kg (212 lb 11.9 oz), SpO2 98.00%.  General: Alert, larthargic  Neuro: Sleepy but  arouses to stimulation, pinpoint pupils, protects airway  HEENT: No JVD  Cardiovascular: RRR, no murmurs  Lungs: CTAB, no crackles, no wheezes  Abdomen: RLQ pump subQ, soft, nt, nd  Musculoskeletal: intact  Skin: Right LE +1, no left LE edema         Future Appointments Provider Department Dept Phone   01/02/2013 2:00 PM Kerin Perna, MD [facility] [place] [phone]        Signed: Richarda Overlie 12/14/2012, 12:38 PM

## 2012-12-14 NOTE — Progress Notes (Signed)
ANTICOAGULATION CONSULT NOTE - Follow Up Consult  Pharmacy Consult for Heparin and Coumadin Indication: pulmonary embolus  No Known Allergies  Patient Measurements: Height: 5\' 10"  (177.8 cm) Weight: 212 lb 11.9 oz (96.5 kg) IBW/kg (Calculated) : 73 Heparin Dosing Weight: 92.8kg  Vital Signs: Temp: 97.6 F (36.4 C) (04/18 0418) Temp src: Oral (04/18 0418) BP: 100/58 mmHg (04/18 0418) Pulse Rate: 85 (04/18 0418)  Labs:  Recent Labs  12/11/12 1235 12/12/12 0420 12/12/12 1014 12/13/12 0500 12/14/12 0653  HGB  --  9.4*  --  9.5*  --   HCT  --  28.2*  --  28.1*  --   PLT  --  220  --  222  --   LABPROT  --   --   --  15.8* 28.1*  INR  --   --   --  1.29 2.80*  HEPARINUNFRC  --   --  0.37 0.52 0.33  CREATININE  --  0.67  --  0.64  --   TROPONINI <0.30  --   --   --   --     Estimated Creatinine Clearance: 113 ml/min (by C-G formula based on Cr of 0.64).   Medications:  Heparin 2050 units/hr  Assessment: 62 yo M s/p CABG 12/03/12 was found down at home by family members thought 2/2 oversedation with multiple pain meds/psychotropics.  CT scan at OSH showed small BL PE's and pt was initiated on heparin and coumadin.  Heparin level 0.33 is therapeutic on 2050 units/hr.  INR 1.29> 2.8 after 2 doses Coumadin 7.5mg .  Quick jump.  4/17 cbc stable, no bleeding noted.  Today is day#2/5 minimum overlap IV:PO required for treatment of PE. Noted pt wants to go home - he will need Lovenox 100mg  sq q12 x4 more days if he discharges today.    Goal of Therapy:  INR 2-3 Heparin level 0.3-0.7 units/ml Monitor platelets by anticoagulation protocol: Yes   Plan:  1. Continue heparin drip at 2050 units/hr (20.5 ml/hr) 2. Hold Coumadin today given big jump 3. Daily INR, CBC and heparin level 4. Coumadin educational book/video  Leota Sauers Pharm.D. CPP, BCPS Clinical Pharmacist [phone] 12/14/2012 8:44 AM

## 2012-12-14 NOTE — Progress Notes (Signed)
Hold DC as pt need debridement of his groin, placed on vanc by CTS

## 2012-12-14 NOTE — Progress Notes (Signed)
Subjective:  Mr. Abdallah states he is ready to be discharged.  He complains of some back pain which is chronic.  Objective: Vital signs in last 24 hours: Temp:  [97 F (36.1 C)-98.6 F (37 C)] 97.6 F (36.4 C) (04/18 0418) Pulse Rate:  [85-97] 85 (04/18 0418) Cardiac Rhythm:  [-] Normal sinus rhythm (04/17 2040) Resp:  [18] 18 (04/18 0418) BP: (90-112)/(58-70) 100/58 mmHg (04/18 0418) SpO2:  [95 %-98 %] 98 % (04/18 0418) Weight:  [212 lb 11.9 oz (96.5 kg)] 212 lb 11.9 oz (96.5 kg) (04/18 0418)  Intake/Output from previous day: 04/17 0701 - 04/18 0700 In: 1526 [P.O.:960; I.V.:366; IV Piggyback:200] Out: 2000 [Urine:2000] Intake/Output this shift: Total I/O In: 240 [P.O.:240] Out: 425 [Urine:425]  General appearance: alert, cooperative and no distress Heart: regular rate and rhythm Lungs: clear to auscultation bilaterally Wound: LLE lower thigh and leg incision clean and dry, Left groin incision with yellow slough present  Lab Results:  Recent Labs  12/12/12 0420 12/13/12 0500  WBC 6.7 6.4  HGB 9.4* 9.5*  HCT 28.2* 28.1*  PLT 220 222   BMET:  Recent Labs  12/12/12 0420 12/13/12 0500  NA 139 137  K 3.9 3.8  CL 103 101  CO2 28 29  GLUCOSE 117* 136*  BUN 8 9  CREATININE 0.67 0.64  CALCIUM 8.7 8.9    PT/INR:  Recent Labs  12/13/12 0500  LABPROT 15.8*  INR 1.29   ABG    Component Value Date/Time   PHART 7.407 12/11/2012 0227   HCO3 27.7* 12/11/2012 0227   TCO2 29 12/11/2012 0227   O2SAT 91.0 12/11/2012 0227   CBG (last 3)   Recent Labs  12/13/12 2026 12/14/12 0010 12/14/12 0420  GLUCAP 111* 172* 152*    Assessment/Plan:  1. LLE groin incision cellulitic- will need bedside debridement, will perform today, continue ABX 2. Bilateral PE, on IV Heparin, coumadin initiated 3. CV- NSR good rate and pressure control 4. Dispo- plan per primary team   LOS: 4 days    Lowella Dandy 12/14/2012

## 2012-12-14 NOTE — Progress Notes (Signed)
Debridement to left groin was completed around 1030am per Gershon Crane; PA. Mamie Levers

## 2012-12-15 LAB — DIGOXIN LEVEL: Digoxin Level: 0.8 ng/mL (ref 0.8–2.0)

## 2012-12-15 LAB — GLUCOSE, CAPILLARY

## 2012-12-15 LAB — HEPARIN LEVEL (UNFRACTIONATED): Heparin Unfractionated: 0.27 IU/mL — ABNORMAL LOW (ref 0.30–0.70)

## 2012-12-15 LAB — PROTIME-INR: INR: 3.01 — ABNORMAL HIGH (ref 0.00–1.49)

## 2012-12-15 MED ORDER — DOXYCYCLINE HYCLATE 50 MG PO CAPS: 100.0000 mg | ORAL_CAPSULE | Freq: Two times a day (BID) | ORAL | Status: AC

## 2012-12-15 MED ORDER — WARFARIN SODIUM 5 MG PO TABS: 5.0000 mg | ORAL_TABLET | Freq: Every day | ORAL | Status: DC

## 2012-12-15 MED ORDER — ENOXAPARIN SODIUM 100 MG/ML ~~LOC~~ SOLN
95.0000 mg | Freq: Once | SUBCUTANEOUS | Status: AC
  Administered 2012-12-15: 95 mg via SUBCUTANEOUS
  Filled 2012-12-15: qty 1

## 2012-12-15 NOTE — Progress Notes (Addendum)
                    [address]            [place] [address]          [phone]          Subjective: Feels well, wants to go home.   Objective: Vital signs in last 24 hours: Patient Vitals for the past 24 hrs:  BP Temp Temp src Pulse Resp SpO2 Weight  12/15/12 0822 - - - - - 99 % -  12/15/12 0700 98/60 mmHg - - - - - -  12/15/12 0613 81/45 mmHg 97.8 F (36.6 C) Oral 89 17 98 % 213 lb 6.5 oz (96.8 kg)  12/15/12 0243 - - - 99 18 98 % -  12/14/12 2043 - - - 99 18 98 % -  12/14/12 1950 108/66 mmHg 97.9 F (36.6 C) Oral 99 18 99 % -  12/14/12 1421 116/68 mmHg 97.5 F (36.4 C) Oral 96 18 98 % -  12/14/12 1142 - - - 74 - - -  12/14/12 1132 90/54 mmHg - - 84 - - -   Current Weight  12/15/12 213 lb 6.5 oz (96.8 kg)     Intake/Output from previous day: 04/18 0701 - 04/19 0700 In: 2006 [P.O.:1440; I.V.:366; IV Piggyback:200] Out: 1325 [Urine:1325]    PHYSICAL EXAM:  Heart: RRR Lungs: Clear Wound: Left groin wound clean and dry, minimal erythema, lower leg wounds stable.     Lab Results: CBC: Recent Labs  12/13/12 0500  WBC 6.4  HGB 9.5*  HCT 28.1*  PLT 222   BMET:  Recent Labs  12/13/12 0500  NA 137  K 3.8  CL 101  CO2 29  GLUCOSE 136*  BUN 9  CREATININE 0.64  CALCIUM 8.9    PT/INR:  Recent Labs  12/15/12 0635  LABPROT 29.6*  INR 3.01*      Assessment/Plan: Leg wound stable, improving with abx, debridement.  Continue current care. PE- Coumadin therapeutic. Ok to d/c home from our standpoint on po abx when ok with primary service.  Will arrange OP follow up.   LOS: 5 days    COLLINS,GINA H 12/15/2012  I have seen and examined the patient and agree with the assessment and plan as outlined.  Plan per medical team.  Purcell Nails 12/15/2012 11:29 AM

## 2012-12-15 NOTE — Discharge Summary (Signed)
Dylan Church  MRN: [id number]  DOB/AGE: 30-Nov-1950 62 y.o.  PCP: No PCP Per Patient  Admit date: 12/10/2012  Discharge date: 12/15/2012    Discharge Diagnoses:  DIABETES MELLITUS  BACK PAIN, CHRONIC- followed at pain clinic, he has an implantable MSO4 pump  CAD h/o PCI, progression 11/29/12- CABG - X3 12/03/12  S/P CABG x 3  Encephalopathy acute  Pulmonary embolism      Medication List     STOP taking these medications       oxyCODONE 5 MG immediate release tablet    Commonly known as: Oxy IR/ROXICODONE     TAKE these medications       aspirin 81 MG EC tablet    Take 1 tablet (81 mg total) by mouth daily.    atorvastatin 40 MG tablet    Commonly known as: LIPITOR    Take 20 mg by mouth at bedtime.    budesonide-formoterol 160-4.5 MCG/ACT inhaler    Commonly known as: SYMBICORT    Inhale 2 puffs into the lungs 2 (two) times daily.    cyclobenzaprine 10 MG tablet    Commonly known as: FLEXERIL    Take 10 mg by mouth 3 (three) times daily as needed for muscle spasms. For mucle spasms    digoxin 0.25 MG tablet    Commonly known as: LANOXIN    Take 1 tablet (0.25 mg total) by mouth daily.    DSS 100 MG Caps    Take 200 mg by mouth 2 (two) times daily as needed for constipation.    furosemide 40 MG tablet    Commonly known as: LASIX    Take 1 tablet (40 mg total) by mouth daily. For 5 Days    gabapentin 800 MG tablet    Commonly known as: NEURONTIN    Take 800 mg by mouth 3 (three) times daily.    insulin aspart 100 UNIT/ML injection    Commonly known as: novoLOG    Inject 15 Units into the skin 3 (three) times daily before meals.    insulin detemir 100 UNIT/ML injection    Commonly known as: LEVEMIR    Inject 45-60 Units into the skin 2 (two) times daily. Takes 60 units in the am & 45 units in the pm    levalbuterol 45 MCG/ACT inhaler    Commonly known as: XOPENEX HFA    Inhale 2 puffs into the lungs every 6 (six) hours.    metoprolol tartrate 25 MG tablet    Commonly  known as: LOPRESSOR    Take 1.5 tablets (37.5 mg total) by mouth 2 (two) times daily.    potassium chloride 10 MEQ tablet    Commonly known as: K-DUR    Take 1 tablet (10 mEq total) by mouth daily. For 5 Days    sertraline 100 MG tablet    Commonly known as: ZOLOFT    Take 50 mg by mouth daily.    warfarin 5 MG tablet    Commonly known as: COUMADIN    Take 1 tablet (5 mg total) by mouth daily.    Start taking on: 12/15/2012      Discharge Condition: *Stable  Disposition: 01-Home or Self Care  Consults:  Critical care  Significant Diagnostic Studies:  Dg Chest 2 View  12/13/2012 *RADIOLOGY REPORT* Clinical Data: Mid chest pain, post CABG CHEST - 2 VIEW Comparison: 12/10/2012 Findings: Enlargement of cardiac silhouette post CABG. Mediastinal contours and pulmonary vascularity normal. Minimal peribronchial thickening. Minimal atelectasis  at right base. Lungs otherwise clear. Small left pleural effusion. No pneumothorax. IMPRESSION: Minimal bronchitic changes and right basilar atelectasis. Small pleural effusion. Original Report Authenticated By: Ulyses Southward, M.D.  Dg Chest 2 View  12/06/2012 *RADIOLOGY REPORT* Clinical Data: Post CABG, chest soreness CHEST - 2 VIEW Comparison: Portable chest x-ray of 12/05/2012 Findings: The left chest tube has been removed. No pneumothorax is seen. A venous sheath remains in the SVC. Small effusions remain and there is mild basilar atelectasis, right greater than left. IMPRESSION: Left chest tube removed. No pneumothorax. Persistent small effusions with basilar atelectasis. Original Report Authenticated By: Dwyane Dee, M.D.  Dg Chest 2 View  11/30/2012 *RADIOLOGY REPORT* Clinical Data: Hypertension, back Pain, coronary disease CHEST - 2 VIEW Comparison: 11/28/2012 Findings: The heart and pulmonary vascularity are within normal limits. The lungs are clear bilaterally. No bony abnormality is seen. IMPRESSION: No acute abnormality noted. No change from prior exam.  Original Report Authenticated By: Alcide Clever, M.D.  Dg Chest Port 1 View  12/05/2012 *RADIOLOGY REPORT* Clinical Data: Postop CABG PORTABLE CHEST - 1 VIEW Comparison: 12/04/2012 Findings: Interval removal of right IJ Swan-Ganz venous catheter. Stable right IJ venous sheath. Stable left chest tube and mediastinal drain. No pneumothorax is seen. Mild bibasilar atelectasis. Mild cardiomegaly. Postsurgical changes related to prior CABG. IMPRESSION: Stable left chest tube and mediastinal drain. No pneumothorax is seen. Original Report Authenticated By: Charline Bills, M.D.  Dg Chest Portable 1 View In Am  12/04/2012 *RADIOLOGY REPORT* Clinical Data: Postop study. PORTABLE CHEST - 1 VIEW Comparison: Chest x-ray 12/03/2012. Findings: Previously noted endotracheal and nasogastric tubes have been removed. Right IJ approach Swan-Ganz catheter remains with tip in the descending right pulmonary artery branch. The left- sided chest tube with tip in the mid left hemithorax. Mediastinal drain remains in position. Lung volumes are low. There are some bibasilar opacities which presumably reflect some mild postoperative subsegmental atelectasis. Probable trace bilateral pleural effusions. No definite pneumothorax. No evidence of pulmonary edema. Cardiopericardial silhouette appears mildly enlarged, and there appears to be a small amount of gas within the pericardial space (unchanged). Status post median sternotomy for CABG. IMPRESSION: 1. Postoperative changes and support apparatus, as above. 2. Persistent low lung volumes with bibasilar subsegmental atelectasis and trace bilateral pleural effusions. 3. Small amount of pneumopericardium is unchanged. Original Report Authenticated By: Trudie Reed, M.D.  Dg Chest Portable 1 View  12/03/2012 *RADIOLOGY REPORT* Clinical Data: Status post CABG. PORTABLE CHEST - 1 VIEW Comparison: Preoperative x-ray on 11/30/2012 Findings: Endotracheal tube is present with the tip approximately 3.5  cm above the carina. Left chest tube is in place with no pneumothorax identified. Swan-Ganz catheter tip extends into the right pulmonary artery. Lungs show no edema or significant consolidation. Mild bilateral lower lobe atelectasis present. The heart size and mediastinal contours are stable and normal. IMPRESSION: No pneumothorax. Mild bilateral lower lobe atelectasis present. Original Report Authenticated By: Irish Lack, M.D.  Microbiology:  No results found for this or any previous visit (from the past 240 hour(s)).  Labs:  Results for orders placed during the hospital encounter of 12/10/12 (from the past 48 hour(s))   GLUCOSE, CAPILLARY Status: Abnormal    Collection Time    12/12/12 4:32 PM   Result  Value  Range    Glucose-Capillary  129 (*)  70 - 99 mg/dL    Comment 1  Documented in Chart     Comment 2  Notify RN    GLUCOSE, CAPILLARY Status: Abnormal  Collection Time    12/12/12 7:57 PM   Result  Value  Range    Glucose-Capillary  118 (*)  70 - 99 mg/dL   GLUCOSE, CAPILLARY Status: Abnormal    Collection Time    12/13/12 12:29 AM   Result  Value  Range    Glucose-Capillary  182 (*)  70 - 99 mg/dL   GLUCOSE, CAPILLARY Status: Abnormal    Collection Time    12/13/12 4:11 AM   Result  Value  Range    Glucose-Capillary  137 (*)  70 - 99 mg/dL   CBC Status: Abnormal    Collection Time    12/13/12 5:00 AM   Result  Value  Range    WBC  6.4  4.0 - 10.5 K/uL    RBC  2.96 (*)  4.22 - 5.81 MIL/uL    Hemoglobin  9.5 (*)  13.0 - 17.0 g/dL    HCT  16.1 (*)  09.6 - 52.0 %    MCV  94.9  78.0 - 100.0 fL    MCH  32.1  26.0 - 34.0 pg    MCHC  33.8  30.0 - 36.0 g/dL    RDW  04.5  40.9 - 81.1 %    Platelets  222  150 - 400 K/uL   BASIC METABOLIC PANEL Status: Abnormal    Collection Time    12/13/12 5:00 AM   Result  Value  Range    Sodium  137  135 - 145 mEq/L    Potassium  3.8  3.5 - 5.1 mEq/L    Chloride  101  96 - 112 mEq/L    CO2  29  19 - 32 mEq/L    Glucose, Bld  136  (*)  70 - 99 mg/dL    BUN  9  6 - 23 mg/dL    Creatinine, Ser  9.14  0.50 - 1.35 mg/dL    Calcium  8.9  8.4 - 10.5 mg/dL    GFR calc non Af Amer  >90  >90 mL/min    GFR calc Af Amer  >90  >90 mL/min    Comment:      The eGFR has been calculated     using the CKD EPI equation.     This calculation has not been     validated in all clinical     situations.     eGFR's persistently     <90 mL/min signify     possible Chronic Kidney Disease.   MAGNESIUM Status: None    Collection Time    12/13/12 5:00 AM   Result  Value  Range    Magnesium  2.1  1.5 - 2.5 mg/dL   PHOSPHORUS Status: None    Collection Time    12/13/12 5:00 AM   Result  Value  Range    Phosphorus  3.8  2.3 - 4.6 mg/dL   PROTIME-INR Status: Abnormal    Collection Time    12/13/12 5:00 AM   Result  Value  Range    Prothrombin Time  15.8 (*)  11.6 - 15.2 seconds    INR  1.29  0.00 - 1.49   HEPARIN LEVEL (UNFRACTIONATED) Status: None    Collection Time    12/13/12 5:00 AM   Result  Value  Range    Heparin Unfractionated  0.52  0.30 - 0.70 IU/mL    Comment:      IF HEPARIN RESULTS ARE BELOW  EXPECTED VALUES, AND PATIENT     DOSAGE HAS BEEN CONFIRMED,     SUGGEST FOLLOW UP TESTING     OF ANTITHROMBIN III LEVELS.   HEPATIC FUNCTION PANEL Status: Abnormal    Collection Time    12/13/12 5:00 AM   Result  Value  Range    Total Protein  6.3  6.0 - 8.3 g/dL    Albumin  2.3 (*)  3.5 - 5.2 g/dL    AST  161 (*)  0 - 37 U/L    ALT  59 (*)  0 - 53 U/L    Alkaline Phosphatase  93  39 - 117 U/L    Total Bilirubin  0.2 (*)  0.3 - 1.2 mg/dL    Bilirubin, Direct  <0.9  0.0 - 0.3 mg/dL    Indirect Bilirubin  NOT CALCULATED  0.3 - 0.9 mg/dL   GLUCOSE, CAPILLARY Status: Abnormal    Collection Time    12/13/12 6:00 AM   Result  Value  Range    Glucose-Capillary  109 (*)  70 - 99 mg/dL   GLUCOSE, CAPILLARY Status: Abnormal    Collection Time    12/13/12 7:45 AM   Result  Value  Range    Glucose-Capillary  155 (*)  70  - 99 mg/dL    Comment 1  Documented in Chart     Comment 2  Notify RN    GLUCOSE, CAPILLARY Status: Abnormal    Collection Time    12/13/12 11:21 AM   Result  Value  Range    Glucose-Capillary  194 (*)  70 - 99 mg/dL    Comment 1  Documented in Chart     Comment 2  Notify RN    GLUCOSE, CAPILLARY Status: Abnormal    Collection Time    12/13/12 4:26 PM   Result  Value  Range    Glucose-Capillary  224 (*)  70 - 99 mg/dL    Comment 1  Documented in Chart     Comment 2  Notify RN    GLUCOSE, CAPILLARY Status: Abnormal    Collection Time    12/13/12 8:26 PM   Result  Value  Range    Glucose-Capillary  111 (*)  70 - 99 mg/dL   GLUCOSE, CAPILLARY Status: Abnormal    Collection Time    12/14/12 12:10 AM   Result  Value  Range    Glucose-Capillary  172 (*)  70 - 99 mg/dL    Comment 1  Notify RN    GLUCOSE, CAPILLARY Status: Abnormal    Collection Time    12/14/12 4:20 AM   Result  Value  Range    Glucose-Capillary  152 (*)  70 - 99 mg/dL    Comment 1  Notify RN    PROTIME-INR Status: Abnormal    Collection Time    12/14/12 6:53 AM   Result  Value  Range    Prothrombin Time  28.1 (*)  11.6 - 15.2 seconds    INR  2.80 (*)  0.00 - 1.49   HEPARIN LEVEL (UNFRACTIONATED) Status: None    Collection Time    12/14/12 6:53 AM   Result  Value  Range    Heparin Unfractionated  0.33  0.30 - 0.70 IU/mL    Comment:      IF HEPARIN RESULTS ARE BELOW     EXPECTED VALUES, AND PATIENT     DOSAGE HAS BEEN CONFIRMED,     SUGGEST FOLLOW UP  TESTING     OF ANTITHROMBIN III LEVELS.   GLUCOSE, CAPILLARY Status: Abnormal    Collection Time    12/14/12 7:38 AM   Result  Value  Range    Glucose-Capillary  171 (*)  70 - 99 mg/dL    Comment 1  Documented in Chart     Comment 2  Notify RN    GLUCOSE, CAPILLARY Status: Abnormal    Collection Time    12/14/12 11:00 AM   Result  Value  Range    Glucose-Capillary  163 (*)  70 - 99 mg/dL    Comment 1  Documented in Chart     Comment 2  Notify RN      HPI :*61 with chronic pain syndrome on multiple psychoactive medications / morphine pump and CAD s/p recent CABG, found down unresponsive with pinpoint pupils. Drug screen positive for benzodiazepines and TCA (not on medication list) in addition to opioids.patient claimed that he took about 5-6 with extra dose of Flexeril, Workup reveled bilateral lower lobe pulmonary emboli. Transferred to [facility] per family request.    HOSPITAL COURSE:  Acute pulmonary emboli, however hemodynamically stable without tachycardia and hypoxia.  Postop multivessel CABG, found to have acute on the embolism  Started on Heparin per Pharmacy, started coumadin 4/16 received 7.5 mg x2 doses, INR jumped from 1.29 to 3.01  - Bilateral LE venous Dopplers negative for DVT.  - TTE , RV function as normal but no TR to evaluate PAP.  Patient will be discharged home on Coumadin with home health RN to monitor his INR results to primary care provider , with lovenox for a 5 day overlap  CARDIOVASCULAR  A: CAD s/p CABG on 3/14.  - Continue ASA, Lipitor, Digoxin, Metoprolol PO.    rhabdomyolysis. -Total CK was 16109  Resolved    Anemia. The hemoglobin of around 10  Stable for the last couple of days     ENDOCRINE  A: DM 2. Hyperglycemia.  Resume home dose of Levemir and sliding scale insulin  .  NEUROLOGIC  A: Acute encephalopathy in setting of multiple psychotropic meds / opioid intoxication. UA + opiods, tricyclics (not on Rx list), benzodiazepins (not on Rx list). Negative head CT.  P:  - Continue Gabapentin, Zoloft and Flexeril but oxycodone discontinued,  - Continue morphine pump.     Leg wound s/p bedside debridement , treated with vanc during this admit, switched to PO doxycycline for 10 days     Discharge Exam: *  Blood pressure 90/54, pulse 74, temperature 97.6 F (36.4 C), temperature source Oral, resp. rate 18, height 5\' 10"  (1.778 m), weight 96.5 kg (212 lb 11.9 oz), SpO2 98.00%.  General: Alert,  larthargic  Neuro: Sleepy but arouses to stimulation, pinpoint pupils, protects airway  HEENT: No JVD  Cardiovascular: RRR, no murmurs  Lungs: CTAB, no crackles, no wheezes  Abdomen: RLQ pump subQ, soft, nt, nd  Musculoskeletal: intact  Skin: Right LE +1, no left LE edema         Future Appointments  Provider  Department  Dept Phone    01/02/2013 2:00 PM  Kerin Perna, MD  [facility] [place]  [phone]

## 2012-12-15 NOTE — Progress Notes (Addendum)
ANTICOAGULATION CONSULT NOTE - Follow Up Consult ABX Follow up note Pharmacy Consult for Heparin and Coumadin / Vancomycin Indication: pulmonary embolus / LE cellulitis   No Known Allergies  Patient Measurements: Height: 5\' 10"  (177.8 cm) Weight: 213 lb 6.5 oz (96.8 kg) IBW/kg (Calculated) : 73 Heparin Dosing Weight: 92.8kg  Vital Signs: Temp: 97.8 F (36.6 C) (04/19 0613) Temp src: Oral (04/19 0613) BP: 98/60 mmHg (04/19 0700) Pulse Rate: 89 (04/19 0613)  Labs:  Recent Labs  12/13/12 0500 12/14/12 0653 12/15/12 0635  HGB 9.5*  --   --   HCT 28.1*  --   --   PLT 222  --   --   LABPROT 15.8* 28.1* 29.6*  INR 1.29 2.80* 3.01*  HEPARINUNFRC 0.52 0.33 0.27*  CREATININE 0.64  --   --     Estimated Creatinine Clearance: 113.2 ml/min (by C-G formula based on Cr of 0.64).   Medications:  Heparin 2050 units/hr  Assessment: 62 yo M s/p CABG 12/03/12 was found down at home by family members thought 2/2 oversedation with multiple pain meds/psychotropics.  CT scan at OSH showed small BL PE's and pt was initiated on heparin and coumadin.  Heparin level 0.27 slightly less than goal on 2050 units/hr.  INR 1.29> 2.8>3.01 after 2 doses Coumadin 7.5mg .  Quick jump.  4/17 cbc stable, no bleeding noted.  Today is day#3/5 minimum overlap IV:PO required for treatment of PE. Noted pt wants to go home - he will need Lovenox 100mg  sq q12 x3 more days if he discharges today.  Will change heparin drip to Lovenox so the nurse can teach him how to self inject.   Vancomycin 1gm q12 day # 4 - cellulitis LE post CABG.  Wound improved post debriedment.  Vancomycin trough 10 at goal 10-15.  Continue current dose with plans to change to po ABX at d/c. Goal of Therapy:  INR 2-3 Heparin level 0.3-0.7 units/ml Monitor platelets by anticoagulation protocol: Yes   Plan:  1. D/C heparin drip  2.  lovenox 95mg  sq x1 dose 2. Hold Coumadin today given big jump - would restart Coumadin 5mg  daily 4/20 and have  close INR follow up 3. Daily INR, CBC and heparin level 4. Coumadin educational book/video - complete  Leota Sauers Pharm.D. CPP, BCPS Clinical Pharmacist [phone] 12/15/2012 10:48 AM

## 2012-12-18 ENCOUNTER — Encounter (HOSPITAL_COMMUNITY): Payer: Self-pay | Admitting: General Practice

## 2012-12-18 ENCOUNTER — Inpatient Hospital Stay (HOSPITAL_COMMUNITY)
Admission: EM | Admit: 2012-12-18 | Discharge: 2012-12-23 | DRG: 238 | Disposition: A | Discharge status: Home Health | Payer: Non-veteran care | Source: Other Acute Inpatient Hospital | Attending: Cardiothoracic Surgery | Admitting: Cardiothoracic Surgery

## 2012-12-18 ENCOUNTER — Inpatient Hospital Stay (HOSPITAL_COMMUNITY): Payer: Non-veteran care

## 2012-12-18 DIAGNOSIS — F17200 Nicotine dependence, unspecified, uncomplicated: Secondary | ICD-10-CM | POA: Diagnosis present

## 2012-12-18 DIAGNOSIS — E860 Dehydration: Secondary | ICD-10-CM | POA: Diagnosis present

## 2012-12-18 DIAGNOSIS — E119 Type 2 diabetes mellitus without complications: Secondary | ICD-10-CM | POA: Diagnosis present

## 2012-12-18 DIAGNOSIS — I10 Essential (primary) hypertension: Secondary | ICD-10-CM | POA: Diagnosis present

## 2012-12-18 DIAGNOSIS — I314 Cardiac tamponade: Secondary | ICD-10-CM | POA: Diagnosis present

## 2012-12-18 DIAGNOSIS — T45511A Poisoning by anticoagulants, accidental (unintentional), initial encounter: Secondary | ICD-10-CM

## 2012-12-18 DIAGNOSIS — D62 Acute posthemorrhagic anemia: Secondary | ICD-10-CM | POA: Diagnosis not present

## 2012-12-18 DIAGNOSIS — M549 Dorsalgia, unspecified: Secondary | ICD-10-CM | POA: Diagnosis present

## 2012-12-18 DIAGNOSIS — Z794 Long term (current) use of insulin: Secondary | ICD-10-CM

## 2012-12-18 DIAGNOSIS — Z7739 Contact with and (suspected) exposure to other war theater: Secondary | ICD-10-CM | POA: Diagnosis present

## 2012-12-18 DIAGNOSIS — R55 Syncope and collapse: Secondary | ICD-10-CM | POA: Diagnosis present

## 2012-12-18 DIAGNOSIS — Z8249 Family history of ischemic heart disease and other diseases of the circulatory system: Secondary | ICD-10-CM

## 2012-12-18 DIAGNOSIS — Z9861 Coronary angioplasty status: Secondary | ICD-10-CM

## 2012-12-18 DIAGNOSIS — I312 Hemopericardium, not elsewhere classified: Principal | ICD-10-CM | POA: Diagnosis present

## 2012-12-18 DIAGNOSIS — Z86711 Personal history of pulmonary embolism: Secondary | ICD-10-CM

## 2012-12-18 DIAGNOSIS — T458X1A Poisoning by other primarily systemic and hematological agents, accidental (unintentional), initial encounter: Secondary | ICD-10-CM

## 2012-12-18 DIAGNOSIS — J438 Other emphysema: Secondary | ICD-10-CM | POA: Diagnosis present

## 2012-12-18 DIAGNOSIS — Z77098 Contact with and (suspected) exposure to other hazardous, chiefly nonmedicinal, chemicals: Secondary | ICD-10-CM | POA: Diagnosis present

## 2012-12-18 DIAGNOSIS — I5189 Other ill-defined heart diseases: Secondary | ICD-10-CM | POA: Diagnosis present

## 2012-12-18 DIAGNOSIS — Z9189 Other specified personal risk factors, not elsewhere classified: Secondary | ICD-10-CM

## 2012-12-18 DIAGNOSIS — G8929 Other chronic pain: Secondary | ICD-10-CM | POA: Diagnosis present

## 2012-12-18 DIAGNOSIS — I252 Old myocardial infarction: Secondary | ICD-10-CM

## 2012-12-18 DIAGNOSIS — T45515A Adverse effect of anticoagulants, initial encounter: Secondary | ICD-10-CM

## 2012-12-18 DIAGNOSIS — K219 Gastro-esophageal reflux disease without esophagitis: Secondary | ICD-10-CM | POA: Diagnosis present

## 2012-12-18 DIAGNOSIS — E785 Hyperlipidemia, unspecified: Secondary | ICD-10-CM | POA: Diagnosis present

## 2012-12-18 DIAGNOSIS — I2510 Atherosclerotic heart disease of native coronary artery without angina pectoris: Secondary | ICD-10-CM | POA: Diagnosis present

## 2012-12-18 DIAGNOSIS — Z79899 Other long term (current) drug therapy: Secondary | ICD-10-CM

## 2012-12-18 DIAGNOSIS — E669 Obesity, unspecified: Secondary | ICD-10-CM | POA: Diagnosis present

## 2012-12-18 DIAGNOSIS — D6832 Hemorrhagic disorder due to extrinsic circulating anticoagulants: Secondary | ICD-10-CM

## 2012-12-18 DIAGNOSIS — Z7901 Long term (current) use of anticoagulants: Secondary | ICD-10-CM

## 2012-12-18 DIAGNOSIS — Z7982 Long term (current) use of aspirin: Secondary | ICD-10-CM

## 2012-12-18 DIAGNOSIS — Z951 Presence of aortocoronary bypass graft: Secondary | ICD-10-CM

## 2012-12-18 DIAGNOSIS — E8779 Other fluid overload: Secondary | ICD-10-CM | POA: Diagnosis present

## 2012-12-18 DIAGNOSIS — I2699 Other pulmonary embolism without acute cor pulmonale: Secondary | ICD-10-CM | POA: Diagnosis present

## 2012-12-18 DIAGNOSIS — I309 Acute pericarditis, unspecified: Secondary | ICD-10-CM

## 2012-12-18 HISTORY — DX: Chronic obstructive pulmonary disease, unspecified: J44.9

## 2012-12-18 HISTORY — DX: Depression, unspecified: F32.A

## 2012-12-18 HISTORY — DX: Major depressive disorder, single episode, unspecified: F32.9

## 2012-12-18 HISTORY — DX: Shortness of breath: R06.02

## 2012-12-18 LAB — GLUCOSE, CAPILLARY
Glucose-Capillary: 170 mg/dL — ABNORMAL HIGH (ref 70–99)
Glucose-Capillary: 189 mg/dL — ABNORMAL HIGH (ref 70–99)

## 2012-12-18 LAB — CBC
HCT: 28.5 % — ABNORMAL LOW (ref 39.0–52.0)
MCH: 32.5 pg (ref 26.0–34.0)
MCV: 96.6 fL (ref 78.0–100.0)
RDW: 14.6 % (ref 11.5–15.5)
WBC: 13 10*3/uL — ABNORMAL HIGH (ref 4.0–10.5)

## 2012-12-18 LAB — HEMOGLOBIN A1C: Mean Plasma Glucose: 194 mg/dL — ABNORMAL HIGH (ref <117)

## 2012-12-18 LAB — APTT: aPTT: 56 seconds — ABNORMAL HIGH (ref 24–37)

## 2012-12-18 LAB — BASIC METABOLIC PANEL
BUN: 34 mg/dL — ABNORMAL HIGH (ref 6–23)
CO2: 23 mEq/L (ref 19–32)
Calcium: 9.1 mg/dL (ref 8.4–10.5)
Chloride: 98 mEq/L (ref 96–112)
Creatinine, Ser: 0.88 mg/dL (ref 0.50–1.35)
Glucose, Bld: 192 mg/dL — ABNORMAL HIGH (ref 70–99)

## 2012-12-18 LAB — CK TOTAL AND CKMB (NOT AT ARMC)
CK, MB: 3.2 ng/mL (ref 0.3–4.0)
Relative Index: INVALID (ref 0.0–2.5)
Total CK: 49 U/L (ref 7–232)
Total CK: 71 U/L (ref 7–232)

## 2012-12-18 LAB — PREPARE RBC (CROSSMATCH)

## 2012-12-18 LAB — PROTIME-INR: Prothrombin Time: 17.3 seconds — ABNORMAL HIGH (ref 11.6–15.2)

## 2012-12-18 MED ORDER — HEPARIN (PORCINE) IN NACL 100-0.45 UNIT/ML-% IJ SOLN
2050.0000 [IU]/h | INTRAMUSCULAR | Status: DC
  Administered 2012-12-18: 2050 [IU]/h via INTRAVENOUS
  Filled 2012-12-18 (×2): qty 250

## 2012-12-18 MED ORDER — ACETAMINOPHEN 650 MG RE SUPP: 650.0000 mg | Freq: Four times a day (QID) | RECTAL | Status: DC | PRN

## 2012-12-18 MED ORDER — LEVALBUTEROL TARTRATE 45 MCG/ACT IN AERO
2.0000 | INHALATION_SPRAY | Freq: Four times a day (QID) | RESPIRATORY_TRACT | Status: DC
  Administered 2012-12-18 – 2012-12-20 (×5): 2 via RESPIRATORY_TRACT
  Filled 2012-12-18: qty 15

## 2012-12-18 MED ORDER — HYDROMORPHONE HCL PF 1 MG/ML IJ SOLN
1.0000 mg | INTRAMUSCULAR | Status: DC | PRN
  Administered 2012-12-19: 1 mg via INTRAVENOUS
  Filled 2012-12-18: qty 1

## 2012-12-18 MED ORDER — HYDROCODONE-ACETAMINOPHEN 5-325 MG PO TABS: 1.0000 | ORAL_TABLET | Freq: Four times a day (QID) | ORAL | Status: DC | PRN

## 2012-12-18 MED ORDER — SODIUM CHLORIDE 0.9 % IV SOLN
INTRAVENOUS | Status: DC
  Administered 2012-12-18: 11:00:00 via INTRAVENOUS

## 2012-12-18 MED ORDER — INSULIN DETEMIR 100 UNIT/ML ~~LOC~~ SOLN
45.0000 [IU] | Freq: Every day | SUBCUTANEOUS | Status: DC
  Administered 2012-12-18: 45 [IU] via SUBCUTANEOUS
  Filled 2012-12-18 (×2): qty 0.45

## 2012-12-18 MED ORDER — INSULIN DETEMIR 100 UNIT/ML ~~LOC~~ SOLN
60.0000 [IU] | Freq: Every morning | SUBCUTANEOUS | Status: DC
  Filled 2012-12-18: qty 0.6

## 2012-12-18 MED ORDER — DEXTROSE 5 % IV SOLN
1.5000 g | INTRAVENOUS | Status: AC
  Administered 2012-12-19: 1.5 g via INTRAVENOUS
  Filled 2012-12-18: qty 1.5

## 2012-12-18 MED ORDER — ALUM & MAG HYDROXIDE-SIMETH 200-200-20 MG/5ML PO SUSP: 30.0000 mL | Freq: Four times a day (QID) | ORAL | Status: DC | PRN

## 2012-12-18 MED ORDER — BUDESONIDE-FORMOTEROL FUMARATE 160-4.5 MCG/ACT IN AERO
2.0000 | INHALATION_SPRAY | Freq: Two times a day (BID) | RESPIRATORY_TRACT | Status: DC
  Administered 2012-12-18 – 2012-12-22 (×9): 2 via RESPIRATORY_TRACT
  Filled 2012-12-18: qty 6

## 2012-12-18 MED ORDER — INSULIN DETEMIR 100 UNIT/ML ~~LOC~~ SOLN
45.0000 [IU] | Freq: Every day | SUBCUTANEOUS | Status: DC
  Filled 2012-12-18: qty 0.45

## 2012-12-18 MED ORDER — SODIUM CHLORIDE 0.9 % IV SOLN
INTRAVENOUS | Status: DC
  Filled 2012-12-18: qty 1

## 2012-12-18 MED ORDER — CYCLOBENZAPRINE HCL 10 MG PO TABS
10.0000 mg | ORAL_TABLET | Freq: Every day | ORAL | Status: DC
  Filled 2012-12-18: qty 1

## 2012-12-18 MED ORDER — DIGOXIN 250 MCG PO TABS
0.2500 mg | ORAL_TABLET | Freq: Every day | ORAL | Status: DC
  Administered 2012-12-18 – 2012-12-23 (×5): 0.25 mg via ORAL
  Filled 2012-12-18 (×6): qty 1

## 2012-12-18 MED ORDER — SERTRALINE HCL 50 MG PO TABS
50.0000 mg | ORAL_TABLET | Freq: Every day | ORAL | Status: DC
  Administered 2012-12-18 – 2012-12-23 (×5): 50 mg via ORAL
  Filled 2012-12-18 (×6): qty 1

## 2012-12-18 MED ORDER — GABAPENTIN 800 MG PO TABS
800.0000 mg | ORAL_TABLET | Freq: Three times a day (TID) | ORAL | Status: DC
  Filled 2012-12-18 (×3): qty 1

## 2012-12-18 MED ORDER — INSULIN DETEMIR 100 UNIT/ML ~~LOC~~ SOLN: 45.0000 [IU] | Freq: Two times a day (BID) | SUBCUTANEOUS | Status: DC

## 2012-12-18 MED ORDER — CYCLOBENZAPRINE HCL 5 MG PO TABS
5.0000 mg | ORAL_TABLET | Freq: Every day | ORAL | Status: DC
  Administered 2012-12-18: 5 mg via ORAL
  Filled 2012-12-18 (×2): qty 1

## 2012-12-18 MED ORDER — WARFARIN SODIUM 5 MG PO TABS
5.0000 mg | ORAL_TABLET | Freq: Once | ORAL | Status: DC
  Filled 2012-12-18: qty 1

## 2012-12-18 MED ORDER — ATORVASTATIN CALCIUM 20 MG PO TABS
20.0000 mg | ORAL_TABLET | Freq: Every day | ORAL | Status: DC
  Administered 2012-12-18 – 2012-12-22 (×5): 20 mg via ORAL
  Filled 2012-12-18 (×6): qty 1

## 2012-12-18 MED ORDER — ASPIRIN EC 81 MG PO TBEC
81.0000 mg | DELAYED_RELEASE_TABLET | Freq: Every day | ORAL | Status: DC
  Administered 2012-12-18: 81 mg via ORAL
  Filled 2012-12-18 (×2): qty 1

## 2012-12-18 MED ORDER — INSULIN ASPART 100 UNIT/ML ~~LOC~~ SOLN
0.0000 [IU] | SUBCUTANEOUS | Status: DC
  Administered 2012-12-18: 4 [IU] via SUBCUTANEOUS
  Administered 2012-12-19: 2 [IU] via SUBCUTANEOUS

## 2012-12-18 MED ORDER — INSULIN ASPART 100 UNIT/ML ~~LOC~~ SOLN
0.0000 [IU] | Freq: Three times a day (TID) | SUBCUTANEOUS | Status: DC
  Administered 2012-12-18: 3 [IU] via SUBCUTANEOUS

## 2012-12-18 MED ORDER — ONDANSETRON HCL 4 MG/2ML IJ SOLN: 4.0000 mg | Freq: Four times a day (QID) | INTRAMUSCULAR | Status: DC | PRN

## 2012-12-18 MED ORDER — CYCLOBENZAPRINE HCL 10 MG PO TABS
10.0000 mg | ORAL_TABLET | Freq: Three times a day (TID) | ORAL | Status: DC | PRN
  Filled 2012-12-18: qty 1

## 2012-12-18 MED ORDER — INSULIN DETEMIR 100 UNIT/ML ~~LOC~~ SOLN
60.0000 [IU] | Freq: Every day | SUBCUTANEOUS | Status: DC
  Administered 2012-12-18: 60 [IU] via SUBCUTANEOUS
  Filled 2012-12-18 (×3): qty 0.6

## 2012-12-18 MED ORDER — ONDANSETRON HCL 4 MG PO TABS: 4.0000 mg | ORAL_TABLET | Freq: Four times a day (QID) | ORAL | Status: DC | PRN

## 2012-12-18 MED ORDER — GABAPENTIN 400 MG PO CAPS
800.0000 mg | ORAL_CAPSULE | Freq: Three times a day (TID) | ORAL | Status: DC
  Administered 2012-12-18 – 2012-12-23 (×14): 800 mg via ORAL
  Filled 2012-12-18 (×18): qty 2

## 2012-12-18 MED ORDER — WARFARIN - PHARMACIST DOSING INPATIENT: Freq: Every day | Status: DC

## 2012-12-18 MED ORDER — ACETAMINOPHEN 325 MG PO TABS
650.0000 mg | ORAL_TABLET | Freq: Four times a day (QID) | ORAL | Status: DC | PRN
  Filled 2012-12-18: qty 2

## 2012-12-18 MED ORDER — DOXYCYCLINE HYCLATE 100 MG PO TABS
100.0000 mg | ORAL_TABLET | Freq: Two times a day (BID) | ORAL | Status: DC
  Administered 2012-12-18 (×2): 100 mg via ORAL
  Filled 2012-12-18 (×4): qty 1

## 2012-12-18 MED ORDER — DOCUSATE SODIUM 100 MG PO CAPS
200.0000 mg | ORAL_CAPSULE | Freq: Two times a day (BID) | ORAL | Status: DC | PRN
  Filled 2012-12-18: qty 2

## 2012-12-18 MED ORDER — INSULIN ASPART 100 UNIT/ML ~~LOC~~ SOLN: 0.0000 [IU] | Freq: Every day | SUBCUTANEOUS | Status: DC

## 2012-12-18 NOTE — Progress Notes (Signed)
ANTICOAGULATION CONSULT NOTE - Initial Consult  Pharmacy Consult for Heparin and Coumadin Indication: pulmonary embolus (diagnosed 12/10/12)  No Known Allergies  Patient Measurements: Height: 5\' 10"  (177.8 cm) Weight: 218 lb 1.6 oz (98.93 kg) IBW/kg (Calculated) : 73 Heparin Dosing Weight: 93 kg  Vital Signs: Temp: 97.5 F (36.4 C) (04/22 1200) Temp src: Oral (04/22 1200) BP: 114/77 mmHg (04/22 1200) Pulse Rate: 101 (04/22 1200)  Labs:  Recent Labs  12/18/12 0948 12/18/12 1106  HGB  --  9.6*  HCT  --  28.5*  PLT  --  267  LABPROT  --  17.3*  INR  --  1.46  CREATININE  --  0.88  CKTOTAL 49  --   CKMB 3.3  --   TROPONINI <0.30  --     Estimated Creatinine Clearance: 104 ml/min (by C-G formula based on Cr of 0.88).   Medical History: Past Medical History  Diagnosis Date  . Hypertension   . Myocardial infarction   . Diabetes mellitus without complication   . Hyperlipidemia   . CAD (coronary artery disease), native coronary artery; h/o BMS with redo PTCA (in 2001) of prox RCA; and PTCA followed by DES (Taxus 3.0 x 16 mm) to Circumflex (2004) 11/28/2012  . Tobacco abuse 11/29/2012  . BACK PAIN, CHRONIC 02/08/2008    Qualifier: Diagnosis of  By: Koleen Distance CMA (AAMA), Hulan Saas    . Depression   . COPD (chronic obstructive pulmonary disease)   . Shortness of breath     Medications:  Prescriptions prior to admission  Medication Sig Dispense Refill  . aspirin EC 81 MG EC tablet Take 1 tablet (81 mg total) by mouth daily.  30 tablet  2  . atorvastatin (LIPITOR) 40 MG tablet Take 20 mg by mouth at bedtime.      . budesonide-formoterol (SYMBICORT) 160-4.5 MCG/ACT inhaler Inhale 2 puffs into the lungs 2 (two) times daily.  1 Inhaler  1  . cyclobenzaprine (FLEXERIL) 10 MG tablet Take 10 mg by mouth 3 (three) times daily as needed for muscle spasms. For mucle spasms      . digoxin (LANOXIN) 0.25 MG tablet Take 1 tablet (0.25 mg total) by mouth daily.  30 tablet  1  . docusate  sodium 100 MG CAPS Take 200 mg by mouth 2 (two) times daily as needed for constipation.  10 capsule    . doxycycline (VIBRAMYCIN) 50 MG capsule Take 2 capsules (100 mg total) by mouth 2 (two) times daily.  30 capsule  0  . enoxaparin (LOVENOX) 150 MG/ML injection Inject 0.64 mLs (95 mg total) into the skin every 12 (twelve) hours.  5 Syringe  0  . gabapentin (NEURONTIN) 800 MG tablet Take 800 mg by mouth 3 (three) times daily.      . insulin aspart (NOVOLOG) 100 UNIT/ML injection Inject 15 Units into the skin 3 (three) times daily before meals.      . insulin detemir (LEVEMIR) 100 UNIT/ML injection Inject 45-60 Units into the skin 2 (two) times daily. Takes 60 units in the am & 45 units in the pm      . levalbuterol (XOPENEX HFA) 45 MCG/ACT inhaler Inhale 2 puffs into the lungs every 6 (six) hours.  1 Inhaler  1  . metoprolol tartrate (LOPRESSOR) 25 MG tablet Take 1.5 tablets (37.5 mg total) by mouth 2 (two) times daily.  90 tablet  1  . sertraline (ZOLOFT) 100 MG tablet Take 50 mg by mouth daily.      Marland Kitchen  warfarin (COUMADIN) 5 MG tablet Take 1 tablet (5 mg total) by mouth daily.  30 tablet  2    Assessment: Dylan Church is a 62 yo M who is known to pharmacy from previous admissions and anticoagulation dosing.  Pt was admitted 4/2-4/11 for CABG, readmitted 4/14-4/19 with new bilateral PEs and started on heparin and warfarin.  At that time the patient seemed sensitive to warfarin doses and had an elevated INR by day #3 of treatment.  Pt was discharged on Lovenox and Coumadin.  On 4/21 the patient presented to [facility] after a syncopal event and was found to have an INR >20.  INR was reversed with 10 mg IV Vitamin K and 2 units FFP.  INR today=1.46.  I suspect the elevated INR >20 was a lab error considering the INR reversal response.  Now that INR is subtherapeutic, MD has asked that pharmacy start heparin and Coumadin for recent PE.  Last admission pt was therapeutic on heparin 2050  units/hr.  Goal of Therapy:  INR 2-3 Heparin level 0.3-0.7 units/ml Monitor platelets by anticoagulation protocol: Yes   Plan:  1. Heparin 2050 units/hr. 2. Coumadin 5 mg PO x 1 tonight. 3. Heparin level in 8 hours (~ 2200) 4. Heparin level, CBC, and INR daily.  [employer], Pharm.D., BCPS Clinical Pharmacist Pager [phone] 12/18/2012 1:18 PM

## 2012-12-18 NOTE — Care Management Note (Signed)
    Page 1 of 2   12/24/2012     3:14:53 PM   CARE MANAGEMENT NOTE 12/24/2012  Patient:  Dylan Church, Dylan Church   Account Number:  [id number]  Date Initiated:  12/18/2012  Documentation initiated by:  GRAVES-BIGELOW,BRENDA  Subjective/Objective Assessment:   Pt transferred from [facility] with syncope. Pt goes to the [facility] and CM called [facility] [place] to make them aware that pt is here. CM left vm on April Alexander's answering machine at ext [phone].     Action/Plan:   CM will continue to monitor for disposition needs. Pt was currently active with [facility] and will need resumption orders for services.   Anticipated DC Date:  12/19/2012   Anticipated DC Plan:  HOME W HOME HEALTH SERVICES      DC Planning Services  CM consult      [facility] Choice  HOME HEALTH  Resumption Of Svcs/PTA Provider   Choice offered to / List presented to:  C-1 Patient        HH arranged  HH-1 RN  HH-10 DISEASE MANAGEMENT  HH-2 PT      [facility] agency  [facility]   Status of service:  Completed, signed off Medicare Important Message given?   (If response is "NO", the following Medicare IM given date fields will be blank) Date Medicare IM given:   Date Additional Medicare IM given:    Discharge Disposition:  HOME W HOME HEALTH SERVICES  Per UR Regulation:  Reviewed for med. necessity/level of care/duration of stay  If discussed at Long Length of Stay Meetings, dates discussed:    Comments:  12/24/12 Rozena Fierro,RN,BSN [phone] MESSAGE LEFT FOR [facility] CASE MANAGER, PER [facility] REPRESENTATIVE  REQUEST.  PT DC ON 4/27, AND [facility] NEEDS TO BE NOTIFIED IN ORDER TO APPROVE RESUMPTION OF HH SERVICES. WILL FOLLOW WITH [facility] CASE MANAGER.  12/23/2012 1000 NCM spoke to [place] and they will follow up pt post discharge for [facility]. [facility] HH information added to dc instructions. Isidoro Donning RN CCM Case Mgmt phone [phone]

## 2012-12-18 NOTE — Progress Notes (Signed)
Procedure(s) (LRB): SUBXYPHOID PERICARDIAL WINDOW (N/A) Subjective: Requested to see patient with postop pericardial effusion and INR of 20 discharged from the hospital after recent CABG, subsequent small bibasilar pulmonary emboli and started on oral Coumadin as well as 125 mg Lovenox twice a day. Patient returned to outside hospital after syncopal event, was found to be over anticoagulated and was transported to this hospital. He has been stable through the day. A 2-D echocardiogram performed this evening demonstrates a moderate to large pericardial effusion with some focal compression of the right heart. A current PTT and INR is pending. Objective: Vital signs in last 24 hours: Temp:  [97.5 F (36.4 C)] 97.5 F (36.4 C) (04/22 1200) Pulse Rate:  [101-102] 101 (04/22 1200) Cardiac Rhythm:  [-] Sinus tachycardia (04/22 0910) Resp:  [18] 18 (04/22 1200) BP: (113-114)/(77) 114/77 mmHg (04/22 1200) SpO2:  [100 %] 100 % (04/22 1254) Weight:  [218 lb 1.6 oz (98.93 kg)] 218 lb 1.6 oz (98.93 kg) (04/22 0904)  Hemodynamic parameters for last 24 hours:   sinus, blood pressure 117/80  Intake/Output from previous day:   Intake/Output this shift:    Exam Patient alert and responsive, appears somewhat pale Sternal incision well-healed Cardiac heart sounds well audible, no murmur no JVD No pulsus paradoxus  Extremities with pulses mild edema Neuro intact  Lab Results:  Recent Labs  12/18/12 1106  WBC 13.0*  HGB 9.6*  HCT 28.5*  PLT 267   BMET:  Recent Labs  12/18/12 1106  NA 134*  K 5.1  CL 98  CO2 23  GLUCOSE 192*  BUN 34*  CREATININE 0.88  CALCIUM 9.1    PT/INR:  Recent Labs  12/18/12 1106  LABPROT 17.3*  INR 1.46   ABG    Component Value Date/Time   PHART 7.407 12/11/2012 0227   HCO3 27.7* 12/11/2012 0227   TCO2 29 12/11/2012 0227   O2SAT 91.0 12/11/2012 0227   CBG (last 3)   Recent Labs  12/18/12 1154 12/18/12 1652  GLUCAP 170* 158*     Assessment/Plan: S/P Procedure(s) (LRB): SUBXYPHOID PERICARDIAL WINDOW (N/A) 2-D echocardiogram reviewed and patient examined. We'll plan subxiphoid pericardial window in a.m. and observe patient in ICU. FFP planned for preoperative elevated PTT. Procedure of subxiphoid window under general anesthesia discussed with patient and family and all questions addressed.   LOS: 0 days    VAN TRIGT III,Zubin Pontillo 12/18/2012

## 2012-12-18 NOTE — Progress Notes (Signed)
T. CTS p.m. Rounds  Resting comfortably Heart rate 90-100 blood pressure 108/70 Receiving FFP for prolonged PTT Plan subxiphoid pericardial window in a.m.

## 2012-12-18 NOTE — Progress Notes (Signed)
  Echocardiogram 2D Echocardiogram (limited) has been performed.  Jorje Guild 12/18/2012, 3:52 PM

## 2012-12-18 NOTE — Progress Notes (Signed)
Subjective:  Resting comfortably, denies chest pain or SOB  Objective:  Vital Signs in the last 24 hours: Temp:  [97.5 F (36.4 C)] 97.5 F (36.4 C) (04/22 1200) Pulse Rate:  [101-102] 101 (04/22 1200) Resp:  [18] 18 (04/22 1200) BP: (113-114)/(77) 114/77 mmHg (04/22 1200) SpO2:  [100 %] 100 % (04/22 1254) Weight:  [98.93 kg (218 lb 1.6 oz)] 98.93 kg (218 lb 1.6 oz) (04/22 0904)  Intake/Output from previous day: No intake or output data in the 24 hours ending 12/18/12 1623  Physical Exam: General appearance: alert, cooperative and no distress Lungs: clear to auscultation bilaterally Heart: regular rate and rhythm and 2/6 short systolic murmur   Rate: 98  Rhythm: normal sinus rhythm  Lab Results:  Recent Labs  12/18/12 1106  WBC 13.0*  HGB 9.6*  PLT 267    Recent Labs  12/18/12 1106  NA 134*  K 5.1  CL 98  CO2 23  GLUCOSE 192*  BUN 34*  CREATININE 0.88    Recent Labs  12/18/12 0948  TROPONINI <0.30   Hepatic Function Panel No results found for this basename: PROT, ALBUMIN, AST, ALT, ALKPHOS, BILITOT, BILIDIR, IBILI,  in the last 72 hours No results found for this basename: CHOL,  in the last 72 hours  Recent Labs  12/18/12 1106  INR 1.46    Imaging: Imaging results have been reviewed  Cardiac Studies: 2D large, loculated pericardial effusion  Assessment/Plan:   Principal Problem:   Syncope Active Problems:   CAD h/o PCI,  progression 11/29/12- CABG - X3 12/03/12   Pulmonary embolism post CABG   Hemorrhagic pericardial effusion   DIABETES MELLITUS   BACK PAIN, CHRONIC- followed at pain clinic, he has an implantable MSO4 pump   Warfarin-induced coagulopathy- reversed   HYPERLIPIDEMIA   HYPERTENSION   Agent orange exposure- on disabilty from. [place] Vet '69-70   Diastolic dysfunction grade 1 with an EF 60-65% 2D 11/30/12  Plan- Heparin stopped. CVTS contacted, Dr Royann Shivers did not feel the effusion could be drained percutaneously, will keep him  NPO for now.    Corine Shelter PA-C 12/18/2012, 4:23 PM

## 2012-12-18 NOTE — H&P (Signed)
History and Physical       Hospital Admission Note Date: 12/18/2012  Patient name: Dylan Church Medical record number: [id number] Date of birth: [birth year] Age: 62 y.o. Gender: male PCP: DOUGH,ROBERT, MD    Chief Complaint:  Syncopal episode yesterday evening  HPI: Patient is a 62 year old male with history of hypertension, hyperlipidemia, diabetes, coronary disease s/p CABG on 12/03/12, postoperative pulmonary embolism which was treated with Coumadin and Lovenox. Patient was discharged home on 12/15/12. Per patient, he did not feel too good on 12/16/12 the next day, felt nauseated and did not eat well due to poor appetite. Yesterday evening around 7 PM patient was going to the kitchen, when he felt dizzy and lightheaded and passed out. He denied any chest pain, shortness of breath palpitations or numbness or tingling or focal weakness prior to the incident. Patient went to [facility] ED where he was found to have systolic blood pressure in 80s-low 90's. INR was over 20 but no bleeding. Patient received vitamin K and 2 units of FFP and was given 1 L of IV fluids. Patient was transferred to [facility] for further workup.    Review of Systems:  Constitutional: Denies fever, chills, diaphoresis + poor appetite and fatigue.  HEENT: Denies photophobia, eye pain, redness, hearing loss, ear pain, congestion, sore throat, rhinorrhea, sneezing, mouth sores, trouble swallowing, neck pain, neck stiffness and tinnitus.   Respiratory: Denies SOB, DOE, cough, chest tightness,  and wheezing.   Cardiovascular: Denies chest pain, palpitations and leg swelling.  Gastrointestinal: Denies vomiting, abdominal pain, diarrhea, constipation, blood in stool and abdominal distention. + nausea Genitourinary: Denies dysuria, urgency, frequency, hematuria, flank pain and difficulty urinating.  Musculoskeletal: Denies myalgias, joint swelling, arthralgias and gait  problem. He has chronic back pain and morphine pump, follows pain clinic (Dr Ninfa Meeker in [place]) Skin: Denies pallor, rash and wound.  patient had groin debridement done at bedside and was placed on antibiotics Neurological: Denies dizziness, seizures, syncope, weakness, light-headedness, numbness and headaches.  Hematological: Denies adenopathy. Easy bruising, personal or family bleeding history  Psychiatric/Behavioral: Denies suicidal ideation, mood changes, confusion, nervousness, sleep disturbance and agitation  Past Medical History: Past Medical History  Diagnosis Date  . Hypertension   . Myocardial infarction   . Diabetes mellitus without complication   . Hyperlipidemia   . CAD (coronary artery disease), native coronary artery; h/o BMS with redo PTCA (in 2001) of prox RCA; and PTCA followed by DES (Taxus 3.0 x 16 mm) to Circumflex (2004) 11/28/2012  . Tobacco abuse 11/29/2012  . BACK PAIN, CHRONIC 02/08/2008    Qualifier: Diagnosis of  By: Koleen Distance CMA Duncan Dull), Hulan Saas     Past Surgical History  Procedure Laterality Date  . Cardiac catheterization    . Coronary angioplasty  2001    BMS to prox RCC in 1990s - Cutting Balloon PTCA in 2001 (2.75 mm)   . Cholecystectomy    . Percutaneous coronary stent intervention (pci-s)  2004    Taxus DES 3.0 mm and 16 mm.  . Coronary artery bypass graft N/A 12/03/2012    Procedure: CORONARY ARTERY BYPASS GRAFTING (CABG);  Surgeon: Kerin Perna, MD;  Location: [facility] OR;  Service: Open Heart Surgery;  Laterality: N/A;  . Intraoperative transesophageal echocardiogram N/A 12/03/2012    Procedure: INTRAOPERATIVE TRANSESOPHAGEAL ECHOCARDIOGRAM;  Surgeon: Kerin Perna, MD;  Location: [facility] OR;  Service: Open Heart Surgery;  Laterality: N/A;    Medications: Prior to Admission medications   Medication Sig Start Date End  Date Taking? Authorizing Provider  aspirin EC 81 MG EC tablet Take 1 tablet (81 mg total) by mouth daily. 12/14/12   Richarda Overlie, MD  atorvastatin  (LIPITOR) 40 MG tablet Take 20 mg by mouth at bedtime.    Historical Provider, MD  budesonide-formoterol (SYMBICORT) 160-4.5 MCG/ACT inhaler Inhale 2 puffs into the lungs 2 (two) times daily. 12/07/12   Erin Barrett, PA-C  cyclobenzaprine (FLEXERIL) 10 MG tablet Take 10 mg by mouth 3 (three) times daily as needed for muscle spasms. For mucle spasms    Historical Provider, MD  digoxin (LANOXIN) 0.25 MG tablet Take 1 tablet (0.25 mg total) by mouth daily. 12/07/12   Erin Barrett, PA-C  docusate sodium 100 MG CAPS Take 200 mg by mouth 2 (two) times daily as needed for constipation. 12/07/12   Erin Barrett, PA-C  doxycycline (VIBRAMYCIN) 50 MG capsule Take 2 capsules (100 mg total) by mouth 2 (two) times daily. 12/15/12 12/25/12  Richarda Overlie, MD  enoxaparin (LOVENOX) 150 MG/ML injection Inject 0.64 mLs (95 mg total) into the skin every 12 (twelve) hours. 12/14/12   Richarda Overlie, MD  furosemide (LASIX) 40 MG tablet Take 1 tablet (40 mg total) by mouth daily. For 5 Days 12/07/12   Erin Barrett, PA-C  gabapentin (NEURONTIN) 800 MG tablet Take 800 mg by mouth 3 (three) times daily.    Historical Provider, MD  insulin aspart (NOVOLOG) 100 UNIT/ML injection Inject 15 Units into the skin 3 (three) times daily before meals.    Historical Provider, MD  insulin detemir (LEVEMIR) 100 UNIT/ML injection Inject 45-60 Units into the skin 2 (two) times daily. Takes 60 units in the am & 45 units in the pm    Historical Provider, MD  levalbuterol (XOPENEX HFA) 45 MCG/ACT inhaler Inhale 2 puffs into the lungs every 6 (six) hours. 12/07/12   Erin Barrett, PA-C  metoprolol tartrate (LOPRESSOR) 25 MG tablet Take 1.5 tablets (37.5 mg total) by mouth 2 (two) times daily. 12/07/12   Erin Barrett, PA-C  potassium chloride (K-DUR) 10 MEQ tablet Take 1 tablet (10 mEq total) by mouth daily. For 5 Days 12/07/12   Erin Barrett, PA-C  sertraline (ZOLOFT) 100 MG tablet Take 50 mg by mouth daily.    Historical Provider, MD  warfarin (COUMADIN) 5  MG tablet Take 1 tablet (5 mg total) by mouth daily. 12/16/12   Richarda Overlie, MD    Allergies:  No Known Allergies  Social History:  reports that he has been smoking.  He does not have any smokeless tobacco history on file. He reports that he does not drink alcohol. His drug history is not on file. patient currently lives at home with his wife and son, ambulates with a walker  Family History: Family History  Problem Relation Age of Onset  . CAD Sister     Physical Exam: Blood pressure 113/77, pulse 102, resp. rate 18, SpO2 100.00%. General: Alert, awake, oriented x3, in no acute distress. HEENT: normocephalic, atraumatic, anicteric sclera, pink conjunctiva, pupils equal and reactive to light and accomodation, oropharynx clear Neck: supple, no masses or lymphadenopathy, no goiter, no bruits  Heart: Regular rate and rhythm, without murmurs, rubs or gallops. Lungs/chest: Clear to auscultation bilaterally, no wheezing, rales or rhonchi. Dressing intact Abdomen: Soft, nontender, nondistended, positive bowel sounds, no masses. Extremities: No clubbing, cyanosis or edema with positive pedal pulses. Neuro: Grossly intact, no focal neurological deficits, strength 5/5 upper and lower extremities bilaterally Psych: alert and oriented x 3, normal mood and  affect Skin: no rashes or lesions, warm and dry, left groin wound healing well, no discharge or pus   LABS on Admission:  Basic Metabolic Panel:  Recent Labs Lab 12/12/12 0420 12/13/12 0500  NA 139 137  K 3.9 3.8  CL 103 101  CO2 28 29  GLUCOSE 117* 136*  BUN 8 9  CREATININE 0.67 0.64  CALCIUM 8.7 8.9  MG 2.2 2.1  PHOS 4.0 3.8   Liver Function Tests:  Recent Labs Lab 12/13/12 0500  AST 133*  ALT 59*  ALKPHOS 93  BILITOT 0.2*  PROT 6.3  ALBUMIN 2.3*   CBC:  Recent Labs Lab 12/12/12 0420 12/13/12 0500  WBC 6.7 6.4  HGB 9.4* 9.5*  HCT 28.2* 28.1*  MCV 96.2 94.9  PLT 220 222     Labs ordered today are  pending  Assessment/Plan Principal Problem:   Syncope: Likely secondary to dehydration, patient was orthostatic, has not been eating well in the last 2 days. Recent CABG on 12/03/12. Orthostatic positive at [facility] ED.  - Admit to telemetry, serial cardiac enzymes, I will hold off on Lasix, Lopressor - Gently hydrate, cardiology consultation called. - Patient recently had an echo done on 12/11/2012, EF 75%, grade 1 diastolic dysfunction - Patient was also noted to have leukocytosis WBC 16k, but afebrile, will obtain blood culture and urine cultures and repeat CBC  Active Problems:   DIABETES MELLITUS - Placed on Carb modified diet, sliding scale insulin, and levemir    HYPERLIPIDEMIA - Continue Lipitor     HYPERTENSION- currently hypotensive, hold antihypertensives, Lasix    BACK PAIN, CHRONIC- followed at pain clinic, he has an implantable MSO4 pump    CAD h/o PCI,  progression 11/29/12- CABG - X3 12/03/12 - Cardiology consultation called, continue aspirin, Lipitor, hold antihypertensives    Pulmonary embolism with  Warfarin-induced coagulopathy: No overt bleeding - Recheck INR, patient received 2 units of FFP and vitamin K at [facility] ED, Coumadin per pharmacy    Left groin wound: s/p bedside debridement during the previous admission - cont doxycycline for 7 days  DVT prophylaxis: INR supratherapeutic, Coumadin per pharmacy  CODE STATUS: Full Code  Further plan will depend as patient's clinical course evolves and further radiologic and laboratory data become available.   Time Spent on Admission: 1 hour  Orman Matsumura M.D. [facility] 12/18/2012, 10:37 AM Pager: [phone]  If 7PM-7AM, please contact night-coverage [email or web address] Password [email or web address]

## 2012-12-18 NOTE — Consult Note (Signed)
Reason for Consult: Syncope Referring Physician: Dr. Andres Labrum, [facility]  HPI: Dylan Church is a 62 y/o white male with a known history of CAD, COPD, Uncontrolled DM. In 2001, he underwent stenting to both his circumflex and RCA by Dr. Elsie Lincoln. He was recently hospitalized earlier this month for unstable angina. He was cathed by Dr. Herbie Baltimore on 11/29/12, which showed a preserved EF and severe multivessel CAD. He subsequently underwent CABG x 3 on 12/03/12. He received a LIMA to LAD, SVG to OM1 and SVG to PLVB. He was discharge home, then came back to [facility] ~ 2 days later with complaints of SOB and dizziness. Work-up revealed a PE. He was treated with Coumadin with heparin bridge. He was discharged again on 4/19. He presented back to the hospital today after sustaining a syncopal episode last night. He reports that this has never happened before. He was walking to his bathroom when he suddenly collapsed and lost consciousness briefly. He reports feeling dizzy and lightheaded right before the episode. He denied any chest pain. He had mild palpitations and shortness of breath. No diaphoresis. He denies any further syncope/presyncope. He currently feels tired and fatigue. He did not sustain any injuries from the fall. He denies head trauma. He denies any bleeding. No hematochezia, melena, hematuria or hematemesis. He notes decreased PO intake and nausea x several days. No diarrhea or vomiting. He reports compliance with his home medications. He checks his blood glucose levels at home daily and denies any hypoglycemia. He denies any weakness, numbness or changes in vision or speech. The patient initially presented to [facility] for evaluation. He apparently was hypotensive with systolic blood pressures in the 80s-low 90s. His INR was over 20.  He was given vitamin K, 2 units of FFP and 1 L of IV fluids. He was transferred to [facility] for further workup.     Past Medical History  Diagnosis Date  . Hypertension   . Myocardial  infarction   . Diabetes mellitus without complication   . Hyperlipidemia   . CAD (coronary artery disease), native coronary artery; h/o BMS with redo PTCA (in 2001) of prox RCA; and PTCA followed by DES (Taxus 3.0 x 16 mm) to Circumflex (2004) 11/28/2012  . Tobacco abuse 11/29/2012  . BACK PAIN, CHRONIC 02/08/2008    Qualifier: Diagnosis of  By: Koleen Distance CMA Duncan Dull), Dylan Church      Past Surgical History  Procedure Laterality Date  . Cardiac catheterization    . Coronary angioplasty  2001    BMS to prox RCC in 1990s - Cutting Balloon PTCA in 2001 (2.75 mm)   . Cholecystectomy    . Percutaneous coronary stent intervention (pci-s)  2004    Taxus DES 3.0 mm and 16 mm.  . Coronary artery bypass graft N/A 12/03/2012    Procedure: CORONARY ARTERY BYPASS GRAFTING (CABG);  Surgeon: Kerin Perna, MD;  Location: [facility] OR;  Service: Open Heart Surgery;  Laterality: N/A;  . Intraoperative transesophageal echocardiogram N/A 12/03/2012    Procedure: INTRAOPERATIVE TRANSESOPHAGEAL ECHOCARDIOGRAM;  Surgeon: Kerin Perna, MD;  Location: [facility] OR;  Service: Open Heart Surgery;  Laterality: N/A;    Family History  Problem Relation Age of Onset  . CAD Sister     Social History:  reports that he has been smoking.  He does not have any smokeless tobacco history on file. He reports that he does not drink alcohol. His drug history is not on file.  Allergies: No Known Allergies  Medications:  Prior  to Admission:  Prescriptions prior to admission  Medication Sig Dispense Refill  . aspirin EC 81 MG EC tablet Take 1 tablet (81 mg total) by mouth daily.  30 tablet  2  . atorvastatin (LIPITOR) 40 MG tablet Take 20 mg by mouth at bedtime.      . budesonide-formoterol (SYMBICORT) 160-4.5 MCG/ACT inhaler Inhale 2 puffs into the lungs 2 (two) times daily.  1 Inhaler  1  . cyclobenzaprine (FLEXERIL) 10 MG tablet Take 10 mg by mouth 3 (three) times daily as needed for muscle spasms. For mucle spasms      . digoxin (LANOXIN)  0.25 MG tablet Take 1 tablet (0.25 mg total) by mouth daily.  30 tablet  1  . docusate sodium 100 MG CAPS Take 200 mg by mouth 2 (two) times daily as needed for constipation.  10 capsule    . doxycycline (VIBRAMYCIN) 50 MG capsule Take 2 capsules (100 mg total) by mouth 2 (two) times daily.  30 capsule  0  . enoxaparin (LOVENOX) 150 MG/ML injection Inject 0.64 mLs (95 mg total) into the skin every 12 (twelve) hours.  5 Syringe  0  . furosemide (LASIX) 40 MG tablet Take 1 tablet (40 mg total) by mouth daily. For 5 Days  5 tablet  0  . gabapentin (NEURONTIN) 800 MG tablet Take 800 mg by mouth 3 (three) times daily.      . insulin aspart (NOVOLOG) 100 UNIT/ML injection Inject 15 Units into the skin 3 (three) times daily before meals.      . insulin detemir (LEVEMIR) 100 UNIT/ML injection Inject 45-60 Units into the skin 2 (two) times daily. Takes 60 units in the am & 45 units in the pm      . levalbuterol (XOPENEX HFA) 45 MCG/ACT inhaler Inhale 2 puffs into the lungs every 6 (six) hours.  1 Inhaler  1  . metoprolol tartrate (LOPRESSOR) 25 MG tablet Take 1.5 tablets (37.5 mg total) by mouth 2 (two) times daily.  90 tablet  1  . potassium chloride (K-DUR) 10 MEQ tablet Take 1 tablet (10 mEq total) by mouth daily. For 5 Days  5 tablet  0  . sertraline (ZOLOFT) 100 MG tablet Take 50 mg by mouth daily.      Marland Kitchen warfarin (COUMADIN) 5 MG tablet Take 1 tablet (5 mg total) by mouth daily.  30 tablet  2    No results found for this or any previous visit (from the past 48 hour(s)).  No results found.  Review of Systems  Constitutional: Positive for malaise/fatigue. Negative for fever, chills and diaphoresis.  Respiratory: Positive for shortness of breath (mild). Negative for hemoptysis.   Cardiovascular: Positive for palpitations and leg swelling. Negative for chest pain, orthopnea, claudication and PND.  Gastrointestinal: Positive for nausea. Negative for vomiting, diarrhea, blood in stool and melena.   Genitourinary: Negative for dysuria and hematuria.  Musculoskeletal: Positive for back pain (chronic low back pain) and falls (x 1).  Neurological: Positive for dizziness and loss of consciousness.   Blood pressure 113/77, pulse 102, resp. rate 18, SpO2 100.00%. Physical Exam  Constitutional: He is oriented to person, place, and time. He appears well-developed and well-nourished. No distress.  HENT:  Head: Normocephalic and atraumatic.  Eyes: Conjunctivae and EOM are normal. Pupils are equal, round, and reactive to light.  Neck: Normal range of motion. Neck supple. No JVD present. Carotid bruit is not present. No tracheal deviation present.  Cardiovascular: Regular rhythm and intact distal  pulses.  Tachycardia present.  Exam reveals no gallop and no friction rub.   Murmur (2/6 systolic murmur) heard. Pulses:      Radial pulses are 2+ on the right side, and 2+ on the left side.       Dorsalis pedis pulses are 1+ on the right side, and 1+ on the left side.  Respiratory: Effort normal. No respiratory distress. He has no wheezes. He has rales in the right lower field.  GI: Soft. Bowel sounds are normal. He exhibits no distension and no mass. There is no tenderness.  Musculoskeletal: He exhibits edema (bilateral 1+ lower extremity edema).  Lymphadenopathy:    He has no cervical adenopathy.  Neurological: He is alert and oriented to person, place, and time.  Skin: Skin is warm and dry. He is not diaphoretic.  Psychiatric: He has a normal mood and affect. His behavior is normal.    Assessment/Plan: Principal Problem:   Syncope Active Problems:   DIABETES MELLITUS   HYPERLIPIDEMIA   HYPERTENSION   BACK PAIN, CHRONIC- followed at pain clinic, he has an implantable MSO4 pump   CAD h/o PCI,  progression 11/29/12- CABG - X3 12/03/12   Pulmonary embolism   Warfarin-induced coagulopathy  Plan: Pt admitted by [facility] for syncope x 1. BP is now stable with most recent being 113/77. He is mildly  tachycardiac with HR in the low 100s. Orthostatics have been ordered. No arrhthymias noted on telemetry. A 12 Lead EKG has been ordered. No chest pain. Most recent echo on 4/14 revealed an EF of 75% and no WMA. Labs are pending. Will need to check CBC for anemia and BMP to check hydration status and electrolytes. Check INR. Will follow-up on labs and EKG when they return. Keep on telemetry to check for arrhythmias. MD to follow with further recommendation.      12/18/2012, 10:21 AM     I have seen and examined the patient along with BRITTAINY M. SIMMONS, PA-C.  I have reviewed the chart, notes and new data.  I agree with PA's note.  Key new complaints: currently his complaints are only fatigue and anorexia. He denies dyspnea, chest pain (musculoskeletal, incisional, angina or pleuritic). He had a formed, brown stool early today. Key examination changes: borderline tachycardic, normal BP after IV fluids. Ecchymosis at Lovenox site R anterior flank Key new findings / data: Hgb is 9.6, unchanged from [facility] labs and consistent with Hgb at last DC from this hospital. INR is actually low at 1.46 (he received 10 mg vit K for a reported INR of 20). ECG shows diffuse nonspecific changes  PLAN: Check limited echo for pericardial effusion (suspicion is not high). IV heparin for recent pulmonary embolism. Continue to monitor for active bleeding, but GI bleeding appears unlikely at this point. If Hgb drops, scan for retroperitoneal hemorrhage, but if Hgb remains steady, this is not necessary.  I an wondering whether INR of 20 was real or an artifactual "red herring".  Thurmon Fair, MD, [facility] [phone] 12/18/2012, 12:40 PM

## 2012-12-19 ENCOUNTER — Encounter (HOSPITAL_COMMUNITY): Payer: Self-pay | Admitting: Certified Registered Nurse Anesthetist

## 2012-12-19 ENCOUNTER — Inpatient Hospital Stay (HOSPITAL_COMMUNITY): Payer: Non-veteran care

## 2012-12-19 ENCOUNTER — Inpatient Hospital Stay (HOSPITAL_COMMUNITY): Payer: Non-veteran care | Admitting: Certified Registered Nurse Anesthetist

## 2012-12-19 ENCOUNTER — Encounter (HOSPITAL_COMMUNITY)
Admission: EM | Disposition: A | Discharge status: Home Health | Payer: Self-pay | Source: Other Acute Inpatient Hospital | Attending: Cardiothoracic Surgery

## 2012-12-19 DIAGNOSIS — I309 Acute pericarditis, unspecified: Secondary | ICD-10-CM

## 2012-12-19 HISTORY — PX: SUBXYPHOID PERICARDIAL WINDOW: SHX5075

## 2012-12-19 LAB — URINALYSIS, ROUTINE W REFLEX MICROSCOPIC
Glucose, UA: NEGATIVE mg/dL
Ketones, ur: NEGATIVE mg/dL
Nitrite: NEGATIVE
Specific Gravity, Urine: 1.026 (ref 1.005–1.030)
pH: 5.5 (ref 5.0–8.0)

## 2012-12-19 LAB — GLUCOSE, CAPILLARY
Glucose-Capillary: 105 mg/dL — ABNORMAL HIGH (ref 70–99)
Glucose-Capillary: 110 mg/dL — ABNORMAL HIGH (ref 70–99)
Glucose-Capillary: 132 mg/dL — ABNORMAL HIGH (ref 70–99)
Glucose-Capillary: 90 mg/dL (ref 70–99)

## 2012-12-19 LAB — BASIC METABOLIC PANEL
BUN: 32 mg/dL — ABNORMAL HIGH (ref 6–23)
CO2: 26 mEq/L (ref 19–32)
Calcium: 9 mg/dL (ref 8.4–10.5)
Creatinine, Ser: 0.87 mg/dL (ref 0.50–1.35)
Glucose, Bld: 93 mg/dL (ref 70–99)
Sodium: 135 mEq/L (ref 135–145)

## 2012-12-19 LAB — PREPARE FRESH FROZEN PLASMA
Unit division: 0
Unit division: 0

## 2012-12-19 LAB — CBC
MCH: 31.9 pg (ref 26.0–34.0)
MCHC: 32.2 g/dL (ref 30.0–36.0)
MCV: 98.9 fL (ref 78.0–100.0)
Platelets: 223 10*3/uL (ref 150–400)
RDW: 14.7 % (ref 11.5–15.5)

## 2012-12-19 LAB — PROTIME-INR
INR: 1.27 (ref 0.00–1.49)
Prothrombin Time: 15.6 seconds — ABNORMAL HIGH (ref 11.6–15.2)

## 2012-12-19 SURGERY — CREATION, PERICARDIAL WINDOW, SUBXIPHOID APPROACH
Anesthesia: General | Wound class: Clean

## 2012-12-19 MED ORDER — FENTANYL CITRATE 0.05 MG/ML IJ SOLN
25.0000 ug | INTRAMUSCULAR | Status: DC | PRN
  Administered 2012-12-19 (×2): 25 ug via INTRAVENOUS

## 2012-12-19 MED ORDER — PANTOPRAZOLE SODIUM 40 MG PO TBEC
40.0000 mg | DELAYED_RELEASE_TABLET | Freq: Every day | ORAL | Status: DC
  Administered 2012-12-20 – 2012-12-23 (×4): 40 mg via ORAL
  Filled 2012-12-19 (×4): qty 1

## 2012-12-19 MED ORDER — METOPROLOL TARTRATE 25 MG PO TABS
37.5000 mg | ORAL_TABLET | Freq: Two times a day (BID) | ORAL | Status: DC
  Filled 2012-12-19 (×4): qty 1

## 2012-12-19 MED ORDER — ETOMIDATE 2 MG/ML IV SOLN
INTRAVENOUS | Status: DC | PRN
  Administered 2012-12-19: 12 mg via INTRAVENOUS

## 2012-12-19 MED ORDER — VANCOMYCIN HCL IN DEXTROSE 1-5 GM/200ML-% IV SOLN
1000.0000 mg | Freq: Once | INTRAVENOUS | Status: AC
  Administered 2012-12-19: 1000 mg via INTRAVENOUS
  Filled 2012-12-19: qty 200

## 2012-12-19 MED ORDER — ROCURONIUM BROMIDE 100 MG/10ML IV SOLN
INTRAVENOUS | Status: DC | PRN
  Administered 2012-12-19: 40 mg via INTRAVENOUS

## 2012-12-19 MED ORDER — OXYCODONE-ACETAMINOPHEN 5-325 MG PO TABS
1.0000 | ORAL_TABLET | ORAL | Status: DC | PRN
  Administered 2012-12-20: 1 via ORAL
  Administered 2012-12-21: 2 via ORAL
  Filled 2012-12-19: qty 2
  Filled 2012-12-19: qty 1
  Filled 2012-12-19: qty 2

## 2012-12-19 MED ORDER — ONDANSETRON HCL 4 MG/2ML IJ SOLN: 4.0000 mg | Freq: Four times a day (QID) | INTRAMUSCULAR | Status: DC | PRN

## 2012-12-19 MED ORDER — INSULIN DETEMIR 100 UNIT/ML ~~LOC~~ SOLN
45.0000 [IU] | Freq: Every day | SUBCUTANEOUS | Status: DC
  Filled 2012-12-19: qty 0.45

## 2012-12-19 MED ORDER — FENTANYL CITRATE 0.05 MG/ML IJ SOLN
INTRAMUSCULAR | Status: DC | PRN
  Administered 2012-12-19: 100 ug via INTRAVENOUS

## 2012-12-19 MED ORDER — ASPIRIN EC 81 MG PO TBEC
81.0000 mg | DELAYED_RELEASE_TABLET | Freq: Every day | ORAL | Status: DC
  Administered 2012-12-20 – 2012-12-23 (×4): 81 mg via ORAL
  Filled 2012-12-19 (×4): qty 1

## 2012-12-19 MED ORDER — INSULIN ASPART 100 UNIT/ML ~~LOC~~ SOLN: 0.0000 [IU] | SUBCUTANEOUS | Status: DC

## 2012-12-19 MED ORDER — LACTATED RINGERS IV SOLN
INTRAVENOUS | Status: DC | PRN
  Administered 2012-12-19: 08:00:00 via INTRAVENOUS

## 2012-12-19 MED ORDER — POTASSIUM CHLORIDE 10 MEQ/50ML IV SOLN: 10.0000 meq | Freq: Every day | INTRAVENOUS | Status: DC | PRN

## 2012-12-19 MED ORDER — NEOSTIGMINE METHYLSULFATE 1 MG/ML IJ SOLN
INTRAMUSCULAR | Status: DC | PRN
  Administered 2012-12-19: 3 mg via INTRAVENOUS

## 2012-12-19 MED ORDER — POTASSIUM CHLORIDE IN NACL 20-0.9 MEQ/L-% IV SOLN
INTRAVENOUS | Status: DC
  Administered 2012-12-19 – 2012-12-20 (×2): via INTRAVENOUS
  Filled 2012-12-19 (×5): qty 1000

## 2012-12-19 MED ORDER — SENNOSIDES-DOCUSATE SODIUM 8.6-50 MG PO TABS
1.0000 | ORAL_TABLET | Freq: Every evening | ORAL | Status: DC | PRN
  Filled 2012-12-19: qty 1

## 2012-12-19 MED ORDER — GLYCOPYRROLATE 0.2 MG/ML IJ SOLN
INTRAMUSCULAR | Status: DC | PRN
  Administered 2012-12-19: .4 mg via INTRAVENOUS

## 2012-12-19 MED ORDER — BISACODYL 5 MG PO TBEC
10.0000 mg | DELAYED_RELEASE_TABLET | Freq: Every day | ORAL | Status: DC
  Administered 2012-12-20: 10 mg via ORAL
  Filled 2012-12-19 (×2): qty 2

## 2012-12-19 MED ORDER — DEXTROSE 5 % IV SOLN
1.5000 g | Freq: Two times a day (BID) | INTRAVENOUS | Status: AC
  Administered 2012-12-19 – 2012-12-21 (×4): 1.5 g via INTRAVENOUS
  Filled 2012-12-19 (×5): qty 1.5

## 2012-12-19 MED ORDER — CEFUROXIME SODIUM 1.5 G IJ SOLR
INTRAMUSCULAR | Status: AC
  Filled 2012-12-19: qty 1.5

## 2012-12-19 MED ORDER — FENTANYL CITRATE 0.05 MG/ML IJ SOLN
25.0000 ug | INTRAMUSCULAR | Status: DC | PRN
  Administered 2012-12-19 – 2012-12-20 (×4): 50 ug via INTRAVENOUS
  Filled 2012-12-19 (×3): qty 2

## 2012-12-19 MED ORDER — DEXTROSE 5 % IV SOLN
INTRAVENOUS | Status: AC
  Filled 2012-12-19: qty 50

## 2012-12-19 MED ORDER — DOXYCYCLINE HYCLATE 100 MG PO TABS
100.0000 mg | ORAL_TABLET | Freq: Two times a day (BID) | ORAL | Status: DC
  Administered 2012-12-20 – 2012-12-23 (×7): 100 mg via ORAL
  Filled 2012-12-19 (×8): qty 1

## 2012-12-19 MED ORDER — FENTANYL CITRATE 0.05 MG/ML IJ SOLN
INTRAMUSCULAR | Status: AC
  Filled 2012-12-19: qty 2

## 2012-12-19 MED ORDER — 0.9 % SODIUM CHLORIDE (POUR BTL) OPTIME
TOPICAL | Status: DC | PRN
  Administered 2012-12-19: 1000 mL

## 2012-12-19 MED ORDER — ACETAMINOPHEN 10 MG/ML IV SOLN
1000.0000 mg | Freq: Once | INTRAVENOUS | Status: AC | PRN
  Administered 2012-12-19: 1000 mg via INTRAVENOUS

## 2012-12-19 MED ORDER — ACETAMINOPHEN 10 MG/ML IV SOLN
1000.0000 mg | Freq: Four times a day (QID) | INTRAVENOUS | Status: DC
  Administered 2012-12-19 – 2012-12-20 (×3): 1000 mg via INTRAVENOUS
  Filled 2012-12-19 (×4): qty 100

## 2012-12-19 MED ORDER — ACETAMINOPHEN 10 MG/ML IV SOLN
INTRAVENOUS | Status: AC
  Filled 2012-12-19: qty 100

## 2012-12-19 MED ORDER — ONDANSETRON HCL 4 MG/2ML IJ SOLN
INTRAMUSCULAR | Status: DC | PRN
  Administered 2012-12-19: 4 mg via INTRAVENOUS

## 2012-12-19 MED ORDER — TRAMADOL HCL 50 MG PO TABS
50.0000 mg | ORAL_TABLET | Freq: Four times a day (QID) | ORAL | Status: DC | PRN
  Administered 2012-12-20: 100 mg via ORAL
  Filled 2012-12-19: qty 2

## 2012-12-19 MED ORDER — ONDANSETRON HCL 4 MG/2ML IJ SOLN: 4.0000 mg | Freq: Once | INTRAMUSCULAR | Status: DC | PRN

## 2012-12-19 SURGICAL SUPPLY — 50 items
ADH SKN CLS APL DERMABOND .7 (GAUZE/BANDAGES/DRESSINGS) ×1
APL SKNCLS STERI-STRIP NONHPOA (GAUZE/BANDAGES/DRESSINGS)
ATTRACTOMAT 16X20 MAGNETIC DRP (DRAPES) ×3 IMPLANT
BENZOIN TINCTURE PRP APPL 2/3 (GAUZE/BANDAGES/DRESSINGS) ×1 IMPLANT
CANISTER SUCTION 2500CC (MISCELLANEOUS) ×3 IMPLANT
CATH THORACIC 28FR (CATHETERS) IMPLANT
CATH THORACIC 28FR RT ANG (CATHETERS) ×2 IMPLANT
CATH THORACIC 36FR (CATHETERS) IMPLANT
CATH THORACIC 36FR RT ANG (CATHETERS) IMPLANT
CLOSURE WOUND 1/2 X4 (GAUZE/BANDAGES/DRESSINGS)
CLOTH BEACON ORANGE TIMEOUT ST (SAFETY) ×3 IMPLANT
CONT SPEC 4OZ CLIKSEAL STRL BL (MISCELLANEOUS) IMPLANT
COVER SURGICAL LIGHT HANDLE (MISCELLANEOUS) ×6 IMPLANT
DERMABOND ADVANCED (GAUZE/BANDAGES/DRESSINGS) ×2
DERMABOND ADVANCED .7 DNX12 (GAUZE/BANDAGES/DRESSINGS) IMPLANT
DRAIN CHANNEL 28F RND 3/8 FF (WOUND CARE) ×3 IMPLANT
DRAPE LAPAROSCOPIC ABDOMINAL (DRAPES) ×3 IMPLANT
DRAPE PROXIMA HALF (DRAPES) ×3 IMPLANT
ELECT REM PT RETURN 9FT ADLT (ELECTROSURGICAL) ×3
ELECTRODE REM PT RTRN 9FT ADLT (ELECTROSURGICAL) ×1 IMPLANT
GLOVE BIO SURGEON STRL SZ7.5 (GLOVE) ×6 IMPLANT
GOWN PREVENTION PLUS XLARGE (GOWN DISPOSABLE) ×4 IMPLANT
GOWN STRL NON-REIN LRG LVL3 (GOWN DISPOSABLE) ×4 IMPLANT
HEMOSTAT POWDER SURGIFOAM 1G (HEMOSTASIS) IMPLANT
KIT BASIN OR (CUSTOM PROCEDURE TRAY) ×3 IMPLANT
KIT ROOM TURNOVER OR (KITS) ×3 IMPLANT
KIT SUCTION CATH 14FR (SUCTIONS) ×2 IMPLANT
NS IRRIG 1000ML POUR BTL (IV SOLUTION) ×3 IMPLANT
PACK CHEST (CUSTOM PROCEDURE TRAY) ×3 IMPLANT
PAD ARMBOARD 7.5X6 YLW CONV (MISCELLANEOUS) ×6 IMPLANT
PAD ELECT DEFIB RADIOL ZOLL (MISCELLANEOUS) ×3 IMPLANT
SPONGE GAUZE 4X4 12PLY (GAUZE/BANDAGES/DRESSINGS) ×2 IMPLANT
STRIP CLOSURE SKIN 1/2X4 (GAUZE/BANDAGES/DRESSINGS) ×1 IMPLANT
SUT SILK 2 0 SH CR/8 (SUTURE) ×1 IMPLANT
SUT VIC AB 1 CTX 18 (SUTURE) ×3 IMPLANT
SUT VIC AB 1 CTX 36 (SUTURE)
SUT VIC AB 1 CTX36XBRD ANBCTR (SUTURE) ×1 IMPLANT
SUT VIC AB 3-0 X1 27 (SUTURE) ×3 IMPLANT
SWAB COLLECTION DEVICE MRSA (MISCELLANEOUS) IMPLANT
SYR 50ML SLIP (SYRINGE) IMPLANT
SYRINGE 10CC LL (SYRINGE) IMPLANT
SYSTEM SAHARA CHEST DRAIN ATS (WOUND CARE) ×2 IMPLANT
TAPE CLOTH SURG 4X10 WHT LF (GAUZE/BANDAGES/DRESSINGS) ×2 IMPLANT
TOWEL OR 17X24 6PK STRL BLUE (TOWEL DISPOSABLE) ×3 IMPLANT
TOWEL OR 17X26 10 PK STRL BLUE (TOWEL DISPOSABLE) ×7 IMPLANT
TRAP SPECIMEN MUCOUS 40CC (MISCELLANEOUS) ×2 IMPLANT
TRAY FOLEY CATH 14FRSI W/METER (CATHETERS) ×3 IMPLANT
TRAY FOLEY IC TEMP SENS 14FR (CATHETERS) ×1 IMPLANT
TUBE ANAEROBIC SPECIMEN COL (MISCELLANEOUS) IMPLANT
WATER STERILE IRR 1000ML POUR (IV SOLUTION) ×6 IMPLANT

## 2012-12-19 NOTE — Op Note (Signed)
NAMESIRIUS, WOODFORD NO.:  [id number]  MEDICAL RECORD NO.:  [id number]  LOCATION:  2307                         FACILITY:  [facility]  PHYSICIAN:  Kerin Perna, M.D.  DATE OF BIRTH:  10/13/1950  DATE OF PROCEDURE:  12/19/2012 DATE OF DISCHARGE:                              OPERATIVE REPORT   OPERATION:  Subxiphoid pericardial window.  SURGEON:  Kerin Perna, MD  ASSISTANT:  Doree Fudge, PA-C  PREOPERATIVE DIAGNOSIS:  Bloody pericardial effusion, status post CABG 15 days previously.  ANESTHESIA:  General by Dr. Noreene Larsson.  INDICATIONS:  The patient is a 62 year old male, who had undergone urgent multivessel bypass grafting for unstable angina.  After being discharged, doing well.  He returned with respiratory complaints of shortness of breath and was found to have a small bibasilar pulmonary emboli.  He was started on anticoagulation including Lovenox and Coumadin and discharged home.  Within 48 hours of discharge, he had a syncopal episode at home and was evaluated at an outlying emergency department.  His INR was elevated and he was transferred to this facility.  A 2D echocardiogram here showed a large pericardial effusion. His INR and PTT were normalized with FFP and vitamin K, and he was scheduled for a pericardial window.  Prior to surgery, I discussed the operation with the patient and family including location of the surgical incision, use of general anesthesia and expected postoperative recovery. I reviewed the potential risks of bleeding, recurrent effusion, arrhythmias, and infection.  He understood and agreed to proceed with surgery under what I felt was an informed consent.  OPERATIVE FINDINGS: 1. 4-500 mL bloody pericardial effusion, dark blood. 2. Fibrinous exudate over the epicardium which was irrigated and     removed 3. Intraoperative TEE showing resolution of preoperative pericardial     effusion.  OPERATIVE PROCEDURE:  The  patient was brought from the ICU to the preop holding area where he was prepared for anesthesia.  I checked the patient in the preop holding area, where he was stable.  The patient was brought back to the operative room, where general anesthesia was induced under invasive hemodynamic monitoring.  The transesophageal echo probe was placed by the anesthesiologist.  The patient was prepped and draped as a sterile field.  A proper time-out was performed. An incision was made at the lower part of the previous sternotomy, carried down to the fascia.  A sternal elevating retractor was placed to elevate the distal sternum.  A plane of dissection was developed down to the pericardium where several 100 mL of dark fluid was removed.  This was sent for culture.  The epicardial fibrinous exudate was also irrigated and removed and sent for culture.  A 28-French angled chest tube was placed dependently in the pericardium and brought out through separate incision and secured to the skin.  The sternal elevating retractor was removed.  The fascia was closed with interrupted #1 Vicryl, which was tied to the previously placed running deep fascia Vicryl.  The subcutaneous and skin layers were also closed with running Vicryl which were tied to the lower part of the previous incision.  Sterile dressing was applied.  The patient is to be extubated and returned to recovery room in stable condition.     Kerin Perna, M.D.     PV/MEDQ  D:  12/19/2012  T:  12/19/2012  Job:  [id number]

## 2012-12-19 NOTE — Progress Notes (Signed)
S/p pericardial window for tamponade  Comfortable  BP 100/63  Pulse 92  Temp(Src) 97.4 F (36.3 C) (Oral)  Resp 9  Ht 5\' 10"  (1.778 m)  Wt 218 lb 1.6 oz (98.93 kg)  BMI 31.29 kg/m2  SpO2 100%   Intake/Output Summary (Last 24 hours) at 12/19/12 1716 Last data filed at 12/19/12 1600  Gross per 24 hour  Intake   2720 ml  Output   1595 ml  Net   1125 ml   CBG well controlled  Continue current care

## 2012-12-19 NOTE — Transfer of Care (Signed)
Immediate Anesthesia Transfer of Care Note  Patient: Dylan Church  Procedure(s) Performed: Procedure(s): SUBXYPHOID PERICARDIAL WINDOW (N/A)  Patient Location: PACU  Anesthesia Type:General  Level of Consciousness: awake, alert , oriented and patient cooperative  Airway & Oxygen Therapy: Patient Spontanous Breathing and Patient connected to face mask oxygen  Post-op Assessment: Report given to PACU RN, Post -op Vital signs reviewed and stable and Patient moving all extremities X 4  Post vital signs: Reviewed and stable  Complications: No apparent anesthesia complications

## 2012-12-19 NOTE — Brief Op Note (Signed)
12/18/2012 - 12/19/2012  10:19 AM  PATIENT:  Dylan Church  62 y.o. male  PRE-OPERATIVE DIAGNOSIS:  1. Pericardial effusion with tamponade  POST-OPERATIVE DIAGNOSIS:  1. Pericardial effusion with tamponade  PROCEDURE: SUBXYPHOID PERICARDIAL WINDOW  Approximately 600 cc of hemorrhagic fluid removed  SURGEON:  Surgeon(s) and Role:    * Kerin Perna, MD - Primary  PHYSICIAN ASSISTANT: Doree Fudge PA-C  ANESTHESIA:   general  EBL:  Total I/O In: -  Out: 750 [Urine:150; Other:600]  BLOOD ADMINISTERED:none  DRAINS: One 50 French Chest Tube(s) in the pericardial space    SPECIMEN:  Source of Specimen:  Pericardial fluid and tissue  DISPOSITION OF SPECIMEN:  Culture and cytology  COUNTS CORRECT:  YES  DICTATION: .Dragon Dictation  PLAN OF CARE: Admit to inpatient   PATIENT DISPOSITION:  ICU - extubated and stable.   Delay start of Pharmacological VTE agent (>24hrs) due to surgical blood loss or risk of bleeding: yes

## 2012-12-19 NOTE — Anesthesia Postprocedure Evaluation (Signed)
  Anesthesia Post-op Note  Patient: Dylan Church  Procedure(s) Performed: Procedure(s): SUBXYPHOID PERICARDIAL WINDOW (N/A)  Patient Location: PACU  Anesthesia Type:General  Level of Consciousness: awake, alert  and oriented  Airway and Oxygen Therapy: Patient Spontanous Breathing and Patient connected to nasal cannula oxygen  Post-op Pain: mild  Post-op Assessment: Post-op Vital signs reviewed, Patient's Cardiovascular Status Stable, Respiratory Function Stable, Patent Airway and Pain level controlled  Post-op Vital Signs: stable  Complications: No apparent anesthesia complications

## 2012-12-19 NOTE — Anesthesia Preprocedure Evaluation (Addendum)
Anesthesia Evaluation  Patient identified by MRN, date of birth, ID band Patient awake    Reviewed: Allergy & Precautions, H&P , NPO status , Patient's Chart, lab work & pertinent test results, reviewed documented beta blocker date and time   Airway Mallampati: II TM Distance: >3 FB     Dental  (+) Edentulous Upper and Edentulous Lower   Pulmonary shortness of breath, with exertion and at rest, COPD COPD inhaler, former smoker,  breath sounds clear to auscultation        Cardiovascular hypertension, Pt. on medications and Pt. on home beta blockers + CAD, + Past MI and + Peripheral Vascular Disease Rhythm:Regular Rate:Tachycardia     Neuro/Psych  Headaches, Anxiety Depression    GI/Hepatic GERD-  Medicated and Controlled,  Endo/Other  diabetes, Type 2  Renal/GU      Musculoskeletal   Abdominal   Peds  Hematology   Anesthesia Other Findings   Reproductive/Obstetrics                          Anesthesia Physical Anesthesia Plan  ASA: IV  Anesthesia Plan: General   Post-op Pain Management:    Induction:   Airway Management Planned: Oral ETT  Additional Equipment: Arterial line  Intra-op Plan:   Post-operative Plan: Extubation in OR  Informed Consent: I have reviewed the patients History and Physical, chart, labs and discussed the procedure including the risks, benefits and alternatives for the proposed anesthesia with the patient or authorized representative who has indicated his/her understanding and acceptance.     Plan Discussed with: CRNA, Anesthesiologist and Surgeon  Anesthesia Plan Comments:       Anesthesia Quick Evaluation

## 2012-12-19 NOTE — Preoperative (Signed)
Beta Blockers   Reason not to administer Beta Blockers:Hold beta blocker due to other, Pt tachycaerdic and Hypotensive due to pericardial effusioni

## 2012-12-19 NOTE — Progress Notes (Signed)
The patient was examined and preop studies reviewed. There has been no change from the prior exam and the patient is ready for surgery.  Plan pericardial window today on T Udovich

## 2012-12-19 NOTE — Progress Notes (Signed)
Verified and discussed Zinacef post-op duration of 48 hours with Doree Fudge, PA -- extended duration requested per Dr. Donata Clay.   Georgina Pillion, PharmD, BCPS Clinical Pharmacist Pager: [phone] 12/19/2012 12:36 PM

## 2012-12-19 NOTE — Anesthesia Procedure Notes (Signed)
Procedure Name: Intubation Date/Time: 12/19/2012 9:12 AM Performed by: Rogelia Boga Pre-anesthesia Checklist: Patient identified, Emergency Drugs available, Suction available, Patient being monitored and Timeout performed Patient Re-evaluated:Patient Re-evaluated prior to inductionOxygen Delivery Method: Circle system utilized Preoxygenation: Pre-oxygenation with 100% oxygen Intubation Type: IV induction Ventilation: Mask ventilation without difficulty and Oral airway inserted - appropriate to patient size Laryngoscope Size: Mac and 4 Grade View: Grade I Tube type: Oral Tube size: 8.0 mm Number of attempts: 1 Airway Equipment and Method: Stylet Placement Confirmation: ETT inserted through vocal cords under direct vision,  positive ETCO2 and breath sounds checked- equal and bilateral Secured at: 22 cm Tube secured with: Tape Dental Injury: Teeth and Oropharynx as per pre-operative assessment

## 2012-12-20 ENCOUNTER — Encounter (HOSPITAL_COMMUNITY): Payer: Self-pay | Admitting: Cardiothoracic Surgery

## 2012-12-20 ENCOUNTER — Inpatient Hospital Stay (HOSPITAL_COMMUNITY): Payer: Non-veteran care

## 2012-12-20 LAB — CBC
HCT: 23.4 % — ABNORMAL LOW (ref 39.0–52.0)
Hemoglobin: 7.7 g/dL — ABNORMAL LOW (ref 13.0–17.0)
WBC: 6.8 10*3/uL (ref 4.0–10.5)

## 2012-12-20 LAB — BASIC METABOLIC PANEL
BUN: 21 mg/dL (ref 6–23)
CO2: 29 mEq/L (ref 19–32)
Chloride: 102 mEq/L (ref 96–112)
Glucose, Bld: 110 mg/dL — ABNORMAL HIGH (ref 70–99)
Potassium: 4.1 mEq/L (ref 3.5–5.1)

## 2012-12-20 LAB — POCT I-STAT 3, ART BLOOD GAS (G3+)
Bicarbonate: 27.7 mEq/L — ABNORMAL HIGH (ref 20.0–24.0)
Patient temperature: 97.7
TCO2: 29 mmol/L (ref 0–100)
pH, Arterial: 7.348 — ABNORMAL LOW (ref 7.350–7.450)

## 2012-12-20 LAB — GLUCOSE, CAPILLARY
Glucose-Capillary: 159 mg/dL — ABNORMAL HIGH (ref 70–99)
Glucose-Capillary: 133 mg/dL — ABNORMAL HIGH (ref 70–99)
Glucose-Capillary: 102 mg/dL — ABNORMAL HIGH (ref 70–99)

## 2012-12-20 MED ORDER — LEVALBUTEROL TARTRATE 45 MCG/ACT IN AERO
2.0000 | INHALATION_SPRAY | Freq: Four times a day (QID) | RESPIRATORY_TRACT | Status: DC
  Administered 2012-12-20 – 2012-12-22 (×10): 2 via RESPIRATORY_TRACT
  Filled 2012-12-20: qty 15

## 2012-12-20 MED ORDER — FE FUMARATE-B12-VIT C-FA-IFC PO CAPS
1.0000 | ORAL_CAPSULE | Freq: Three times a day (TID) | ORAL | Status: DC
  Administered 2012-12-20 – 2012-12-23 (×9): 1 via ORAL
  Filled 2012-12-20 (×12): qty 1

## 2012-12-20 MED ORDER — FUROSEMIDE 10 MG/ML IJ SOLN
40.0000 mg | Freq: Once | INTRAMUSCULAR | Status: AC
  Administered 2012-12-20: 40 mg via INTRAVENOUS
  Filled 2012-12-20: qty 4

## 2012-12-20 MED ORDER — SODIUM CHLORIDE 0.9 % IJ SOLN
10.0000 mL | Freq: Two times a day (BID) | INTRAMUSCULAR | Status: DC
  Administered 2012-12-20 – 2012-12-22 (×5): 10 mL via INTRAVENOUS

## 2012-12-20 MED ORDER — POTASSIUM CHLORIDE CRYS ER 20 MEQ PO TBCR
20.0000 meq | EXTENDED_RELEASE_TABLET | Freq: Once | ORAL | Status: AC
  Administered 2012-12-20: 20 meq via ORAL
  Filled 2012-12-20: qty 1

## 2012-12-20 MED ORDER — WARFARIN SODIUM 2.5 MG PO TABS
2.5000 mg | ORAL_TABLET | Freq: Every day | ORAL | Status: DC
  Administered 2012-12-20 – 2012-12-21 (×2): 2.5 mg via ORAL
  Filled 2012-12-20 (×3): qty 1

## 2012-12-20 MED ORDER — SODIUM CHLORIDE 0.9 % IJ SOLN
3.0000 mL | Freq: Two times a day (BID) | INTRAMUSCULAR | Status: DC
  Administered 2012-12-20 – 2012-12-22 (×6): 3 mL via INTRAVENOUS

## 2012-12-20 MED ORDER — INSULIN DETEMIR 100 UNIT/ML ~~LOC~~ SOLN
15.0000 [IU] | Freq: Two times a day (BID) | SUBCUTANEOUS | Status: DC
  Administered 2012-12-20 – 2012-12-23 (×7): 15 [IU] via SUBCUTANEOUS
  Filled 2012-12-20 (×9): qty 0.15

## 2012-12-20 MED ORDER — INSULIN ASPART 100 UNIT/ML ~~LOC~~ SOLN
0.0000 [IU] | SUBCUTANEOUS | Status: DC
  Administered 2012-12-20 – 2012-12-21 (×3): 2 [IU] via SUBCUTANEOUS

## 2012-12-20 MED ORDER — INSULIN DETEMIR 100 UNIT/ML ~~LOC~~ SOLN
40.0000 [IU] | Freq: Two times a day (BID) | SUBCUTANEOUS | Status: DC
  Filled 2012-12-20 (×2): qty 0.4

## 2012-12-20 MED ORDER — FERROUS SULFATE 325 (65 FE) MG PO TABS
325.0000 mg | ORAL_TABLET | Freq: Every day | ORAL | Status: DC
  Administered 2012-12-20: 325 mg via ORAL
  Filled 2012-12-20 (×2): qty 1

## 2012-12-20 MED ORDER — METOPROLOL TARTRATE 25 MG PO TABS
25.0000 mg | ORAL_TABLET | Freq: Two times a day (BID) | ORAL | Status: DC
  Administered 2012-12-20 – 2012-12-22 (×4): 25 mg via ORAL
  Filled 2012-12-20 (×6): qty 1

## 2012-12-20 MED ORDER — WARFARIN - PHYSICIAN DOSING INPATIENT: Freq: Every day | Status: DC

## 2012-12-20 NOTE — Progress Notes (Signed)
[facility]  DAILY PROGRESS NOTE   Subjective:  Considerably better- alert, back to his talkative self. Denies pain or dyspnea. BP much higher, HR lower. Hemorrhagic fluid in CT clearing rapidly.  Objective:  Temp:  [96.1 F (35.6 C)-97.8 F (36.6 C)] 97.3 F (36.3 C) (04/24 0815) Pulse Rate:  [52-130] 92 (04/24 0737) Resp:  [9-22] 11 (04/24 0737) BP: (87-127)/(55-68) 105/62 mmHg (04/24 0737) SpO2:  [87 %-100 %] 97 % (04/24 0737) Arterial Line BP: (94-144)/(48-68) 116/62 mmHg (04/24 0737) Weight:  [102.468 kg (225 lb 14.4 oz)] 102.468 kg (225 lb 14.4 oz) (04/24 0600) Weight change: 3.538 kg (7 lb 12.8 oz)  Intake/Output from previous day: 04/23 0701 - 04/24 0700 In: 4125 [P.O.:300; I.V.:3275; IV Piggyback:550] Out: 1950 [Urine:1210; Chest Tube:140]  Intake/Output from this shift:    Medications: Current Facility-Administered Medications  Medication Dose Route Frequency Provider Last Rate Last Dose  . 0.9 % NaCl with KCl 20 mEq/ L  infusion   Intravenous Continuous Ardelle Balls, PA-C 100 mL/hr at 12/19/12 1415    . acetaminophen (OFIRMEV) IV 1,000 mg  1,000 mg Intravenous Q6H Ardelle Balls, PA-C   1,000 mg at 12/20/12 1610  . aspirin EC tablet 81 mg  81 mg Oral Daily Donielle Margaretann Loveless, PA-C      . atorvastatin (LIPITOR) tablet 20 mg  20 mg Oral QHS Ripudeep Jenna Luo, MD   20 mg at 12/19/12 2228  . bisacodyl (DULCOLAX) EC tablet 10 mg  10 mg Oral Daily Donielle M Zimmerman, PA-C      . budesonide-formoterol (SYMBICORT) 160-4.5 MCG/ACT inhaler 2 puff  2 puff Inhalation BID Ripudeep Jenna Luo, MD   2 puff at 12/20/12 0737  . cefUROXime (ZINACEF) 1.5 g in dextrose 5 % 50 mL IVPB  1.5 g Intravenous Q12H Ardelle Balls, PA-C   1.5 g at 12/19/12 2229  . digoxin (LANOXIN) tablet 0.25 mg  0.25 mg Oral Daily Ripudeep K Rai, MD   0.25 mg at 12/18/12 1258  . doxycycline (VIBRA-TABS) tablet 100 mg  100 mg Oral Q12H Donielle Margaretann Loveless, PA-C       . fentaNYL (SUBLIMAZE) injection 25-50 mcg  25-50 mcg Intravenous Q2H PRN Ardelle Balls, PA-C   50 mcg at 12/20/12 0345  . ferrous sulfate tablet 325 mg  325 mg Oral Q breakfast Donielle M Zimmerman, PA-C      . furosemide (LASIX) injection 40 mg  40 mg Intravenous Once Donielle M Zimmerman, PA-C      . gabapentin (NEURONTIN) capsule 800 mg  800 mg Oral TID Ripudeep Jenna Luo, MD   800 mg at 12/19/12 2228  . insulin aspart (novoLOG) injection 0-24 Units  0-24 Units Subcutaneous Q4H Donielle Margaretann Loveless, PA-C      . levalbuterol [facility]) inhaler 2 puff  2 puff Inhalation Q6H Ripudeep Jenna Luo, MD   2 puff at 12/20/12 0737  . metoprolol tartrate (LOPRESSOR) tablet 25 mg  25 mg Oral BID Donielle M Zimmerman, PA-C      . ondansetron [facility]) injection 4 mg  4 mg Intravenous Q6H PRN Ardelle Balls, PA-C      . oxyCODONE-acetaminophen (PERCOCET/ROXICET) 5-325 MG per tablet 1-2 tablet  1-2 tablet Oral Q4H PRN Ardelle Balls, PA-C   1 tablet at 12/20/12 [phone]  . pantoprazole (PROTONIX) EC tablet 40 mg  40 mg Oral Daily Donielle Margaretann Loveless, PA-C      . potassium chloride SA (K-DUR,KLOR-CON) CR  tablet 20 mEq  20 mEq Oral Once Donielle M Zimmerman, PA-C      . senna-docusate (Senokot-S) tablet 1 tablet  1 tablet Oral QHS PRN Ardelle Balls, PA-C      . sertraline (ZOLOFT) tablet 50 mg  50 mg Oral Daily Ripudeep Jenna Luo, MD   50 mg at 12/18/12 1258  . traMADol (ULTRAM) tablet 50-100 mg  50-100 mg Oral Q6H PRN Ardelle Balls, PA-C        Physical Exam: General appearance: alert, cooperative and no distress Neck: no carotid bruit, no JVD, supple, symmetrical, trachea midline and thyroid not enlarged, symmetric, no tenderness/mass/nodules Lungs: diminished breath sounds bibasilar Heart: regular rate and rhythm, S1, S2 normal, no murmur, click, rub or gallop Abdomen: soft, non-tender; bowel sounds normal; no masses,  no organomegaly Extremities: extremities normal, atraumatic,  no cyanosis or edema Pulses: 2+ and symmetric Skin: Skin color, texture, turgor normal. No rashes or lesions Neurologic: Grossly normal  Lab Results: Results for orders placed during the hospital encounter of 12/18/12 (from the past 48 hour(s))  TROPONIN I     Status: None   Collection Time    12/18/12  9:48 AM      Result Value Range   Troponin I <0.30  <0.30 ng/mL   Comment:            Due to the release kinetics of cTnI,     a negative result within the first hours     of the onset of symptoms does not rule out     myocardial infarction with certainty.     If myocardial infarction is still suspected,     repeat the test at appropriate intervals.  CK TOTAL AND CKMB     Status: None   Collection Time    12/18/12  9:48 AM      Result Value Range   Total CK 49  7 - 232 U/L   CK, MB 3.3  0.3 - 4.0 ng/mL   Relative Index RELATIVE INDEX IS INVALID  0.0 - 2.5   Comment: WHEN CK < 100 U/L             HEMOGLOBIN A1C     Status: Abnormal   Collection Time    12/18/12 11:06 AM      Result Value Range   Hemoglobin A1C 8.4 (*) <5.7 %   Comment: (NOTE)                                                                               According to the ADA Clinical Practice Recommendations for 2011, when     HbA1c is used as a screening test:      >=6.5%   Diagnostic of Diabetes Mellitus               (if abnormal result is confirmed)     5.7-6.4%   Increased risk of developing Diabetes Mellitus     References:Diagnosis and Classification of Diabetes Mellitus,Diabetes     Care,2011,34(Suppl 1):S62-S69 and Standards of Medical Care in             Diabetes - 2011,Diabetes Care,2011,34 (Suppl 1):S11-S61.   Mean Plasma  Glucose 194 (*) <117 mg/dL  BASIC METABOLIC PANEL     Status: Abnormal   Collection Time    12/18/12 11:06 AM      Result Value Range   Sodium 134 (*) 135 - 145 mEq/L   Potassium 5.1  3.5 - 5.1 mEq/L   Chloride 98  96 - 112 mEq/L   CO2 23  19 - 32 mEq/L   Glucose, Bld 192  (*) 70 - 99 mg/dL   BUN 34 (*) 6 - 23 mg/dL   Creatinine, Ser 1.61  0.50 - 1.35 mg/dL   Calcium 9.1  8.4 - 09.6 mg/dL   GFR calc non Af Amer >90  >90 mL/min   GFR calc Af Amer >90  >90 mL/min   Comment:            The eGFR has been calculated     using the CKD EPI equation.     This calculation has not been     validated in all clinical     situations.     eGFR's persistently     <90 mL/min signify     possible Chronic Kidney Disease.  CBC     Status: Abnormal   Collection Time    12/18/12 11:06 AM      Result Value Range   WBC 13.0 (*) 4.0 - 10.5 K/uL   RBC 2.95 (*) 4.22 - 5.81 MIL/uL   Hemoglobin 9.6 (*) 13.0 - 17.0 g/dL   HCT 04.5 (*) 40.9 - 81.1 %   MCV 96.6  78.0 - 100.0 fL   MCH 32.5  26.0 - 34.0 pg   MCHC 33.7  30.0 - 36.0 g/dL   RDW 91.4  78.2 - 95.6 %   Platelets 267  150 - 400 K/uL  PROTIME-INR     Status: Abnormal   Collection Time    12/18/12 11:06 AM      Result Value Range   Prothrombin Time 17.3 (*) 11.6 - 15.2 seconds   INR 1.46  0.00 - 1.49  CULTURE, BLOOD (ROUTINE X 2)     Status: None   Collection Time    12/18/12 11:25 AM      Result Value Range   Specimen Description BLOOD RIGHT HAND     Special Requests BOTTLES DRAWN AEROBIC ONLY 6CC     Culture  Setup Time 12/18/2012 17:24     Culture       Value:        BLOOD CULTURE RECEIVED NO GROWTH TO DATE CULTURE WILL BE HELD FOR 5 DAYS BEFORE ISSUING A FINAL NEGATIVE REPORT   Report Status PENDING    CULTURE, BLOOD (ROUTINE X 2)     Status: None   Collection Time    12/18/12 11:40 AM      Result Value Range   Specimen Description BLOOD LEFT HAND     Special Requests BOTTLES DRAWN AEROBIC ONLY 5CC     Culture  Setup Time 12/18/2012 17:24     Culture       Value:        BLOOD CULTURE RECEIVED NO GROWTH TO DATE CULTURE WILL BE HELD FOR 5 DAYS BEFORE ISSUING A FINAL NEGATIVE REPORT   Report Status PENDING    GLUCOSE, CAPILLARY     Status: Abnormal   Collection Time    12/18/12 11:54 AM      Result  Value Range   Glucose-Capillary 170 (*) 70 - 99 mg/dL  TROPONIN I  Status: None   Collection Time    12/18/12  3:35 PM      Result Value Range   Troponin I <0.30  <0.30 ng/mL   Comment:            Due to the release kinetics of cTnI,     a negative result within the first hours     of the onset of symptoms does not rule out     myocardial infarction with certainty.     If myocardial infarction is still suspected,     repeat the test at appropriate intervals.  CK TOTAL AND CKMB     Status: None   Collection Time    12/18/12  3:35 PM      Result Value Range   Total CK 71  7 - 232 U/L   CK, MB 3.2  0.3 - 4.0 ng/mL   Relative Index RELATIVE INDEX IS INVALID  0.0 - 2.5   Comment: WHEN CK < 100 U/L             TYPE AND SCREEN     Status: None   Collection Time    12/18/12  4:50 PM      Result Value Range   ABO/RH(D) A POS     Antibody Screen NEG     Sample Expiration 12/21/2012     Unit Number [id number]     Blood Component Type RED CELLS,LR     Unit division 00     Status of Unit ALLOCATED     Transfusion Status OK TO TRANSFUSE     Crossmatch Result Compatible     Unit Number [id number]     Blood Component Type RED CELLS,LR     Unit division 00     Status of Unit ALLOCATED     Transfusion Status OK TO TRANSFUSE     Crossmatch Result Compatible    GLUCOSE, CAPILLARY     Status: Abnormal   Collection Time    12/18/12  4:52 PM      Result Value Range   Glucose-Capillary 158 (*) 70 - 99 mg/dL  APTT     Status: Abnormal   Collection Time    12/18/12  4:55 PM      Result Value Range   aPTT 56 (*) 24 - 37 seconds   Comment:            IF BASELINE aPTT IS ELEVATED,     SUGGEST PATIENT RISK ASSESSMENT     BE USED TO DETERMINE APPROPRIATE     ANTICOAGULANT THERAPY.  PREPARE FRESH FROZEN PLASMA     Status: None   Collection Time    12/18/12  6:00 PM      Result Value Range   Unit Number [id number]     Blood Component Type THAWED PLASMA     Unit division 00      Status of Unit ISSUED,FINAL     Transfusion Status OK TO TRANSFUSE     Unit Number [id number]     Blood Component Type THAWED PLASMA     Unit division 00     Status of Unit ISSUED,FINAL     Transfusion Status OK TO TRANSFUSE    PREPARE RBC (CROSSMATCH)     Status: None   Collection Time    12/18/12  6:38 PM      Result Value Range   Order Confirmation ORDER PROCESSED BY BLOOD BANK    GLUCOSE, CAPILLARY  Status: Abnormal   Collection Time    12/18/12  7:38 PM      Result Value Range   Glucose-Capillary 189 (*) 70 - 99 mg/dL   Comment 1 Documented in Chart     Comment 2 Notify RN    GLUCOSE, CAPILLARY     Status: Abnormal   Collection Time    12/18/12 11:45 PM      Result Value Range   Glucose-Capillary 132 (*) 70 - 99 mg/dL   Comment 1 Documented in Chart     Comment 2 Notify RN    GLUCOSE, CAPILLARY     Status: None   Collection Time    12/19/12  4:14 AM      Result Value Range   Glucose-Capillary 90  70 - 99 mg/dL   Comment 1 Documented in Chart     Comment 2 Notify RN    BASIC METABOLIC PANEL     Status: Abnormal   Collection Time    12/19/12  5:00 AM      Result Value Range   Sodium 135  135 - 145 mEq/L   Potassium 4.0  3.5 - 5.1 mEq/L   Comment: DELTA CHECK NOTED   Chloride 101  96 - 112 mEq/L   CO2 26  19 - 32 mEq/L   Glucose, Bld 93  70 - 99 mg/dL   BUN 32 (*) 6 - 23 mg/dL   Creatinine, Ser 1.61  0.50 - 1.35 mg/dL   Calcium 9.0  8.4 - 09.6 mg/dL   GFR calc non Af Amer >90  >90 mL/min   GFR calc Af Amer >90  >90 mL/min   Comment:            The eGFR has been calculated     using the CKD EPI equation.     This calculation has not been     validated in all clinical     situations.     eGFR's persistently     <90 mL/min signify     possible Chronic Kidney Disease.  CBC     Status: Abnormal   Collection Time    12/19/12  5:00 AM      Result Value Range   WBC 11.7 (*) 4.0 - 10.5 K/uL   RBC 2.70 (*) 4.22 - 5.81 MIL/uL   Hemoglobin 8.6 (*) 13.0 -  17.0 g/dL   HCT 04.5 (*) 40.9 - 81.1 %   MCV 98.9  78.0 - 100.0 fL   MCH 31.9  26.0 - 34.0 pg   MCHC 32.2  30.0 - 36.0 g/dL   RDW 91.4  78.2 - 95.6 %   Platelets 223  150 - 400 K/uL  PROTIME-INR     Status: Abnormal   Collection Time    12/19/12  5:00 AM      Result Value Range   Prothrombin Time 15.6 (*) 11.6 - 15.2 seconds   INR 1.27  0.00 - 1.49  BODY FLUID CULTURE     Status: None   Collection Time    12/19/12  8:51 AM      Result Value Range   Specimen Description FLUID PERICARDIAL     Special Requests PT ON ZINACEF     Gram Stain       Value: RARE WBC PRESENT,BOTH PMN AND MONONUCLEAR     NO ORGANISMS SEEN   Culture PENDING     Report Status PENDING    TISSUE CULTURE     Status:  None   Collection Time    12/19/12  9:42 AM      Result Value Range   Specimen Description TISSUE PERICARDIUM     Special Requests PT ON ZINACEF     Gram Stain       Value: NO WBC SEEN     NO ORGANISMS SEEN   Culture NO GROWTH 1 DAY     Report Status PENDING    GLUCOSE, CAPILLARY     Status: Abnormal   Collection Time    12/19/12 10:46 AM      Result Value Range   Glucose-Capillary 105 (*) 70 - 99 mg/dL   Comment 1 Notify RN    GLUCOSE, CAPILLARY     Status: Abnormal   Collection Time    12/19/12 12:23 PM      Result Value Range   Glucose-Capillary 103 (*) 70 - 99 mg/dL   Comment 1 Documented in Chart     Comment 2 Notify RN    URINALYSIS, ROUTINE W REFLEX MICROSCOPIC     Status: Abnormal   Collection Time    12/19/12  2:42 PM      Result Value Range   Color, Urine YELLOW  YELLOW   APPearance CLOUDY (*) CLEAR   Specific Gravity, Urine 1.026  1.005 - 1.030   pH 5.5  5.0 - 8.0   Glucose, UA NEGATIVE  NEGATIVE mg/dL   Hgb urine dipstick SMALL (*) NEGATIVE   Bilirubin Urine NEGATIVE  NEGATIVE   Ketones, ur NEGATIVE  NEGATIVE mg/dL   Protein, ur NEGATIVE  NEGATIVE mg/dL   Urobilinogen, UA 1.0  0.0 - 1.0 mg/dL   Nitrite NEGATIVE  NEGATIVE   Leukocytes, UA SMALL (*) NEGATIVE   URINE MICROSCOPIC-ADD ON     Status: Abnormal   Collection Time    12/19/12  2:42 PM      Result Value Range   WBC, UA 3-6  <3 WBC/hpf   Bacteria, UA RARE  RARE   Crystals URIC ACID CRYSTALS (*) NEGATIVE   Urine-Other MUCOUS PRESENT    GLUCOSE, CAPILLARY     Status: None   Collection Time    12/19/12  4:38 PM      Result Value Range   Glucose-Capillary 82  70 - 99 mg/dL   Comment 1 Documented in Chart     Comment 2 Notify RN    GLUCOSE, CAPILLARY     Status: Abnormal   Collection Time    12/19/12  8:01 PM      Result Value Range   Glucose-Capillary 111 (*) 70 - 99 mg/dL   Comment 1 Documented in Chart     Comment 2 Notify RN    GLUCOSE, CAPILLARY     Status: Abnormal   Collection Time    12/19/12 11:33 PM      Result Value Range   Glucose-Capillary 110 (*) 70 - 99 mg/dL   Comment 1 Documented in Chart     Comment 2 Notify RN    CBC     Status: Abnormal   Collection Time    12/20/12  3:40 AM      Result Value Range   WBC 6.8  4.0 - 10.5 K/uL   RBC 2.35 (*) 4.22 - 5.81 MIL/uL   Hemoglobin 7.7 (*) 13.0 - 17.0 g/dL   HCT 84.6 (*) 96.2 - 95.2 %   MCV 99.6  78.0 - 100.0 fL   MCH 32.8  26.0 - 34.0 pg   MCHC  32.9  30.0 - 36.0 g/dL   RDW 16.1  09.6 - 04.5 %   Platelets 182  150 - 400 K/uL  BASIC METABOLIC PANEL     Status: Abnormal   Collection Time    12/20/12  3:40 AM      Result Value Range   Sodium 137  135 - 145 mEq/L   Potassium 4.1  3.5 - 5.1 mEq/L   Chloride 102  96 - 112 mEq/L   CO2 29  19 - 32 mEq/L   Glucose, Bld 110 (*) 70 - 99 mg/dL   BUN 21  6 - 23 mg/dL   Comment: DELTA CHECK NOTED   Creatinine, Ser 0.74  0.50 - 1.35 mg/dL   Calcium 8.6  8.4 - 40.9 mg/dL   GFR calc non Af Amer >90  >90 mL/min   GFR calc Af Amer >90  >90 mL/min   Comment:            The eGFR has been calculated     using the CKD EPI equation.     This calculation has not been     validated in all clinical     situations.     eGFR's persistently     <90 mL/min signify      possible Chronic Kidney Disease.  POCT I-STAT 3, BLOOD GAS (G3+)     Status: Abnormal   Collection Time    12/20/12  3:51 AM      Result Value Range   pH, Arterial 7.348 (*) 7.350 - 7.450   pCO2 arterial 50.2 (*) 35.0 - 45.0 mmHg   pO2, Arterial 66.0 (*) 80.0 - 100.0 mmHg   Bicarbonate 27.7 (*) 20.0 - 24.0 mEq/L   TCO2 29  0 - 100 mmol/L   O2 Saturation 92.0     Acid-Base Excess 2.0  0.0 - 2.0 mmol/L   Patient temperature 97.7 F     Sample type ARTERIAL    GLUCOSE, CAPILLARY     Status: None   Collection Time    12/20/12  3:57 AM      Result Value Range   Glucose-Capillary 91  70 - 99 mg/dL  GLUCOSE, CAPILLARY     Status: Abnormal   Collection Time    12/20/12  8:09 AM      Result Value Range   Glucose-Capillary 112 (*) 70 - 99 mg/dL   Comment 1 Documented in Chart     Comment 2 Notify RN      Imaging: Dg Chest Port 1 View  12/20/2012  *RADIOLOGY REPORT*  Clinical Data: Chest tube placement.  PORTABLE CHEST - 1 VIEW  Comparison: One-view chest 12/19/2012.  Findings: The right IJ line is stable in position, within the SVC. The heart size is exaggerated by low lung volumes.  A left pleural effusion is stable.  Mild bibasilar atelectasis is unchanged.  The mediastinal drain seen on the prior study may be below the field of view.  No chest tube is identified.  IMPRESSION:  1.  Stable left pleural effusion and bibasilar atelectasis. 2.  Low lung volumes. 3.  The previously noted mediastinal drain the is likely below the field of view on this study.   Original Report Authenticated By: Marin Roberts, M.D.    Dg Chest Portable 1 View  12/19/2012  *RADIOLOGY REPORT*  Clinical Data: Status post pericardial window placement.  Line placement.  PORTABLE CHEST - 1 VIEW  Comparison: Single view of the chest 12/18/2012.  Findings:  Right IJ catheter is in place with the tip projecting over the lower superior vena cava.  No pneumothorax is identified. Enlargement of the cardiopericardial  silhouette is again seen. Hazy opacity over the left chest is compatible with a layering effusion.  There is some left basilar atelectasis.  Pericardial drain is noted.  IMPRESSION:  1.  Status post pericardial drain and right IJ catheter placement. No pneumothorax. 2.  Hazy opacity in the left chest is compatible with a layering pleural effusion.   Original Report Authenticated By: Holley Dexter, M.D.    Dg Chest Port 1 View  12/18/2012  *RADIOLOGY REPORT*  Clinical Data: CABG.  PORTABLE CHEST - 1 VIEW  Comparison: 1 day prior  Findings: Midline trachea.  Cardiomegaly accentuated by AP portable technique.  Resolved right pleural effusion. No pneumothorax.  Mild pulmonary venous congestion suspected.  Mild bibasilar volume loss, increased.  IMPRESSION: Cardiomegaly with development of mild pulmonary venous congestion.  Resolved right-sided pleural effusion.  Decreased aeration, likely due to bibasilar subsegmental atelectasis.   Original Report Authenticated By: Jeronimo Greaves, M.D.     Assessment:  1. Principal Problem: 2.   Syncope 3. Active Problems: 4.   DIABETES MELLITUS 5.   HYPERLIPIDEMIA 6.   HYPERTENSION 7.   BACK PAIN, CHRONIC- followed at pain clinic, he has an implantable MSO4 pump 8.   CAD h/o PCI,  progression 11/29/12- CABG - X3 12/03/12 9.   Agent orange exposure- on disabilty from. [place] Vet '69-70 10.   Diastolic dysfunction grade 1 with an EF 60-65% 2D 11/30/12 11.   Pulmonary embolism post CABG 12.   Warfarin-induced coagulopathy- reversed 13.   Hemorrhagic pericardial effusion 14.   Plan:  1. Pericardial tamponade resolved 2. Resume anticoagulation slowly, without heparin/LMWH, allow warfarin to gradually take effect.  Time Spent Directly with Patient:  30 minutes  Length of Stay:  LOS: 2 days    Dylan Church 12/20/2012, 9:22 AM

## 2012-12-20 NOTE — Progress Notes (Addendum)
                   [address]            [place] [address]          [phone]      1 Day Post-Op Procedure(s) (LRB): SUBXYPHOID PERICARDIAL WINDOW (N/A)  Subjective: Patient is sitting in chair awake and alert.  Objective: Vital signs in last 24 hours: Temp:  [96.1 F (35.6 C)-97.8 F (36.6 C)] 96.1 F (35.6 C) (04/24 0737) Pulse Rate:  [52-130] 92 (04/24 0737) Cardiac Rhythm:  [-] Normal sinus rhythm (04/24 0751) Resp:  [9-22] 11 (04/24 0737) BP: (87-127)/(55-68) 105/62 mmHg (04/24 0737) SpO2:  [87 %-100 %] 97 % (04/24 0737) Arterial Line BP: (94-144)/(48-68) 116/62 mmHg (04/24 0737) Weight:  [102.468 kg (225 lb 14.4 oz)] 102.468 kg (225 lb 14.4 oz) (04/24 0600)   Current Weight  12/20/12 102.468 kg (225 lb 14.4 oz)       Intake/Output from previous day: 04/23 0701 - 04/24 0700 In: 4125 [P.O.:300; I.V.:3275; IV Piggyback:550] Out: 1950 [Urine:1210; Chest Tube:140]   Physical Exam:  Cardiovascular: RRR Pulmonary: Some crackles Abdomen: Soft, non tender, bowel sounds present. Extremities: Trace bilateral lower extremity edema. Wound: Dressing is clean and dry.    Lab Results: CBC: Recent Labs  12/19/12 0500 12/20/12 0340  WBC 11.7* 6.8  HGB 8.6* 7.7*  HCT 26.7* 23.4*  PLT 223 182   BMET:  Recent Labs  12/19/12 0500 12/20/12 0340  NA 135 137  K 4.0 4.1  CL 101 102  CO2 26 29  GLUCOSE 93 110*  BUN 32* 21  CREATININE 0.87 0.74  CALCIUM 9.0 8.6    PT/INR:  Lab Results  Component Value Date   INR 1.27 12/19/2012   INR 1.46 12/18/2012   INR 3.01* 12/15/2012   ABG:  INR: Will add last result for INR, ABG once components are confirmed Will add last 4 CBG results once components are confirmed  Assessment/Plan:  1. CV - SR. On Lopressor 37.5 bid, Digoxin 0.25 daily. Per discussion with Dr. Donata Clay, will restart baby ecasa and low dose Coumadin. NO lovenox. 2.  Pulmonary - Pericardial tube with 140 cc of output. Will  leave to suction today.CXR this am appears to show patient is rotated to the right, pulmonary vascular congestion, atelectasis, and no pneumothorax. 3. Volume Overload - Lasix 40 IV today 4.  Acute blood loss anemia - H and H down to 7.7 and 23.4. Start Ferrous. Monitor need for transfusion 5. Remove a line, foley;decrease IVF 6.DM-CBGS 111/110/91. Re start low dose Insulin 7.Transfer to 2000   ZIMMERMAN,DONIELLE MPA-C 12/20/2012,8:13 AM  patient examined and medical record reviewed,agree with above note. Plan transfer to telemetry bed. Keep pericardial drain in today. Goal INR is 2.0 with a gradual increase. VAN TRIGT III,Keyani Rigdon 12/20/2012

## 2012-12-20 NOTE — Progress Notes (Signed)
Pt amb 750 ft pushing walker with CT and O2. Pt did not have any complaint. Pt placed in chair with call bell in reach.

## 2012-12-20 NOTE — Plan of Care (Signed)
Problem: Phase I Progression Outcomes Goal: Initial discharge plan identified Outcome: Completed/Met Date Met:  12/20/12 Home with wife.  ? Need for [facility] nurse.

## 2012-12-20 NOTE — Progress Notes (Signed)
Transfer to 2020 via wheelchair with propak and O2.  Ambulated x 300 ft. Prior to transfer.  Tolerated ambulation well

## 2012-12-21 ENCOUNTER — Other Ambulatory Visit: Payer: Self-pay | Admitting: *Deleted

## 2012-12-21 ENCOUNTER — Inpatient Hospital Stay (HOSPITAL_COMMUNITY): Payer: Non-veteran care

## 2012-12-21 DIAGNOSIS — I2510 Atherosclerotic heart disease of native coronary artery without angina pectoris: Secondary | ICD-10-CM

## 2012-12-21 LAB — PROTIME-INR: INR: 1.51 — ABNORMAL HIGH (ref 0.00–1.49)

## 2012-12-21 LAB — CBC
MCH: 32.4 pg (ref 26.0–34.0)
MCHC: 32.1 g/dL (ref 30.0–36.0)
Platelets: 173 10*3/uL (ref 150–400)
RBC: 2.41 MIL/uL — ABNORMAL LOW (ref 4.22–5.81)
RDW: 15.2 % (ref 11.5–15.5)

## 2012-12-21 LAB — COMPREHENSIVE METABOLIC PANEL
ALT: 20 U/L (ref 0–53)
AST: 20 U/L (ref 0–37)
Albumin: 2.6 g/dL — ABNORMAL LOW (ref 3.5–5.2)
CO2: 31 mEq/L (ref 19–32)
Calcium: 8.7 mg/dL (ref 8.4–10.5)
Sodium: 137 mEq/L (ref 135–145)
Total Protein: 6.3 g/dL (ref 6.0–8.3)

## 2012-12-21 LAB — GLUCOSE, CAPILLARY
Glucose-Capillary: 138 mg/dL — ABNORMAL HIGH (ref 70–99)
Glucose-Capillary: 77 mg/dL (ref 70–99)

## 2012-12-21 MED ORDER — INSULIN ASPART 100 UNIT/ML ~~LOC~~ SOLN
0.0000 [IU] | Freq: Three times a day (TID) | SUBCUTANEOUS | Status: DC
  Administered 2012-12-22: 2 [IU] via SUBCUTANEOUS

## 2012-12-21 MED ORDER — SODIUM CHLORIDE 0.9 % IJ SOLN
10.0000 mL | INTRAMUSCULAR | Status: DC | PRN
  Administered 2012-12-21 – 2012-12-22 (×3): 20 mL
  Administered 2012-12-22 (×2): 10 mL

## 2012-12-21 MED ORDER — FUROSEMIDE 10 MG/ML IJ SOLN
40.0000 mg | Freq: Every day | INTRAMUSCULAR | Status: DC
  Administered 2012-12-21 – 2012-12-23 (×3): 40 mg via INTRAVENOUS
  Filled 2012-12-21 (×3): qty 4

## 2012-12-21 MED ORDER — POTASSIUM CHLORIDE 10 MEQ/50ML IV SOLN
10.0000 meq | INTRAVENOUS | Status: AC
  Administered 2012-12-21 (×2): 10 meq via INTRAVENOUS
  Filled 2012-12-21 (×2): qty 50

## 2012-12-21 NOTE — Progress Notes (Addendum)
2 Days Post-Op Procedure(s) (LRB): SUBXYPHOID PERICARDIAL WINDOW (N/A) Subjective:  Dylan Church has no complaints this morning.   Objective: Vital signs in last 24 hours: Temp:  [97.3 F (36.3 C)-97.9 F (36.6 C)] 97.6 F (36.4 C) (04/25 0400) Pulse Rate:  [85-97] 85 (04/25 0400) Cardiac Rhythm:  [-] Normal sinus rhythm (04/24 1914) Resp:  [13-18] 18 (04/25 0400) BP: (101-122)/(52-70) 104/63 mmHg (04/25 0400) SpO2:  [94 %-100 %] 94 % (04/25 0400) Arterial Line BP: (120-130)/(52-64) 120/52 mmHg (04/24 1000)  Intake/Output from previous day: 04/24 0701 - 04/25 0700 In: 690 [P.O.:480; I.V.:110; IV Piggyback:100] Out: 1840 [Urine:1800; Chest Tube:40]  General appearance: alert, cooperative and no distress Heart: regular rate and rhythm Lungs: coarse, clear with cough Abdomen: soft, non-tender; bowel sounds normal; no masses,  no organomegaly Wound: clean and dry, LLE groin incision healing nicely no more evidence of cellulitis  Lab Results:  Recent Labs  12/20/12 0340 12/21/12 0505  WBC 6.8 6.9  HGB 7.7* 7.8*  HCT 23.4* 24.3*  PLT 182 173   BMET:  Recent Labs  12/20/12 0340 12/21/12 0505  NA 137 137  K 4.1 3.5  CL 102 101  CO2 29 31  GLUCOSE 110* 83  BUN 21 12  CREATININE 0.74 0.69  CALCIUM 8.6 8.7    PT/INR:  Recent Labs  12/21/12 0505  LABPROT 17.8*  INR 1.51*   ABG    Component Value Date/Time   PHART 7.348* 12/20/2012 0351   HCO3 27.7* 12/20/2012 0351   TCO2 29 12/20/2012 0351   O2SAT 92.0 12/20/2012 0351   CBG (last 3)   Recent Labs  12/20/12 2116 12/21/12 0009 12/21/12 0402  GLUCAP 102* 138* 77    Assessment/Plan: S/P Procedure(s) (LRB): SUBXYPHOID PERICARDIAL WINDOW (N/A)  1. CV- NSR on Lopressor and Digoxin 2. Bilateral PE- INR 1.52 willl continue 2.5 mg Coumadin 3. Pericardial drain- 40 cc output, could likely d/c however will defer to staff 4. Expected blood loss anemia- Hgb stable at 7.8 5. CBGs controlled 6. Dispo- continue  current care  LOS: 3 days    Dylan Church, Dylan Church 12/21/2012  patient examined and medical record reviewed,agree with above note.  DC pericardial drain No more than 2.5 coumadin daily- INR goal 2.0 Cont Lasix IV for now Dylan Church,Dylan Church 12/21/2012

## 2012-12-21 NOTE — Progress Notes (Signed)
CARDIAC REHAB PHASE I   PRE:  Rate/Rhythm: 90SR  BP:  Supine:   Sitting: 100/50  Standing:    SaO2: 90%RA  MODE:  Ambulation: 550 ft   POST:  Rate/Rhythm: 102  BP:  Supine:   Sitting: 140/70  Standing:    SaO2: 89-92%RA 1055-1140 Pt walked 550 ft on RA with rolling walker and asst x 1. Gait steady. Tolerated well. To recliner after walk. Pt motivated to walk.   Luetta Nutting, RN BSN  12/21/2012 11:36 AM

## 2012-12-21 NOTE — Discharge Summary (Signed)
Physician Discharge Summary  Patient ID: Dylan Church MRN: [id number] DOB/AGE: 1951/07/08 62 y.o.  Admit date: 12/18/2012 Discharge date: 12/23/2012  Admission Diagnoses: 1.Pericardial effusion with tamponade 2.History of CAD (s/p CABG x 3 12/03/2012) 3. History of diabetes mellitus 4.History of PE (last admission) 5.History of hyperlipidemia 6.History of hypertension 7.History of emphysema 8.Syncope 9.History of chronic back pain (has implantable Morphine pump) 10.History of obesity 11.History of tobacco abuse 12.History of agent orange exposure ([place] Vet) 13.History of GERD  Discharge Diagnoses:  1.Pericardial effusion with tamponade 2.History of CAD (s/p CABG x 3 12/03/2012) 3. History of diabetes mellitus 4.History of PE (last admission) 5.History of hyperlipidemia 6.History of hypertension 7.History of emphysema 8.Syncope 9.History of chronic back pain (has implantable Morphine pump) 10.History of obesity 11.History of tobacco abuse 12.History of agent orange exposure ([place] Vet) 13.History of GERD  Procedure (s):  Subxiphoid pericardial window by Dr. Donata Clay on 12/19/2012.  History of Presenting Illness: This is a 62 year old Caucasian male with history of hypertension, hyperlipidemia, diabetes, coronary artery disease (s/p CABG on 12/03/12), and postoperative pulmonary embolism ,which was treated with Coumadin and Lovenox. The patient was discharged home on 12/15/12. Per patient, he did not feel "too good" on 12/16/12. The following day, he felt nauseated and did not eat well due to poor appetite. The evening of 4/21 around 7 PM, the patient was going to the [place], when he felt dizzy and lightheaded and passed out. He denied any chest pain, shortness of breath, palpitations, numbness or tingling,or focal weakness prior to the incident. He initially went to [facility] ED where he was found to have systolic blood pressure in 80s-low 90's. INR was over 20 but no bleeding. He  received vitamin K,2 units of FFP, and was given 1 L of IV fluids. He was then transferred to [facility] for further workup. An echo was done on 4/22. LVEF was found to be 55-60%. Most importantly, a large,loculated pericardial effusion with right atrial collapse (tamponade). A cardiothoracic consultation was obtained with Dr. Donata Clay for the consideration of a pericardial window. Potential risks, complications, and benefits of the surgery were discussed with the patient and he agreed to proceed. He underwent a subxiphoid pericardial window on 4/23.  Brief Hospital Course:  He has remained afebrile and hemodynamically stable. His a line and foley were removed on post op day one. He was anemic. He did not require a blood transfusion. He was started on trinsicon.His last H and H was 7.8 and 24.3.He was felt surgically stable for transfer from the ICU to 2000 on 4/24. He was volume overloaded and diuresed.His pericardial tube remained for a couple of days. The dark bloody output continued to decrease. The tube was then removed. Chest x ray sremained stable. He was re started on low dose Coumadin. INR goal is 2. He was also on low dose ecasa. Per Dr. Donata Clay, he is NOT to be given lovenox. He has been tolerating a diet and is ambulating well on room air. He has volume overload and has been diuresed with Lasix IV. Will continue Lasix 40 orally bid upon discharge.As discussed with Dr. Laneta Simmers, patient is felt surgically stable for discharge today.   Latest Vital Signs: Blood pressure 105/62, pulse 78, temperature 97.5 F (36.4 C), temperature source Oral, resp. rate 18, height 5\' 10"  (1.778 m), weight 102.468 kg (225 lb 14.4 oz), SpO2 98.00%.  Physical Exam: Cardiovascular: RRR  Pulmonary: Slightly diminished at bases  Abdomen: Soft, non tender, bowel  sounds present.  Extremities: Bilateral lower extremity edema.  Wound: Clean and dry. No signs of infection. 2 eschars removed from previous chest  tube sites. Slight bloody ooze afterward.    Discharge Condition:Stable  Recent laboratory studies:  Lab Results  Component Value Date   WBC 5.2 12/22/2012   HGB 7.8* 12/22/2012   HCT 23.7* 12/22/2012   MCV 99.6 12/22/2012   PLT 185 12/22/2012   Lab Results  Component Value Date   NA 140 12/22/2012   K 3.9 12/22/2012   CL 103 12/22/2012   CO2 31 12/22/2012   CREATININE 0.60 12/22/2012   GLUCOSE 114* 12/22/2012      Diagnostic Studies:  Dg Chest Port 1 View  12/21/2012  *RADIOLOGY REPORT*  Clinical Data: Status post pericardial window  PORTABLE CHEST - 1 VIEW  Comparison: Prior chest x-ray 12/20/2012  Findings: Right IJ central venous catheter remains in unchanged position with the tip in the mid superior vena cava.  Stable cardiomegaly.  Small, left greater than right layering pleural effusions and associated bibasilar opacities persist.  No pneumothorax.  Mild vascular congestion without overt edema.  IMPRESSION:  1.  Persistent left greater than right layering effusions and associated bibasilar atelectasis 2.  Stable enlargement of the cardiopericardial silhouette 3.  Mild pulmonary vascular congestion without overt edema   Original Report Authenticated By: Malachy Moan, M.D.   12/05/2012  *RADIOLOGY REPORT*  Clinical Data: Postop CABG  PORTABLE CHEST - 1 VIEW  Comparison: 12/04/2012  Findings: Interval removal of right IJ Swan-Ganz venous catheter. Stable right IJ venous sheath.  Stable left chest tube and mediastinal drain.  No pneumothorax is seen.  Mild bibasilar atelectasis.  Mild cardiomegaly. Postsurgical changes related to prior CABG.  IMPRESSION: Stable left chest tube and mediastinal drain.  No pneumothorax is seen.   Original Report Authenticated By: Charline Bills, M.D.          Future Appointments Provider Department Dept Phone   01/02/2013 2:00 PM Kerin Perna, MD [facility] [phone]      Discharge Medications:    Medication List    STOP taking these medications       enoxaparin 150 MG/ML injection  Commonly known as:  LOVENOX      TAKE these medications       aspirin 81 MG EC tablet  Take 1 tablet (81 mg total) by mouth daily.     atorvastatin 40 MG tablet  Commonly known as:  LIPITOR  Take 20 mg by mouth at bedtime.     budesonide-formoterol 160-4.5 MCG/ACT inhaler  Commonly known as:  SYMBICORT  Inhale 2 puffs into the lungs 2 (two) times daily.     cyclobenzaprine 10 MG tablet  Commonly known as:  FLEXERIL  Take 10 mg by mouth 3 (three) times daily as needed for muscle spasms. For mucle spasms     digoxin 0.25 MG tablet  Commonly known as:  LANOXIN  Take 1 tablet (0.25 mg total) by mouth daily.     doxycycline 50 MG capsule  Commonly known as:  VIBRAMYCIN  Take 2 capsules (100 mg total) by mouth 2 (two) times daily.     DSS 100 MG Caps  Take 200 mg by mouth 2 (two) times daily as needed for constipation.     ferrous fumarate-b12-vitamic C-folic acid capsule  Commonly known as:  TRINSICON / FOLTRIN  Take 1 capsule by mouth 3 (three) times daily after meals.     furosemide  40 MG tablet  Commonly known as:  LASIX  Take 1 tablet (40 mg total) by mouth 2 (two) times daily.     gabapentin 800 MG tablet  Commonly known as:  NEURONTIN  Take 800 mg by mouth 3 (three) times daily.     insulin aspart 100 UNIT/ML injection  Commonly known as:  novoLOG  Inject 15 Units into the skin 3 (three) times daily before meals.     insulin detemir 100 UNIT/ML injection  Commonly known as:  LEVEMIR  Inject 45-60 Units into the skin 2 (two) times daily. Takes 60 units in the am & 45 units in the pm     levalbuterol 45 MCG/ACT inhaler  Commonly known as:  XOPENEX HFA  Inhale 2 puffs into the lungs every 6 (six) hours.     metoprolol tartrate 25 MG tablet  Commonly known as:  LOPRESSOR  Take 1.5 tablets (37.5 mg total) by mouth 2 (two) times daily.     Potassium Chloride ER 20 MEQ  Tbcr  Take 20 mEq by mouth 2 (two) times daily.     sertraline 100 MG tablet  Commonly known as:  ZOLOFT  Take 50 mg by mouth daily.     traMADol 50 MG tablet  Commonly known as:  ULTRAM  Take 1-2 tablets (50-100 mg total) by mouth every 6 (six) hours as needed for pain.     warfarin 2 MG tablet  Commonly known as:  COUMADIN  Take 1 tablet (2 mg total) by mouth daily. Or as directed by Dr. Landry Dyke office        Follow Up Appointments: Follow-up Information   Follow up with VAN Dinah Beers, MD. (PA/LAT CXR to be taken  (at [facility] which is in the same building as Dr. Zenaida Niece Trigt's office) on 01/02/2013 at 1:00 pm;Appointment with Dr. Donata Clay is on 01/02/2013 at 2:00 pm)    Contact information:   [address] [employer] [address] [place] [address] [phone]       Follow up with Lennette Bihari, MD. (Call for a follow up appointment 2 weeks)    Contact information:   [address] [place] [address] [phone]       Follow up with [facility] . (Home Health to draw a PT and INR on Tuesday 12/25/2012 and fax or call results to Dr. Landry Dyke office)    Contact information:   [phone]      Signed: Doree Fudge MPA-C 12/23/2012, 11:02 AM

## 2012-12-21 NOTE — Progress Notes (Signed)
[facility]  DAILY PROGRESS NOTE  Patient ID: Dylan Church, male   DOB: 05-Dec-1950, 62 y.o.   MRN: [id number]  Subjective:  Considerably better-  Sitting up with no active complaints. Denies pain or dyspnea. BP & HR stable Hemorrhagic fluid in CT clearing rapidly - plan for d/c CT today..  Objective:  Temp:  [97.5 F (36.4 C)-98 F (36.7 C)] 98 F (36.7 C) (04/25 1318) Pulse Rate:  [84-95] 85 (04/25 1318) Resp:  [18] 18 (04/25 1318) BP: (100-112)/(52-72) 107/65 mmHg (04/25 1318) SpO2:  [93 %-100 %] 93 % (04/25 1544) Weight change:   Intake/Output from previous day: 04/24 0701 - 04/25 0700 In: 690 [P.O.:480; I.V.:110; IV Piggyback:100] Out: 1840 [Urine:1800; Chest Tube:40]  Intake/Output from this shift: Total I/O In: 240 [P.O.:240] Out: 1650 [Urine:1650]  Medications: Current Facility-Administered Medications  Medication Dose Route Frequency Provider Last Rate Last Dose  . 0.9 % NaCl with KCl 20 mEq/ L  infusion   Intravenous Continuous Kerin Perna, MD 20 mL/hr at 12/20/12 1100    . aspirin EC tablet 81 mg  81 mg Oral Daily Donielle Margaretann Loveless, PA-C   81 mg at 12/21/12 1103  . atorvastatin (LIPITOR) tablet 20 mg  20 mg Oral QHS Ripudeep K Rai, MD   20 mg at 12/20/12 2147  . bisacodyl (DULCOLAX) EC tablet 10 mg  10 mg Oral Daily Ardelle Balls, PA-C   10 mg at 12/20/12 5366  . budesonide-formoterol (SYMBICORT) 160-4.5 MCG/ACT inhaler 2 puff  2 puff Inhalation BID Ripudeep Jenna Luo, MD   2 puff at 12/21/12 0931  . digoxin (LANOXIN) tablet 0.25 mg  0.25 mg Oral Daily Ripudeep K Rai, MD   0.25 mg at 12/21/12 1103  . doxycycline (VIBRA-TABS) tablet 100 mg  100 mg Oral Q12H Donielle Margaretann Loveless, PA-C   100 mg at 12/21/12 1104  . fentaNYL (SUBLIMAZE) injection 25-50 mcg  25-50 mcg Intravenous Q2H PRN Ardelle Balls, PA-C   50 mcg at 12/20/12 0345  . ferrous fumarate-b12-vitamic C-folic acid (TRINSICON / FOLTRIN) capsule 1 capsule  1  capsule Oral TID PC Kerin Perna, MD   1 capsule at 12/21/12 1529  . furosemide (LASIX) injection 40 mg  40 mg Intravenous Daily Kerin Perna, MD   40 mg at 12/21/12 1310  . gabapentin (NEURONTIN) capsule 800 mg  800 mg Oral TID Ripudeep Jenna Luo, MD   800 mg at 12/21/12 1529  . insulin aspart (novoLOG) injection 0-24 Units  0-24 Units Subcutaneous Q4H Ardelle Balls, PA-C   2 Units at 12/21/12 0016  . insulin detemir (LEVEMIR) injection 15 Units  15 Units Subcutaneous BID Ardelle Balls, PA-C   15 Units at 12/21/12 1106  . levalbuterol [facility] HFA) inhaler 2 puff  2 puff Inhalation Q6H Kerin Perna, MD   2 puff at 12/21/12 1543  . metoprolol tartrate (LOPRESSOR) tablet 25 mg  25 mg Oral BID Ardelle Balls, PA-C   25 mg at 12/21/12 1104  . ondansetron (ZOFRAN) injection 4 mg  4 mg Intravenous Q6H PRN Donielle Margaretann Loveless, PA-C      . oxyCODONE-acetaminophen (PERCOCET/ROXICET) 5-325 MG per tablet 1-2 tablet  1-2 tablet Oral Q4H PRN Ardelle Balls, PA-C   2 tablet at 12/21/12 1156  . pantoprazole (PROTONIX) EC tablet 40 mg  40 mg Oral Daily Donielle Margaretann Loveless, PA-C   40 mg at 12/21/12 1200  . senna-docusate (Senokot-S) tablet 1 tablet  1 tablet Oral QHS PRN Ardelle Balls, PA-C      . sertraline (ZOLOFT) tablet 50 mg  50 mg Oral Daily Ripudeep Jenna Luo, MD   50 mg at 12/21/12 1103  . sodium chloride 0.9 % injection 10 mL  10 mL Intravenous Q12H Kerin Perna, MD   10 mL at 12/21/12 1105  . sodium chloride 0.9 % injection 10-40 mL  10-40 mL Intracatheter PRN Ripudeep Jenna Luo, MD   20 mL at 12/21/12 1541  . sodium chloride 0.9 % injection 3 mL  3 mL Intravenous Q12H Kerin Perna, MD   3 mL at 12/21/12 1105  . traMADol (ULTRAM) tablet 50-100 mg  50-100 mg Oral Q6H PRN Ardelle Balls, PA-C   100 mg at 12/20/12 1022  . warfarin (COUMADIN) tablet 2.5 mg  2.5 mg Oral q1800 Ardelle Balls, PA-C   2.5 mg at 12/20/12 1721  . Warfarin - Physician Dosing  Inpatient   Does not apply q1800 Kerin Perna, MD        Physical Exam: General appearance: alert, cooperative and no distress; feels well Neck: no carotid bruit, no JVD, supple, trachea midline and thyroid not enlarged, symmetric, no tenderness/mass/nodules Lungs: diminished breath sounds bibasilar with mild interstitial crackles; non-labored Heart: regular rate and rhythm, S1, S2 normal, no murmur, click, rub or gallop Abdomen: soft, non-tender; bowel sounds normal; no masses,  no organomegaly Extremities: extremities normal, atraumatic, no cyanosis or edema Pulses: 2+ and symmetric Skin: Skin color, texture, turgor normal. No rashes or lesions Neurologic: Grossly normal  Lab Results: PROTIME-INR     Status: Abnormal   Collection Time    12/21/12  5:05 AM      Result Value Range   Prothrombin Time 17.8 (*) 11.6 - 15.2 seconds   INR 1.51 (*) 0.00 - 1.49  GLUCOSE, CAPILLARY     Status: Abnormal   Collection Time    12/21/12 11:31 AM      Result Value Range   Glucose-Capillary 117 (*) 70 - 99 mg/dL   Comment 1 Notify RN     Comment 2 Documented in Chart    GLUCOSE, CAPILLARY     Status: Abnormal   Collection Time    12/21/12  4:16 PM      Result Value Range   Glucose-Capillary 121 (*) 70 - 99 mg/dL   Comment 1 Documented in Chart     Comment 2 Notify RN      Imaging: Dg Chest Port 1 View  12/21/2012  *RADIOLOGY REPORT*  Clinical Data: Status post pericardial window  PORTABLE CHEST - 1 VIEW  Comparison: Prior chest x-ray 12/20/2012  Findings: Right IJ central venous catheter remains in unchanged position with the tip in the mid superior vena cava.  Stable cardiomegaly.  Small, left greater than right layering pleural effusions and associated bibasilar opacities persist.  No pneumothorax.  Mild vascular congestion without overt edema.  IMPRESSION:  1.  Persistent left greater than right layering effusions and associated bibasilar atelectasis 2.  Stable enlargement of the  cardiopericardial silhouette 3.  Mild pulmonary vascular congestion without overt edema   Original Report Authenticated By: Malachy Moan, M.D.    Dg Chest Port 1 View  12/20/2012  *RADIOLOGY REPORT*  Clinical Data: Chest tube placement.  PORTABLE CHEST - 1 VIEW  Comparison: One-view chest 12/19/2012.  Findings: The right IJ line is stable in position, within the SVC. The heart size is exaggerated by low lung volumes.  A left pleural effusion is stable.  Mild bibasilar atelectasis is unchanged.  The mediastinal drain seen on the prior study may be below the field of view.  No chest tube is identified.  IMPRESSION:  1.  Stable left pleural effusion and bibasilar atelectasis. 2.  Low lung volumes. 3.  The previously noted mediastinal drain the is likely below the field of view on this study.   Original Report Authenticated By: Marin Roberts, M.D.     Assessment:  Principal Problem:   Syncope Active Problems:   CAD h/o PCI,  progression 11/29/12- CABG - X3 12/03/12   DIABETES MELLITUS   HYPERLIPIDEMIA   HYPERTENSION   BACK PAIN, CHRONIC- followed at pain clinic, he has an implantable MSO4 pump   Agent orange exposure- on disabilty from. [place] Vet '69-70   Diastolic dysfunction grade 1 with an EF 60-65% 2D 11/30/12   Pulmonary embolism post CABG   Warfarin-induced coagulopathy- reversed   Hemorrhagic pericardial effusion   Plan:   Pericardial tamponade resolved -- CT out today.  As per Dr. Royann Shivers Resume anticoagulation slowly, without heparin/LMWH, allow warfarin to gradually take effect.  Anemia -- per Primary Svc; No plan to transfuse at this point, as he is relatively asymptomatic. Iron supplementation.  Continues to diurese well - on IV Lasix   BP & HR stable on low dose BB, digoxin  On statin   Time Spent Directly with Patient:  20 minutes  Length of Stay:  LOS: 3 days    HARDING,DAVID W 12/21/2012, 5:27 PM

## 2012-12-22 ENCOUNTER — Inpatient Hospital Stay (HOSPITAL_COMMUNITY): Payer: Non-veteran care

## 2012-12-22 LAB — CBC
HCT: 23.7 % — ABNORMAL LOW (ref 39.0–52.0)
Hemoglobin: 7.8 g/dL — ABNORMAL LOW (ref 13.0–17.0)
MCH: 32.8 pg (ref 26.0–34.0)
MCHC: 32.9 g/dL (ref 30.0–36.0)
MCV: 99.6 fL (ref 78.0–100.0)
Platelets: 185 10*3/uL (ref 150–400)
RBC: 2.38 MIL/uL — ABNORMAL LOW (ref 4.22–5.81)
RDW: 16 % — ABNORMAL HIGH (ref 11.5–15.5)
WBC: 5.2 10*3/uL (ref 4.0–10.5)

## 2012-12-22 LAB — BASIC METABOLIC PANEL
BUN: 10 mg/dL (ref 6–23)
CO2: 31 mEq/L (ref 19–32)
Calcium: 8.7 mg/dL (ref 8.4–10.5)
Chloride: 103 mEq/L (ref 96–112)
Creatinine, Ser: 0.6 mg/dL (ref 0.50–1.35)
GFR calc Af Amer: >90 mL/min (ref >90)
GFR calc non Af Amer: >90 mL/min (ref >90)
Glucose, Bld: 114 mg/dL — ABNORMAL HIGH (ref 70–99)
Potassium: 3.9 mEq/L (ref 3.5–5.1)
Sodium: 140 mEq/L (ref 135–145)

## 2012-12-22 LAB — TYPE AND SCREEN
ABO/RH(D): A POS
Antibody Screen: NEGATIVE
Unit division: 0
Unit division: 0

## 2012-12-22 LAB — GLUCOSE, CAPILLARY
Glucose-Capillary: 101 mg/dL — ABNORMAL HIGH (ref 70–99)
Glucose-Capillary: 134 mg/dL — ABNORMAL HIGH (ref 70–99)
Glucose-Capillary: 117 mg/dL — ABNORMAL HIGH (ref 70–99)
Glucose-Capillary: 133 mg/dL — ABNORMAL HIGH (ref 70–99)

## 2012-12-22 LAB — PROTIME-INR
INR: 1.73 — ABNORMAL HIGH (ref 0.00–1.49)
Prothrombin Time: 19.7 seconds — ABNORMAL HIGH (ref 11.6–15.2)

## 2012-12-22 LAB — BODY FLUID CULTURE

## 2012-12-22 LAB — TISSUE CULTURE: Culture: NO GROWTH

## 2012-12-22 MED ORDER — POTASSIUM CHLORIDE CRYS ER 20 MEQ PO TBCR
EXTENDED_RELEASE_TABLET | ORAL | Status: AC
  Administered 2012-12-22: 20 meq
  Filled 2012-12-22: qty 2

## 2012-12-22 MED ORDER — WARFARIN SODIUM 2 MG PO TABS
2.0000 mg | ORAL_TABLET | Freq: Every day | ORAL | Status: DC
  Administered 2012-12-22: 2 mg via ORAL
  Filled 2012-12-22 (×2): qty 1

## 2012-12-22 MED ORDER — METOPROLOL TARTRATE 25 MG PO TABS
37.5000 mg | ORAL_TABLET | Freq: Two times a day (BID) | ORAL | Status: DC
  Administered 2012-12-22 – 2012-12-23 (×2): 37.5 mg via ORAL
  Filled 2012-12-22 (×3): qty 1

## 2012-12-22 MED ORDER — POTASSIUM CHLORIDE CRYS ER 20 MEQ PO TBCR
30.0000 meq | EXTENDED_RELEASE_TABLET | Freq: Once | ORAL | Status: AC
  Administered 2012-12-22: 30 meq via ORAL
  Filled 2012-12-22: qty 1

## 2012-12-22 NOTE — Progress Notes (Addendum)
                   [address]            [place] [address]          [phone]      3 Days Post-Op Procedure(s) (LRB): SUBXYPHOID PERICARDIAL WINDOW (N/A)  Subjective: Patient walked over 900 feet last evening. Continues to feel better  Objective: Vital signs in last 24 hours: Temp:  [97.5 F (36.4 C)-98.2 F (36.8 C)] 97.5 F (36.4 C) (04/26 0625) Pulse Rate:  [84-96] 96 (04/26 0625) Cardiac Rhythm:  [-] Normal sinus rhythm (04/25 2000) Resp:  [18] 18 (04/26 0625) BP: (97-111)/(57-72) 111/57 mmHg (04/26 0625) SpO2:  [91 %-100 %] 97 % (04/26 0845)   Current Weight  12/20/12 102.468 kg (225 lb 14.4 oz)       Intake/Output from previous day: 04/25 0701 - 04/26 0700 In: 240 [P.O.:240] Out: 2300 [Urine:2300]   Physical Exam:  Cardiovascular: RRR Pulmonary: Slightly diminished at bases Abdomen: Soft, non tender, bowel sounds present. Extremities: Bilateral lower extremity edema. Wound: Dressing is clean and dry.    Lab Results: CBC:  Recent Labs  12/21/12 0505 12/22/12 0504  WBC 6.9 5.2  HGB 7.8* 7.8*  HCT 24.3* 23.7*  PLT 173 185   BMET:   Recent Labs  12/21/12 0505 12/22/12 0504  NA 137 140  K 3.5 3.9  CL 101 103  CO2 31 31  GLUCOSE 83 114*  BUN 12 10  CREATININE 0.69 0.60  CALCIUM 8.7 8.7    PT/INR:  Lab Results  Component Value Date   INR 1.73* 12/22/2012   INR 1.51* 12/21/2012   INR 1.27 12/19/2012   ABG:  INR: Will add last result for INR, ABG once components are confirmed Will add last 4 CBG results once components are confirmed  Assessment/Plan:  1. CV - SR. On Lopressor 25 bid, Digoxin 0.25 daily, low dose Coumadin. NO lovenox. INR increased from 1.51 to 1.73. Will give 2 mg tonight. Will increase Lopressor to 37.5 bid (as taken pre op) for better HR control. 2.  Pulmonary - Pericardial tube removed yesterday.CXR this am appears to show improvement in aeration of LLL, small bilateral pleural effusions, and  no pneumothorax. 3. Volume Overload - Continue Lasix 40 IV today as has had excellent diuresis 4.  Acute blood loss anemia - H and H down to 7.8 and 23.7. Continue Ferrous. Monitor need for transfusion 5.DM-CBGS 121/121/101. Continue with Insulin 6.Supplement potassium 7.Possible discharge 1-2 days  ZIMMERMAN,DONIELLE MPA-C 12/22/2012,9:00 AM     Chart reviewed, patient examined, agree with above.

## 2012-12-22 NOTE — Progress Notes (Addendum)
I have seen and evaluated the patient this AM along with Boyce Medici, PA. I agree with her findings, examination as well as impression recommendations.  Looks good, happy to have CT out.  Feeling more like himself. Has had a rather tumultuous course.  Agree with increasing BB dose as BP tolerates.  Continuing gentle diuresis -- may be ready to convert to PO lasix in a couple of days, but continues to have ~2+ tense pitting edema in legs.  INR slowly climbing -- anticipate therapeutic in 1-2 days to allow for d/c.  Otherwise seems to be doing well.   Will follow along.  Marykay Lex, M.D., M.S. [facility] [address] [place], [place]  [address]  [phone] Pager # [phone] 12/22/2012 10:19 AM

## 2012-12-22 NOTE — Progress Notes (Signed)
CARDIAC REHAB PHASE I   PRE:  Rate/Rhythm: 91SR  BP:  Supine:   Sitting: 112/64  Standing:    SaO2: 94RA  MODE:  Ambulation: 1050 ft   POST:  Rate/Rhythem: 112ST  BP:  Supine:   Sitting: 138/60  Standing:    SaO2: 96RA 1430-1513 Pt ambulated well with walker and minimal assistance. Pt stated that he uses the walker because he feels comfortable using it and has one at home. Pt very eager to move and be physically active once he is home. Returned pt to side of bed with call light and phone within reach.  Deetta Perla

## 2012-12-22 NOTE — Progress Notes (Signed)
[facility]  Subjective: No further presyncope/syncope. He denies CP/SOB.   Objective: Vital signs in last 24 hours: Temp:  [97.5 F (36.4 C)-98.2 F (36.8 C)] 97.5 F (36.4 C) (04/26 0625) Pulse Rate:  [84-96] 96 (04/26 0625) Resp:  [18] 18 (04/26 0625) BP: (97-111)/(57-72) 111/57 mmHg (04/26 0625) SpO2:  [91 %-100 %] 97 % (04/26 0845) Last BM Date: 12/18/12 (pre-op)  Intake/Output from previous day: 04/25 0701 - 04/26 0700 In: 240 [P.O.:240] Out: 2300 [Urine:2300] Intake/Output this shift:    Medications Current Facility-Administered Medications  Medication Dose Route Frequency Provider Last Rate Last Dose  . 0.9 % NaCl with KCl 20 mEq/ L  infusion   Intravenous Continuous Kerin Perna, MD 20 mL/hr at 12/20/12 1100    . aspirin EC tablet 81 mg  81 mg Oral Daily Donielle Margaretann Loveless, PA-C   81 mg at 12/21/12 1103  . atorvastatin (LIPITOR) tablet 20 mg  20 mg Oral QHS Ripudeep K Rai, MD   20 mg at 12/21/12 2230  . bisacodyl (DULCOLAX) EC tablet 10 mg  10 mg Oral Daily Ardelle Balls, PA-C   10 mg at 12/20/12 1610  . budesonide-formoterol (SYMBICORT) 160-4.5 MCG/ACT inhaler 2 puff  2 puff Inhalation BID Ripudeep Jenna Luo, MD   2 puff at 12/22/12 0844  . digoxin (LANOXIN) tablet 0.25 mg  0.25 mg Oral Daily Ripudeep K Rai, MD   0.25 mg at 12/21/12 1103  . doxycycline (VIBRA-TABS) tablet 100 mg  100 mg Oral Q12H Donielle Margaretann Loveless, PA-C   100 mg at 12/21/12 2230  . fentaNYL (SUBLIMAZE) injection 25-50 mcg  25-50 mcg Intravenous Q2H PRN Ardelle Balls, PA-C   50 mcg at 12/20/12 0345  . ferrous fumarate-b12-vitamic C-folic acid (TRINSICON / FOLTRIN) capsule 1 capsule  1 capsule Oral TID PC Kerin Perna, MD   1 capsule at 12/21/12 1830  . furosemide (LASIX) injection 40 mg  40 mg Intravenous Daily Kerin Perna, MD   40 mg at 12/21/12 1310  . gabapentin (NEURONTIN) capsule 800 mg  800 mg Oral TID Ripudeep Jenna Luo, MD   800 mg at 12/21/12  2230  . insulin aspart (novoLOG) injection 0-24 Units  0-24 Units Subcutaneous TID AC & HS Kerin Perna, MD      . insulin detemir (LEVEMIR) injection 15 Units  15 Units Subcutaneous BID Ardelle Balls, PA-C   15 Units at 12/21/12 2231  . levalbuterol [facility] HFA) inhaler 2 puff  2 puff Inhalation Q6H Kerin Perna, MD   2 puff at 12/22/12 0845  . metoprolol tartrate (LOPRESSOR) tablet 25 mg  25 mg Oral BID Ardelle Balls, PA-C   25 mg at 12/21/12 1104  . ondansetron (ZOFRAN) injection 4 mg  4 mg Intravenous Q6H PRN Donielle Margaretann Loveless, PA-C      . oxyCODONE-acetaminophen (PERCOCET/ROXICET) 5-325 MG per tablet 1-2 tablet  1-2 tablet Oral Q4H PRN Ardelle Balls, PA-C   2 tablet at 12/21/12 1156  . pantoprazole (PROTONIX) EC tablet 40 mg  40 mg Oral Daily Donielle Margaretann Loveless, PA-C   40 mg at 12/21/12 1200  . senna-docusate (Senokot-S) tablet 1 tablet  1 tablet Oral QHS PRN Ardelle Balls, PA-C      . sertraline (ZOLOFT) tablet 50 mg  50 mg Oral Daily Ripudeep Jenna Luo, MD   50 mg at 12/21/12 1103  . sodium chloride 0.9 % injection 10 mL  10 mL Intravenous  Q12H Kerin Perna, MD   10 mL at 12/21/12 2234  . sodium chloride 0.9 % injection 10-40 mL  10-40 mL Intracatheter PRN Ripudeep Jenna Luo, MD   20 mL at 12/22/12 0818  . sodium chloride 0.9 % injection 3 mL  3 mL Intravenous Q12H Kerin Perna, MD   3 mL at 12/21/12 2200  . traMADol (ULTRAM) tablet 50-100 mg  50-100 mg Oral Q6H PRN Ardelle Balls, PA-C   100 mg at 12/20/12 1022  . warfarin (COUMADIN) tablet 2.5 mg  2.5 mg Oral q1800 Ardelle Balls, PA-C   2.5 mg at 12/21/12 1830  . Warfarin - Physician Dosing Inpatient   Does not apply q1800 Kerin Perna, MD        PE: General appearance: alert, cooperative and no distress Lungs: clear to auscultation bilaterally Heart: regular rate and rhythm Extremities: trace bilateral LEE Pulses: 2+ and symmetric Skin: warm and dry Neurologic: Grossly  normal  Lab Results:   Recent Labs  12/20/12 0340 12/21/12 0505 12/22/12 0504  WBC 6.8 6.9 5.2  HGB 7.7* 7.8* 7.8*  HCT 23.4* 24.3* 23.7*  PLT 182 173 185   BMET  Recent Labs  12/20/12 0340 12/21/12 0505 12/22/12 0504  NA 137 137 140  K 4.1 3.5 3.9  CL 102 101 103  CO2 29 31 31   GLUCOSE 110* 83 114*  BUN 21 12 10   CREATININE 0.74 0.69 0.60  CALCIUM 8.6 8.7 8.7   PT/INR  Recent Labs  12/21/12 0505 12/22/12 0504  LABPROT 17.8* 19.7*  INR 1.51* 1.73*     Assessment/Plan  Principal Problem:   Syncope Active Problems:   DIABETES MELLITUS   HYPERLIPIDEMIA   HYPERTENSION   BACK PAIN, CHRONIC- followed at pain clinic, he has an implantable MSO4 pump   CAD h/o PCI,  progression 11/29/12- CABG - X3 12/03/12   Agent orange exposure- on disabilty from. [place] Vet '69-70   Diastolic dysfunction grade 1 with an EF 60-65% 2D 11/30/12   Pulmonary embolism post CABG   Warfarin-induced coagulopathy- reversed   Hemorrhagic pericardial effusion  Plan: S/P subxyphoid pericardial window to drain pericardial effusion. POD# 3. Chest tube was removed yesterday. No further presyncope/syncope. He denies CP/SOB. He was diagnosed with a PE several weeks ago. Will need to resume anticoagulation slowly, without heparin/LMWH, allow warfarin to gradually take effect. BP stable. HR is a little tachycardic in the low 100s. May consider increasing BB. He is currently on 25 mg of Lopressor BID. Will continue to monitor.     LOS: 4 days    Dylan Church 12/22/2012 8:54 AM

## 2012-12-23 ENCOUNTER — Other Ambulatory Visit: Payer: Self-pay | Admitting: Cardiology

## 2012-12-23 DIAGNOSIS — I313 Pericardial effusion (noninflammatory): Secondary | ICD-10-CM

## 2012-12-23 LAB — GLUCOSE, CAPILLARY

## 2012-12-23 LAB — PROTIME-INR
INR: 1.83 — ABNORMAL HIGH (ref 0.00–1.49)
Prothrombin Time: 20.5 seconds — ABNORMAL HIGH (ref 11.6–15.2)

## 2012-12-23 MED ORDER — FUROSEMIDE 40 MG PO TABS: 40.0000 mg | ORAL_TABLET | Freq: Two times a day (BID) | ORAL | Status: AC

## 2012-12-23 MED ORDER — TRAMADOL HCL 50 MG PO TABS: 50.0000 mg | ORAL_TABLET | Freq: Four times a day (QID) | ORAL | Status: DC | PRN

## 2012-12-23 MED ORDER — WARFARIN SODIUM 2 MG PO TABS: 2.0000 mg | ORAL_TABLET | Freq: Every day | ORAL | Status: DC

## 2012-12-23 MED ORDER — WARFARIN SODIUM 1 MG PO TABS
1.0000 mg | ORAL_TABLET | ORAL | Status: DC
  Filled 2012-12-23: qty 1

## 2012-12-23 MED ORDER — FE FUMARATE-B12-VIT C-FA-IFC PO CAPS: 1.0000 | ORAL_CAPSULE | Freq: Three times a day (TID) | ORAL | Status: AC

## 2012-12-23 MED ORDER — POTASSIUM CHLORIDE ER 20 MEQ PO TBCR: 20.0000 meq | EXTENDED_RELEASE_TABLET | Freq: Two times a day (BID) | ORAL | Status: AC

## 2012-12-23 NOTE — Progress Notes (Signed)
[facility]  Subjective: Doing well. No complaints. Ready for discharge later today.  Objective: Vital signs in last 24 hours: Temp:  [97.5 F (36.4 C)-97.8 F (36.6 C)] 97.5 F (36.4 C) (04/27 0457) Pulse Rate:  [78-90] 78 (04/27 0457) Resp:  [16-18] 18 (04/27 0457) BP: (95-105)/(46-70) 105/62 mmHg (04/27 0457) SpO2:  [95 %-98 %] 98 % (04/27 0457) Last BM Date: 12/18/12  Intake/Output from previous day: 04/26 0701 - 04/27 0700 In: 480 [P.O.:480] Out: 2100 [Urine:2100] Intake/Output this shift:    Medications Current Facility-Administered Medications  Medication Dose Route Frequency Provider Last Rate Last Dose  . 0.9 % NaCl with KCl 20 mEq/ L  infusion   Intravenous Continuous Kerin Perna, MD 20 mL/hr at 12/20/12 1100    . aspirin EC tablet 81 mg  81 mg Oral Daily Ardelle Balls, PA-C   81 mg at 12/23/12 0951  . atorvastatin (LIPITOR) tablet 20 mg  20 mg Oral QHS Ripudeep K Rai, MD   20 mg at 12/22/12 2224  . bisacodyl (DULCOLAX) EC tablet 10 mg  10 mg Oral Daily Ardelle Balls, PA-C   10 mg at 12/20/12 1610  . budesonide-formoterol (SYMBICORT) 160-4.5 MCG/ACT inhaler 2 puff  2 puff Inhalation BID Ripudeep Jenna Luo, MD   2 puff at 12/22/12 1958  . digoxin (LANOXIN) tablet 0.25 mg  0.25 mg Oral Daily Ripudeep K Rai, MD   0.25 mg at 12/23/12 0951  . doxycycline (VIBRA-TABS) tablet 100 mg  100 mg Oral Q12H Donielle Margaretann Loveless, PA-C   100 mg at 12/23/12 0951  . fentaNYL (SUBLIMAZE) injection 25-50 mcg  25-50 mcg Intravenous Q2H PRN Ardelle Balls, PA-C   50 mcg at 12/20/12 0345  . ferrous fumarate-b12-vitamic C-folic acid (TRINSICON / FOLTRIN) capsule 1 capsule  1 capsule Oral TID PC Kerin Perna, MD   1 capsule at 12/23/12 0953  . furosemide (LASIX) injection 40 mg  40 mg Intravenous Daily Kerin Perna, MD   40 mg at 12/23/12 0955  . gabapentin (NEURONTIN) capsule 800 mg  800 mg Oral TID Ripudeep Jenna Luo, MD   800 mg at  12/23/12 0951  . insulin aspart (novoLOG) injection 0-24 Units  0-24 Units Subcutaneous TID AC & HS Kerin Perna, MD   2 Units at 12/22/12 2222  . insulin detemir (LEVEMIR) injection 15 Units  15 Units Subcutaneous BID Ardelle Balls, PA-C   15 Units at 12/23/12 0954  . levalbuterol [facility] HFA) inhaler 2 puff  2 puff Inhalation Q6H Kerin Perna, MD   2 puff at 12/22/12 1958  . metoprolol tartrate (LOPRESSOR) tablet 37.5 mg  37.5 mg Oral BID Ardelle Balls, PA-C   37.5 mg at 12/23/12 9604  . ondansetron (ZOFRAN) injection 4 mg  4 mg Intravenous Q6H PRN Donielle Margaretann Loveless, PA-C      . oxyCODONE-acetaminophen (PERCOCET/ROXICET) 5-325 MG per tablet 1-2 tablet  1-2 tablet Oral Q4H PRN Ardelle Balls, PA-C   2 tablet at 12/21/12 1156  . pantoprazole (PROTONIX) EC tablet 40 mg  40 mg Oral Daily Donielle Margaretann Loveless, PA-C   40 mg at 12/23/12 0954  . senna-docusate (Senokot-S) tablet 1 tablet  1 tablet Oral QHS PRN Ardelle Balls, PA-C      . sertraline (ZOLOFT) tablet 50 mg  50 mg Oral Daily Ripudeep Jenna Luo, MD   50 mg at 12/23/12 0951  . sodium chloride 0.9 % injection 10 mL  10 mL Intravenous Q12H Kerin Perna, MD   10 mL at 12/22/12 1100  . sodium chloride 0.9 % injection 10-40 mL  10-40 mL Intracatheter PRN Ripudeep Jenna Luo, MD   10 mL at 12/22/12 2048  . sodium chloride 0.9 % injection 3 mL  3 mL Intravenous Q12H Kerin Perna, MD   3 mL at 12/22/12 2224  . traMADol (ULTRAM) tablet 50-100 mg  50-100 mg Oral Q6H PRN Ardelle Balls, PA-C   100 mg at 12/20/12 1022  . warfarin (COUMADIN) tablet 2 mg  2 mg Oral q1800 Ardelle Balls, PA-C   2 mg at 12/22/12 1846  . Warfarin - Physician Dosing Inpatient   Does not apply q1800 Kerin Perna, MD        PE: General appearance: alert, cooperative and no distress Lungs: clear to auscultation bilaterally Heart: regular rate and rhythm Extremities: 1+ LEE Pulses: 2+ and symmetric Skin: warm and  dry Neurologic: Grossly normal  Lab Results:   Recent Labs  12/21/12 0505 12/22/12 0504  WBC 6.9 5.2  HGB 7.8* 7.8*  HCT 24.3* 23.7*  PLT 173 185   BMET  Recent Labs  12/21/12 0505 12/22/12 0504  NA 137 140  K 3.5 3.9  CL 101 103  CO2 31 31  GLUCOSE 83 114*  BUN 12 10  CREATININE 0.69 0.60  CALCIUM 8.7 8.7   PT/INR  Recent Labs  12/21/12 0505 12/22/12 0504 12/23/12 0610  LABPROT 17.8* 19.7* 20.5*  INR 1.51* 1.73* 1.83*    Assessment/Plan  Principal Problem:   Syncope Active Problems:   DIABETES MELLITUS   HYPERLIPIDEMIA   HYPERTENSION   BACK PAIN, CHRONIC- followed at pain clinic, he has an implantable MSO4 pump   CAD h/o PCI,  progression 11/29/12- CABG - X3 12/03/12   Agent orange exposure- on disabilty from. [place] Vet '69-70   Diastolic dysfunction grade 1 with an EF 60-65% 2D 11/30/12   Pulmonary embolism post CABG   Warfarin-induced coagulopathy- reversed   Hemorrhagic pericardial effusion  Plan: Good progression. Per TCTS, pt will be discharged home today. He will be sent home on 40 mg PO Lasix BID. He will have an INR check early next week. Per TCTS PA, INR results will be faxed to [facility]. He will f/u with Dr. Donata Clay in 1 week. We will arrange   OP F/U at [facility]. Pt appears to be tolerating increase in BB well. HR is in the 80s. BP is stable. Will send home with 37.5 mg of Lopressor BID.     LOS: 5 days    Brittainy M. Delmer Islam 12/23/2012 10:32 AM  I have seen and evaluated the patient this AM along with Boyce Medici, PA. I agree with her findings, examination as well as impression recommendations.  Looks great today.  Up and about, no real CP, SOB or syncope/ near syncope.  BP & HR have improves to continues to diurese. Should be able to switch to PO lasix.  He is otherwise ready for d/c.    I am happy to see him in OP f/u.   Marykay Lex, M.D., M.S. [facility] [address] [place], [place]  [address]  [phone] Pager # [phone] 12/23/2012 11:10 AM

## 2012-12-23 NOTE — Progress Notes (Addendum)
                   [address]            [place] [address]          [phone]      4 Days Post-Op Procedure(s) (LRB): SUBXYPHOID PERICARDIAL WINDOW (N/A)  Subjective: Patient feels well and hopes to go home  Objective: Vital signs in last 24 hours: Temp:  [97.5 F (36.4 C)-97.8 F (36.6 C)] 97.5 F (36.4 C) (04/27 0457) Pulse Rate:  [78-90] 78 (04/27 0457) Cardiac Rhythm:  [-] Normal sinus rhythm (04/27 0819) Resp:  [16-18] 18 (04/27 0457) BP: (95-105)/(46-70) 105/62 mmHg (04/27 0457) SpO2:  [95 %-98 %] 98 % (04/27 0457)   Current Weight  12/20/12 102.468 kg (225 lb 14.4 oz)      Intake/Output from previous day: 04/26 0701 - 04/27 0700 In: 480 [P.O.:480] Out: 2100 [Urine:2100]   Physical Exam:  Cardiovascular: RRR Pulmonary: Slightly diminished at bases Abdomen: Soft, non tender, bowel sounds present. Extremities: Bilateral lower extremity edema. Wound: Clean and dry. No signs of infection. 2 eschars removed from previous chest tube sites. Slight bloody ooze afterward  Lab Results: CBC:  Recent Labs  12/21/12 0505 12/22/12 0504  WBC 6.9 5.2  HGB 7.8* 7.8*  HCT 24.3* 23.7*  PLT 173 185   BMET:   Recent Labs  12/21/12 0505 12/22/12 0504  NA 137 140  K 3.5 3.9  CL 101 103  CO2 31 31  GLUCOSE 83 114*  BUN 12 10  CREATININE 0.69 0.60  CALCIUM 8.7 8.7    PT/INR:  Lab Results  Component Value Date   INR 1.83* 12/23/2012   INR 1.73* 12/22/2012   INR 1.51* 12/21/2012   ABG:  INR: Will add last result for INR, ABG once components are confirmed Will add last 4 CBG results once components are confirmed  Assessment/Plan:  1. CV - SR. On Lopressor 37.5 bid, Digoxin 0.25 daily, low dose Coumadin. NO lovenox. INR slightly increased from 1.73 to 1.83. Will continue with 2 mg   2.  Pulmonary - Encourage incentive spirometer 3. Volume Overload - Continue Lasix 40 IV today as has had excellent diuresis 4.  Acute blood loss anemia  - H and H down to 7.8 and 23.7. Continue Ferrous. Monitor need for transfusion 5.DM-CBGS 117/133/87. Continue with Insulin 6.Discharge today  ZIMMERMAN,DONIELLE MPA-C 12/23/2012,8:42 AM     Agree with above.

## 2012-12-23 NOTE — Progress Notes (Signed)
   CARE MANAGEMENT NOTE 12/23/2012  Patient:  Dylan Church, Dylan Church   Account Number:  [id number]  Date Initiated:  12/18/2012  Documentation initiated by:  GRAVES-BIGELOW,BRENDA  Subjective/Objective Assessment:   Pt transferred from [facility] with syncope. Pt goes to the [employer] and CM called [place] to make them aware that pt is here. CM left vm on April Alexander's answering machine at ext [phone].     Action/Plan:   CM will continue to monitor for disposition needs. Pt was currently active with [facility] and will need resumption orders for services.   Anticipated DC Date:  12/19/2012   Anticipated DC Plan:  HOME W HOME HEALTH SERVICES      DC Planning Services  CM consult      [facility] Choice  HOME HEALTH  Resumption Of Svcs/PTA Provider   Choice offered to / List presented to:  C-1 Patient        HH arranged  HH-1 RN  HH-10 DISEASE MANAGEMENT  HH-2 PT      [facility] agency  [facility]   Status of service:  Completed, signed off Medicare Important Message given?   (If response is "NO", the following Medicare IM given date fields will be blank) Date Medicare IM given:   Date Additional Medicare IM given:    Discharge Disposition:  HOME W HOME HEALTH SERVICES  Per UR Regulation:  Reviewed for med. necessity/level of care/duration of stay  If discussed at Long Length of Stay Meetings, dates discussed:    Comments:  12/23/2012 1000 NCM spoke to [place] and they will follow up pt post discharge for [facility]. [facility] information added to dc instructions. Isidoro Donning RN CCM Case Mgmt phone [phone]

## 2012-12-24 LAB — CULTURE, BLOOD (ROUTINE X 2): Culture: NO GROWTH

## 2012-12-28 NOTE — Discharge Summary (Signed)
patient examined and medical record reviewed,agree with above note. VAN TRIGT III,Nanetta Wiegman 12/28/2012    

## 2012-12-29 HISTORY — PX: TRANSTHORACIC ECHOCARDIOGRAM: SHX275

## 2013-01-02 ENCOUNTER — Encounter: Payer: Self-pay | Admitting: Cardiothoracic Surgery

## 2013-01-02 ENCOUNTER — Ambulatory Visit (INDEPENDENT_AMBULATORY_CARE_PROVIDER_SITE_OTHER): Payer: Self-pay | Admitting: Cardiothoracic Surgery

## 2013-01-02 ENCOUNTER — Ambulatory Visit
Admission: RE | Admit: 2013-01-02 | Discharge: 2013-01-02 | Disposition: A | Discharge status: Home / Self Care | Payer: Non-veteran care | Source: Ambulatory Visit | Attending: Cardiothoracic Surgery | Admitting: Cardiothoracic Surgery

## 2013-01-02 VITALS — BP 98/66 | HR 20 | Resp 20 | Ht 70.0 in | Wt 212.0 lb

## 2013-01-02 DIAGNOSIS — I319 Disease of pericardium, unspecified: Secondary | ICD-10-CM

## 2013-01-02 DIAGNOSIS — L03119 Cellulitis of unspecified part of limb: Secondary | ICD-10-CM | POA: Insufficient documentation

## 2013-01-02 DIAGNOSIS — Z951 Presence of aortocoronary bypass graft: Secondary | ICD-10-CM

## 2013-01-02 DIAGNOSIS — I2510 Atherosclerotic heart disease of native coronary artery without angina pectoris: Secondary | ICD-10-CM

## 2013-01-02 DIAGNOSIS — I313 Pericardial effusion (noninflammatory): Secondary | ICD-10-CM

## 2013-01-02 DIAGNOSIS — L02419 Cutaneous abscess of limb, unspecified: Secondary | ICD-10-CM

## 2013-01-02 DIAGNOSIS — Z09 Encounter for follow-up examination after completed treatment for conditions other than malignant neoplasm: Secondary | ICD-10-CM

## 2013-01-02 NOTE — Progress Notes (Signed)
PCP is DOUGH,ROBERT, MD Referring Provider is Marykay Lex, MD  Chief Complaint  Patient presents with  . Routine Post Op    3 week f/u from surgery with CXR, S/P CABG x 3 on 12/03/12 and subxiphoid pericardial window on 12/19/12    HPI: 62 year old reformed smoker with severe diabetes Patient had CABG x3 April 7. He was started on Coumadin for postop atrial fib and developed a bloody pericardial effusion and he return to the hospital for subxiphoid pericardial window.  He is currently on  2 mg of Coumadin daily-followed by Dr. Landry Dyke office at [facility]. The patient reports no bleeding problems.  The patient has felt a saline is in his left leg the saphenous vein harvest site. The patient has developed mild to moderate bilateral pedal edema after stopping his Lasix following discharge as directed. The patient has developed a small left pleural effusion since discharge from the hospital.  The patient feels better, denies angina. He is walking daily. He has not yet started to drive.   Past Medical History  Diagnosis Date  . Hypertension   . Myocardial infarction   . Diabetes mellitus without complication   . Hyperlipidemia   . CAD (coronary artery disease), native coronary artery; h/o BMS with redo PTCA (in 2001) of prox RCA; and PTCA followed by DES (Taxus 3.0 x 16 mm) to Circumflex (2004) 11/28/2012  . Tobacco abuse 11/29/2012  . BACK PAIN, CHRONIC 02/08/2008    Qualifier: Diagnosis of  By: Koleen Distance CMA (AAMA), Hulan Saas    . Depression   . COPD (chronic obstructive pulmonary disease)   . Shortness of breath     Past Surgical History  Procedure Laterality Date  . Cardiac catheterization    . Coronary angioplasty  2001    BMS to prox RCC in 1990s - [place] Balloon PTCA in 2001 (2.75 mm)   . Cholecystectomy    . Percutaneous coronary stent intervention (pci-s)  2004    Taxus DES 3.0 mm and 16 mm.  . Coronary artery bypass graft N/A 12/03/2012   Procedure: CORONARY ARTERY BYPASS GRAFTING (CABG);  Surgeon: Kerin Perna, MD;  Location: [facility] OR;  Service: Open Heart Surgery;  Laterality: N/A;  . Intraoperative transesophageal echocardiogram N/A 12/03/2012    Procedure: INTRAOPERATIVE TRANSESOPHAGEAL ECHOCARDIOGRAM;  Surgeon: Kerin Perna, MD;  Location: [facility] OR;  Service: Open Heart Surgery;  Laterality: N/A;  . Knee surgery  1984  . Subxyphoid pericardial window N/A 12/19/2012    Procedure: SUBXYPHOID PERICARDIAL WINDOW;  Surgeon: Kerin Perna, MD;  Location[facility] OR;  Service: Thoracic;  Laterality: N/A;    Family History  Problem Relation Age of Onset  . CAD Sister     Social History History  Substance Use Topics  . Smoking status: Former Smoker -- 46 years    Types: Cigarettes    Quit date: 11/25/2012  . Smokeless tobacco: Never Used  . Alcohol Use: No    Current Outpatient Prescriptions  Medication Sig Dispense Refill  . aspirin EC 81 MG EC tablet Take 1 tablet (81 mg total) by mouth daily.  30 tablet  2  . atorvastatin (LIPITOR) 40 MG tablet Take 20 mg by mouth at bedtime.      . budesonide-formoterol (SYMBICORT) 160-4.5 MCG/ACT inhaler Inhale 2 puffs into the lungs 2 (two) times daily.  1 Inhaler  1  . cyclobenzaprine (FLEXERIL) 10 MG tablet Take 10 mg by mouth 3 (three) times daily as needed  for muscle spasms. For mucle spasms      . digoxin (LANOXIN) 0.25 MG tablet Take 1 tablet (0.25 mg total) by mouth daily.  30 tablet  1  . gabapentin (NEURONTIN) 800 MG tablet Take 800 mg by mouth 3 (three) times daily.      . insulin aspart (NOVOLOG) 100 UNIT/ML injection Inject 15 Units into the skin 3 (three) times daily before meals.      . insulin detemir (LEVEMIR) 100 UNIT/ML injection Inject 45-60 Units into the skin 2 (two) times daily. Takes 60 units in the am & 45 units in the pm      . levalbuterol (XOPENEX HFA) 45 MCG/ACT inhaler Inhale 2 puffs into the lungs every 6 (six) hours.  1 Inhaler  1  . metoprolol tartrate  (LOPRESSOR) 25 MG tablet Take 1.5 tablets (37.5 mg total) by mouth 2 (two) times daily.  90 tablet  1  . sertraline (ZOLOFT) 100 MG tablet Take 50 mg by mouth daily.      Marland Kitchen warfarin (COUMADIN) 2 MG tablet Take 1 tablet (2 mg total) by mouth daily. Or as directed by Dr. Landry Dyke office  30 tablet  1  . ferrous fumarate-b12-vitamic C-folic acid (TRINSICON / FOLTRIN) capsule Take 1 capsule by mouth 3 (three) times daily after meals.  90 capsule  1  . furosemide (LASIX) 40 MG tablet Take 1 tablet (40 mg total) by mouth 2 (two) times daily.  60 tablet  1  . potassium chloride 20 MEQ TBCR Take 20 mEq by mouth 2 (two) times daily.  60 tablet  1   No current facility-administered medications for this visit.    No Known Allergies  Review of Systems no fever, no drainage from incisions good appetite improving strength  BP 98/66  Pulse 20  Resp 20  Ht 5\' 10"  (1.778 m)  Wt 212 lb (96.163 kg)  BMI 30.42 kg/m2  SpO2 93% Physical Exam  alert and comfortable Lungs slightly diminished breath sounds left base Sternum well-healed without cellulitis Chest tube sites healing with granulation tissue Extremities left leg vein harvest site with cellulitis, bilateral pedal edema left> right  Diagnostic Tests: Chest x-ray with small left pleural effusion, blunting of costophrenic angle  Impression: Slow progression after CABG complicated by pericardial bloody effusion from Coumadin and supratherapeutic INR  Plan: Resume Lasix for lower Chairman edema and left pleural effusion Start Keflex 500 3 times a day for cellulitis of left leg Keep both legs elevated when sitting Resume driving but do not lift more than 10 pounds Return for reevaluation with chest x-ray in 2 weeks. Report bleeding problems directly to Dr. Landry Dyke office

## 2013-01-02 NOTE — Patient Instructions (Signed)
Keep feet elevated when sitting Take antibiotic tablet 3 times daily Resume Lasix 1 tablet every morning Take a 15-20 minute walk daily The patient may drive, do not lift more than 5 pounds at this point

## 2013-01-08 ENCOUNTER — Telehealth: Payer: Self-pay | Admitting: Cardiovascular Disease

## 2013-01-08 ENCOUNTER — Ambulatory Visit (HOSPITAL_COMMUNITY)
Admission: RE | Admit: 2013-01-08 | Discharge: 2013-01-08 | Disposition: A | Discharge status: Home / Self Care | Payer: Non-veteran care | Source: Ambulatory Visit | Attending: Cardiology | Admitting: Cardiology

## 2013-01-08 DIAGNOSIS — I319 Disease of pericardium, unspecified: Secondary | ICD-10-CM

## 2013-01-08 DIAGNOSIS — I313 Pericardial effusion (noninflammatory): Secondary | ICD-10-CM

## 2013-01-08 DIAGNOSIS — I2510 Atherosclerotic heart disease of native coronary artery without angina pectoris: Secondary | ICD-10-CM

## 2013-01-08 NOTE — Telephone Encounter (Signed)
Was not able to do Pt today or yesterday-pt wife is in the hospital-will try to get it tomorrow!

## 2013-01-08 NOTE — Progress Notes (Signed)
[facility]   2D echo completed 01/08/2013.   Cindy Jamisen Hawes, RDCS  

## 2013-01-08 NOTE — Telephone Encounter (Signed)
Patient message noted about PT. Will notify Kristen.

## 2013-01-09 ENCOUNTER — Ambulatory Visit (INDEPENDENT_AMBULATORY_CARE_PROVIDER_SITE_OTHER): Payer: Non-veteran care | Admitting: Pharmacist Clinician (PhC)/ Clinical Pharmacy Specialist

## 2013-01-09 ENCOUNTER — Encounter: Payer: Self-pay | Admitting: Pharmacist Clinician (PhC)/ Clinical Pharmacy Specialist

## 2013-01-09 ENCOUNTER — Ambulatory Visit: Payer: Non-veteran care | Admitting: Cardiothoracic Surgery

## 2013-01-09 DIAGNOSIS — Z7901 Long term (current) use of anticoagulants: Secondary | ICD-10-CM | POA: Insufficient documentation

## 2013-01-09 DIAGNOSIS — I2699 Other pulmonary embolism without acute cor pulmonale: Secondary | ICD-10-CM

## 2013-01-09 LAB — POCT INR: INR: 3.3

## 2013-01-10 ENCOUNTER — Encounter: Payer: Self-pay | Admitting: Cardiovascular Disease

## 2013-01-10 ENCOUNTER — Ambulatory Visit (INDEPENDENT_AMBULATORY_CARE_PROVIDER_SITE_OTHER): Payer: Non-veteran care | Admitting: Cardiology

## 2013-01-10 ENCOUNTER — Encounter: Payer: Self-pay | Admitting: Cardiology

## 2013-01-10 VITALS — BP 106/66 | HR 90 | Ht 70.0 in | Wt 204.0 lb

## 2013-01-10 DIAGNOSIS — E785 Hyperlipidemia, unspecified: Secondary | ICD-10-CM

## 2013-01-10 DIAGNOSIS — I2510 Atherosclerotic heart disease of native coronary artery without angina pectoris: Secondary | ICD-10-CM

## 2013-01-10 DIAGNOSIS — I313 Pericardial effusion (noninflammatory): Secondary | ICD-10-CM | POA: Insufficient documentation

## 2013-01-10 DIAGNOSIS — I10 Essential (primary) hypertension: Secondary | ICD-10-CM

## 2013-01-10 DIAGNOSIS — I319 Disease of pericardium, unspecified: Secondary | ICD-10-CM

## 2013-01-10 DIAGNOSIS — F17200 Nicotine dependence, unspecified, uncomplicated: Secondary | ICD-10-CM

## 2013-01-10 DIAGNOSIS — Z720 Tobacco use: Secondary | ICD-10-CM

## 2013-01-10 DIAGNOSIS — I3139 Other pericardial effusion (noninflammatory): Secondary | ICD-10-CM | POA: Insufficient documentation

## 2013-01-10 MED ORDER — METOPROLOL TARTRATE 50 MG PO TABS: 50.0000 mg | ORAL_TABLET | Freq: Two times a day (BID) | ORAL | Status: AC

## 2013-01-10 NOTE — Assessment & Plan Note (Addendum)
Stable, but able to increase BB dose.

## 2013-01-10 NOTE — Assessment & Plan Note (Addendum)
On statin - recheck labs in 3 months return visit.

## 2013-01-10 NOTE — Assessment & Plan Note (Signed)
Stable.  No active issues.

## 2013-01-10 NOTE — Patient Instructions (Addendum)
Continue to exercise. Your Echocardiogram looked great!!  I am very pleased with your progression. Please ask Dr. Donata Clay about the Leg & chest wound stitches. ~3 months total of Coumadin is what we are looking at.  We will see you back in ~3 months.  We will recheck you cholesterol level after that. I am adding a low dose of Blood Pressure medicine to protect your heart.  Marykay Lex, MD

## 2013-01-10 NOTE — Progress Notes (Signed)
[facility]  Patient ID: Dylan Church, male   DOB: 17-Feb-1951, 62 y.o.   MRN: [id number]  Clinic Note: HPI: Dylan Church is a 62 y.o. male with a PMH below who presents today for CABG done for Unstable Angina 10/2012-11/2012.  He initially presented in the end of March with crescendo unstable angina requiring multiple nitroglycerin tablets to the course of several days and then had a resting episode Department of emergent. Mild troponin elevations. He was taken urgently to the cardiac catheterization lab that day and was found to have significant multivessel disease. Coronary artery bypass grafting was recommended.   Complicated post-op course: When he first went home he was CABG, he was doing well but somehow had an unintentional overdose of Flexeril and was found down the floor by his family members. He was brought in her son have a DVT and PE. He subsequent was started on warfarin was discharged on warfarin plus Lovenox. He then came in with a hemorrhagic pericardial effusion requiring pericardial window.    Follow-up Echo 01/08/13:  Normal LV size, moderate concentric LVH with grade 1 diastolic dysfunction. Normal wall motion. EF  70-75%. Aortic sclerosis no stenosis. Trivial posterior pericolic effusion.   Finally after that is complete he felt much better and he since his discharge from that final procedure he's been doing extremely well. He saw Dr. Donata Clay who has approved him returning to driving. He is exercising 20 at a time at least 3 times a day. He adamantly denies any further episodes of angina or shortness of breath with rest or exertion. He quit smoking at time of his first hospitalization, and is no longer using the patches. His son eventually helped up by cleaning out the house totally cleaning all the walls removing all the smoking paraphernalia. His biggest impediment is his granddaughter smokes instructions bothers him. He's been aggressively watching  his diet and has been trying to lose weight..  From an overall cardiac standpoint, feels better than he has felt for last 3-4 years. He continues to have a little swelling in both legs, left greater than right with venostasis changes. But no PND or orthopnea no shortness breath with exertion. No chest pain with rest or exertion.  He does have one area of the small wound on his left leg and one on the sternum that has an apparent stitch still in place. I've asked him to ask Dr. Donata Clay about these  Cardiovascular ROS: no chest pain or dyspnea on exertion positive for - edema and with venous stasis changes negative for - irregular heartbeat, loss of consciousness, orthopnea, palpitations, paroxysmal nocturnal dyspnea, rapid heart rate or shortness of breath.  No TIA or amaurosis fugax symptoms. No lightheadedness, weakness, syncope/near syncope. No claudication. No melena, hematochezia, hematuria.   No Known Allergies  Current Outpatient Prescriptions  Medication Sig Dispense Refill  . aspirin EC 81 MG EC tablet Take 1 tablet (81 mg total) by mouth daily.  30 tablet  2  . atorvastatin (LIPITOR) 40 MG tablet Take 20 mg by mouth at bedtime.      . budesonide-formoterol (SYMBICORT) 160-4.5 MCG/ACT inhaler Inhale 2 puffs into the lungs 2 (two) times daily.  1 Inhaler  1  . cyclobenzaprine (FLEXERIL) 10 MG tablet Take 10 mg by mouth 3 (three) times daily as needed for muscle spasms. For mucle spasms      . digoxin (LANOXIN) 0.25 MG tablet Take 1 tablet (0.25 mg total) by mouth  daily.  30 tablet  1  . ferrous fumarate-b12-vitamic C-folic acid (TRINSICON / FOLTRIN) capsule Take 1 capsule by mouth 3 (three) times daily after meals.  90 capsule  1  . furosemide (LASIX) 40 MG tablet Take 1 tablet (40 mg total) by mouth 2 (two) times daily.  60 tablet  1  . gabapentin (NEURONTIN) 800 MG tablet Take 800 mg by mouth 3 (three) times daily.      . insulin aspart (NOVOLOG) 100 UNIT/ML injection Inject 15 Units  into the skin 3 (three) times daily before meals.      . insulin detemir (LEVEMIR) 100 UNIT/ML injection Inject 45-60 Units into the skin 2 (two) times daily. Takes 60 units in the am & 45 units in the pm      . levalbuterol (XOPENEX HFA) 45 MCG/ACT inhaler Inhale 2 puffs into the lungs every 6 (six) hours.  1 Inhaler  1  . metoprolol tartrate (LOPRESSOR) 25 MG tablet Take 1.5 tablets (37.5 mg total) by mouth 2 (two) times daily.  90 tablet  1  . potassium chloride 20 MEQ TBCR Take 20 mEq by mouth 2 (two) times daily.  60 tablet  1  . sertraline (ZOLOFT) 100 MG tablet Take 50 mg by mouth daily.      Marland Kitchen warfarin (COUMADIN) 2 MG tablet Take 1 tablet (2 mg total) by mouth daily. Or as directed by Dr. Landry Dyke office  30 tablet  1   No current facility-administered medications for this visit.    Past Medical History  Diagnosis Date  . Hypertension   . Myocardial infarction 12/11/2012    Echo done, see procedures  . Diabetes mellitus without complication   . Hyperlipidemia   . CAD (coronary artery disease), native coronary artery; h/o BMS with redo PTCA (in 2001) of prox RCA; and PTCA followed by DES (Taxus 3.0 x 16 mm) to Circumflex (2004) 11/28/2012    2004 Taxus stent to circumflex coronary artery  . Tobacco abuse 11/29/2012    Quit 11/2012  . BACK PAIN, CHRONIC 02/08/2008    Qualifier: Diagnosis of  By: Koleen Distance CMA (AAMA), Hulan Saas    . Depression   . COPD (chronic obstructive pulmonary disease)   . Shortness of breath   . Pulmonary embolism, bilateral     after being found down -- ? OD on flexeril; on coumadin  . Unstable angina 11/2012    - Multivessel CAD --> CABG  . Pericardial effusion, acute 11/2012    Supratherapeutic INR shortly post-op CABG; s/p Peicardial window.   *status post pericardial window, resolved on recent echo 12/1022   Chronic low back pain on a chronic pain pump.   Past Surgical History  Procedure Laterality Date  . Cardiac catheterization    . Coronary angioplasty   2001    BMS to prox RCC in 1990s - Cutting Balloon PTCA in 2001 (2.75 mm)   . Cholecystectomy    . Percutaneous coronary stent intervention (pci-s)  2004    Taxus DES 3.0 mm and 16 mm.  . Coronary artery bypass graft N/A 12/03/2012    Procedure: CORONARY ARTERY BYPASS GRAFTING (CABG);  Surgeon: Kerin Perna, MD;  Location: [facility] OR;  Service: Open Heart Surgery;  Laterality: N/A;  . Intraoperative transesophageal echocardiogram N/A 12/03/2012    Procedure: INTRAOPERATIVE TRANSESOPHAGEAL ECHOCARDIOGRAM;  Surgeon: Kerin Perna, MD;  Location: [facility] OR;  Service: Open Heart Surgery;  Laterality: N/A;  . Knee surgery  1984  . Subxyphoid pericardial window N/A  12/19/2012    Procedure: SUBXYPHOID PERICARDIAL WINDOW;  Surgeon: Kerin Perna, MD;  Location: [facility] OR;  Service: Thoracic;  Laterality: N/A;    History   Social History  . Marital Status: Married    Spouse Name: N/A    Number of Children: N/A  . Years of Education: N/A   Occupational History  . Not on file.   Social History Main Topics  . Smoking status: Former Smoker -- 46 years    Types: Cigarettes    Quit date: 11/25/2012  . Smokeless tobacco: Never Used  . Alcohol Use: No  . Drug Use: No  . Sexually Active: Not on file   Other Topics Concern  . Not on file   Social History Narrative  . No narrative on file   He is extremely active exercising 20 minutes at least 3 times a day. He prefers to do his own rehabilitation at home based on the inpatient rehabilitation recommendations. He's not being on group settings.  Working on dietary modifications.   ROS: Review of Systems - Negative except Pertinent positives in history of present illness and noted below. Musculoskeletal ROS: positive for - gait disturbance, joint pain, muscle pain and No significant change from his baseline Dermatological ROS: positive for Venous stasis findings bilateral lower shortness PHYSICAL EXAM BP 106/66  Ht 5\' 10"  (1.778 m)  Wt 204 lb (92.534  kg)  BMI 29.27 kg/m2 General appearance: alert, cooperative, appears stated age, no distress, mildly obese and Pleasant mood and affect. Neck: no adenopathy, no carotid bruit, no JVD, supple, symmetrical, trachea midline, thyroid not enlarged, symmetric, no tenderness/mass/nodules and Head examination shows no wound/surgical scar on the top of the scalp. Lungs: rhonchi bilaterally and Almost probable tibial sounding in nature. No real wheezing this is all respiratory and Nonlabored, clear to percussion; prolonged expiration phase. Heart: regular rate and rhythm, S1, S2 normal, no S3 or S4, systolic murmur: systolic ejection 1/6, crescendo and decrescendo at 2nd right intercostal space, no rub and Nondisplaced PMI. No heaves or thrills. Abdomen: soft, non-tender; bowel sounds normal; no masses,  no organomegaly and Mild truncal obesity Extremities: edema Trace to 1+ on the right, 2+ on left there is also more erythematous. This is the leg where the vein was removed and venous stasis dermatitis noted Pulses: 2+ and symmetric Skin: Venous stasis dermatitis as noted above Neurologic: Grossly normal  GEX:[id number] today: Yes Rate: 90 , Rhythm:  normal sinus;  Conduction Delay  none, normal axis; AV Block:  none; ST-T abnormalities:  nonspecific ST-T changes in the inferior and lateral leads. 50 mm in the inferior leads are probably the following changes from post CABG.   ASSESSMENT: Doing very well at her, get course post CABG. He is now to be establishing his cardiology care with me as opposed to the to the [place], simply since we have all his records.  PLAN: per problem list Orders Placed This Encounter  Procedures  . EKG 12-Lead    Marykay Lex, M.D., M.S. [facility] [address] [place], [place]  [address]  [phone] Pager # [phone] 01/10/2013 9:49 AM

## 2013-01-10 NOTE — Progress Notes (Signed)
We discussed the results of this study during his visit today.  Marykay Lex, MD

## 2013-01-10 NOTE — Assessment & Plan Note (Signed)
Doing remarkably well.  Progressing at a good rate.  No active problems.  Talk to Dr. Donata Clay about the stitches. Will increase BB dose to 50 mg bid

## 2013-01-10 NOTE — Assessment & Plan Note (Signed)
Quit at time of initial hospitalization. Now off patches -- congratulated.  No plans to restart.

## 2013-01-11 ENCOUNTER — Telehealth: Payer: Self-pay | Admitting: Cardiovascular Disease

## 2013-01-11 NOTE — Telephone Encounter (Signed)
Trying to follow up on Pt that was called in on 5-14!

## 2013-01-11 NOTE — Telephone Encounter (Signed)
Robb Matar, asked for INR repeat next week Wed May 21

## 2013-01-14 ENCOUNTER — Other Ambulatory Visit: Payer: Self-pay | Admitting: *Deleted

## 2013-01-14 DIAGNOSIS — Z951 Presence of aortocoronary bypass graft: Secondary | ICD-10-CM

## 2013-01-14 DIAGNOSIS — I2510 Atherosclerotic heart disease of native coronary artery without angina pectoris: Secondary | ICD-10-CM

## 2013-01-16 ENCOUNTER — Ambulatory Visit (INDEPENDENT_AMBULATORY_CARE_PROVIDER_SITE_OTHER): Payer: Self-pay | Admitting: Cardiothoracic Surgery

## 2013-01-16 ENCOUNTER — Other Ambulatory Visit: Payer: Self-pay | Admitting: *Deleted

## 2013-01-16 ENCOUNTER — Encounter: Payer: Self-pay | Admitting: Cardiothoracic Surgery

## 2013-01-16 ENCOUNTER — Ambulatory Visit
Admission: RE | Admit: 2013-01-16 | Discharge: 2013-01-16 | Disposition: A | Discharge status: Home / Self Care | Payer: Non-veteran care | Source: Ambulatory Visit | Attending: Cardiothoracic Surgery | Admitting: Cardiothoracic Surgery

## 2013-01-16 VITALS — BP 140/91 | HR 103 | Resp 16 | Ht 70.0 in | Wt 203.0 lb

## 2013-01-16 DIAGNOSIS — I2510 Atherosclerotic heart disease of native coronary artery without angina pectoris: Secondary | ICD-10-CM

## 2013-01-16 DIAGNOSIS — I313 Pericardial effusion (noninflammatory): Secondary | ICD-10-CM

## 2013-01-16 DIAGNOSIS — Z951 Presence of aortocoronary bypass graft: Secondary | ICD-10-CM

## 2013-01-16 DIAGNOSIS — I319 Disease of pericardium, unspecified: Secondary | ICD-10-CM

## 2013-01-16 DIAGNOSIS — I3139 Other pericardial effusion (noninflammatory): Secondary | ICD-10-CM

## 2013-01-16 DIAGNOSIS — Z09 Encounter for follow-up examination after completed treatment for conditions other than malignant neoplasm: Secondary | ICD-10-CM

## 2013-01-16 NOTE — Progress Notes (Signed)
PCP is No primary provider on file. Referring Provider is Lennette Bihari, MD  Chief Complaint  Patient presents with  . Routine Post Op    2 wk f/u with cxr    HPI: 62 year old diabetic reformed smoker had multivessel CABG for unstable angina. He return 2 weeks ago with fluid overload and a left pleural effusion. Lasix was restarted and he was given some Keflex for cellulitis around his leg incision. He now reports for reexam.  His swelling and shortness of breath improved. Redness and tenderness around the right leg incision have resolved. He feels the best he is felt in several months. His blood sugars are well-controlled His cardiac medications were recently adjusted by Dr. Nicholaus Bloom  Past Medical History  Diagnosis Date  . Hypertension   . Myocardial infarction 12/11/2012    Echo done, see procedures  . Diabetes mellitus without complication   . Hyperlipidemia   . CAD (coronary artery disease), native coronary artery; h/o BMS with redo PTCA (in 2001) of prox RCA; and PTCA followed by DES (Taxus 3.0 x 16 mm) to Circumflex (2004) 11/28/2012    2004 Taxus stent to circumflex coronary artery  . Tobacco abuse 11/29/2012    Quit 11/2012  . BACK PAIN, CHRONIC 02/08/2008    Qualifier: Diagnosis of  By: Koleen Distance CMA (AAMA), Hulan Saas    . Depression   . COPD (chronic obstructive pulmonary disease)   . Shortness of breath   . Pulmonary embolism, bilateral     after being found down -- ? OD on flexeril; on coumadin  . Unstable angina 11/2012    - Multivessel CAD --> CABG  . Pericardial effusion, acute 11/2012    Supratherapeutic INR shortly post-op CABG; s/p Peicardial window.    Past Surgical History  Procedure Laterality Date  . Cardiac catheterization    . Coronary angioplasty  2001    BMS to prox RCC in 1990s - Cutting Balloon PTCA in 2001 (2.75 mm)   . Cholecystectomy    . Percutaneous coronary stent intervention (pci-s)  2004    Taxus DES 3.0 mm and 16 mm.  . Coronary artery bypass  graft N/A 12/03/2012    Procedure: CORONARY ARTERY BYPASS GRAFTING (CABG);  Surgeon: Kerin Perna, MD;  Location: [facility] OR;  Service: Open Heart Surgery;  Laterality: N/A;  . Intraoperative transesophageal echocardiogram N/A 12/03/2012    Procedure: INTRAOPERATIVE TRANSESOPHAGEAL ECHOCARDIOGRAM;  Surgeon: Kerin Perna, MD;  Location: [facility] OR;  Service: Open Heart Surgery;  Laterality: N/A;  . Knee surgery  1984  . Subxyphoid pericardial window N/A 12/19/2012    Procedure: SUBXYPHOID PERICARDIAL WINDOW;  Surgeon: Kerin Perna, MD;  Location: [facility] OR;  Service: Thoracic;  Laterality: N/A;    Family History  Problem Relation Age of Onset  . CAD Sister     Social History History  Substance Use Topics  . Smoking status: Former Smoker -- 46 years    Types: Cigarettes    Quit date: 11/25/2012  . Smokeless tobacco: Never Used  . Alcohol Use: No    Current Outpatient Prescriptions  Medication Sig Dispense Refill  . aspirin EC 81 MG EC tablet Take 1 tablet (81 mg total) by mouth daily.  30 tablet  2  . atorvastatin (LIPITOR) 40 MG tablet Take 20 mg by mouth at bedtime.      . budesonide-formoterol (SYMBICORT) 160-4.5 MCG/ACT inhaler Inhale 2 puffs into the lungs 2 (two) times daily.  1 Inhaler  1  . cyclobenzaprine (FLEXERIL)  10 MG tablet Take 10 mg by mouth 3 (three) times daily as needed for muscle spasms. For mucle spasms      . digoxin (LANOXIN) 0.25 MG tablet Take 1 tablet (0.25 mg total) by mouth daily.  30 tablet  1  . ferrous fumarate-b12-vitamic C-folic acid (TRINSICON / FOLTRIN) capsule Take 1 capsule by mouth 3 (three) times daily after meals.  90 capsule  1  . furosemide (LASIX) 40 MG tablet Take 1 tablet (40 mg total) by mouth 2 (two) times daily.  60 tablet  1  . gabapentin (NEURONTIN) 800 MG tablet Take 800 mg by mouth 3 (three) times daily.      . insulin aspart (NOVOLOG) 100 UNIT/ML injection Inject 15 Units into the skin 3 (three) times daily before meals.      . insulin  detemir (LEVEMIR) 100 UNIT/ML injection Inject 45-60 Units into the skin 2 (two) times daily. Takes 60 units in the am & 45 units in the pm      . levalbuterol (XOPENEX HFA) 45 MCG/ACT inhaler Inhale 2 puffs into the lungs every 6 (six) hours.  1 Inhaler  1  . metoprolol tartrate (LOPRESSOR) 50 MG tablet Take 1 tablet (50 mg total) by mouth 2 (two) times daily.  90 tablet  1  . potassium chloride 20 MEQ TBCR Take 20 mEq by mouth 2 (two) times daily.  60 tablet  1  . sertraline (ZOLOFT) 100 MG tablet Take 50 mg by mouth daily.      Marland Kitchen warfarin (COUMADIN) 2 MG tablet Take 1 tablet (2 mg total) by mouth daily. Or as directed by Dr. Landry Dyke office  30 tablet  1   No current facility-administered medications for this visit.    No Known Allergies  Review of Systems no fever improved edema improved strength        he is not smoking  BP 140/91  Pulse 103  Resp 16  Ht 5\' 10"  (1.778 m)  Wt 203 lb (92.08 kg)  BMI 29.13 kg/m2  SpO2 95% Physical Exam Alert and comfortable Lungs clear Heart rhythm regular Surgical incision is healed Pedal edema minimal  Diagnostic Tests: Chest x-ray with improved aeration, resolution of left pleural effusion  Impression: Patient doing much better now on Lasix. He'll continue daily Lasix until he returns for followup chest x-ray in 4 weeks  Plan: Return with chest x-ray in 4 weeks No lifting more than 20 pounds until next exam

## 2013-01-17 ENCOUNTER — Telehealth: Payer: Self-pay | Admitting: Cardiovascular Disease

## 2013-01-17 NOTE — Telephone Encounter (Signed)
Becky from [facility] is calling because Dylan Church had an order for an PT/INR on 01/16/2013 , but he was not at home all day ,she went by three times. Want to know when do we want them to draw the pt/inr .Marland KitchenPlease call at [phone]. Can also call Dorene Grebe with the order

## 2013-01-18 NOTE — Telephone Encounter (Signed)
Message forwarded to K. Alvstad, PharmD for further instructions.

## 2013-01-18 NOTE — Telephone Encounter (Signed)
Dylan Church will try to draw INR Tuesday May 27.

## 2013-01-22 ENCOUNTER — Ambulatory Visit (INDEPENDENT_AMBULATORY_CARE_PROVIDER_SITE_OTHER): Payer: Self-pay | Admitting: Pharmacist Clinician (PhC)/ Clinical Pharmacy Specialist

## 2013-01-22 ENCOUNTER — Telehealth: Payer: Self-pay | Admitting: *Deleted

## 2013-01-22 ENCOUNTER — Ambulatory Visit: Payer: Self-pay | Admitting: Pharmacist Clinician (PhC)/ Clinical Pharmacy Specialist

## 2013-01-22 DIAGNOSIS — I2699 Other pulmonary embolism without acute cor pulmonale: Secondary | ICD-10-CM

## 2013-01-22 DIAGNOSIS — Z7901 Long term (current) use of anticoagulants: Secondary | ICD-10-CM

## 2013-01-22 LAB — PROTIME-INR: INR: 2.2 — AB (ref <=1.1)

## 2013-01-22 NOTE — Telephone Encounter (Signed)
Therapeutic INR on Coumadin.  Dylan Lex, MD

## 2013-01-22 NOTE — Progress Notes (Signed)
This encounter does not have an INR. It will be closed until an INR is listed.

## 2013-01-22 NOTE — Telephone Encounter (Signed)
Dr. Herbie Baltimore notified and stated values are normal.  Advised forward to K. Alvstad, PharmD for f/u.    Message forwarded to K. Alvstad, PharmD.

## 2013-01-22 NOTE — Telephone Encounter (Signed)
Georgetta Haber called in critical labs: PT 22.8, INR 2.2.  Paper chart requested STAT.

## 2013-01-29 ENCOUNTER — Ambulatory Visit (INDEPENDENT_AMBULATORY_CARE_PROVIDER_SITE_OTHER): Payer: Self-pay | Admitting: Pharmacist Clinician (PhC)/ Clinical Pharmacy Specialist

## 2013-01-29 DIAGNOSIS — Z7901 Long term (current) use of anticoagulants: Secondary | ICD-10-CM

## 2013-01-29 DIAGNOSIS — I2699 Other pulmonary embolism without acute cor pulmonale: Secondary | ICD-10-CM

## 2013-01-29 LAB — POCT INR: INR: 1.9

## 2013-01-29 NOTE — Progress Notes (Signed)
I cannot close this encounter b/c no INR is listed.

## 2013-02-05 ENCOUNTER — Telehealth: Payer: Self-pay | Admitting: Pharmacist Clinician (PhC)/ Clinical Pharmacy Specialist

## 2013-02-05 ENCOUNTER — Ambulatory Visit (INDEPENDENT_AMBULATORY_CARE_PROVIDER_SITE_OTHER): Payer: Medicare Other | Admitting: Pharmacist Clinician (PhC)/ Clinical Pharmacy Specialist

## 2013-02-05 DIAGNOSIS — I2699 Other pulmonary embolism without acute cor pulmonale: Secondary | ICD-10-CM

## 2013-02-05 DIAGNOSIS — Z7901 Long term (current) use of anticoagulants: Secondary | ICD-10-CM

## 2013-02-05 NOTE — Telephone Encounter (Signed)
PT  27.2  INR 2.7  2.5 coumadin daily---none on Wednesday or Saturday      Call [phone]

## 2013-02-06 ENCOUNTER — Encounter: Payer: Self-pay | Admitting: Cardiovascular Disease

## 2013-02-07 ENCOUNTER — Encounter: Payer: Self-pay | Admitting: Cardiovascular Disease

## 2013-02-13 ENCOUNTER — Telehealth: Payer: Self-pay | Admitting: Pharmacist Clinician (PhC)/ Clinical Pharmacy Specialist

## 2013-02-13 ENCOUNTER — Ambulatory Visit (INDEPENDENT_AMBULATORY_CARE_PROVIDER_SITE_OTHER): Payer: Self-pay | Admitting: Pharmacist Clinician (PhC)/ Clinical Pharmacy Specialist

## 2013-02-13 DIAGNOSIS — I2699 Other pulmonary embolism without acute cor pulmonale: Secondary | ICD-10-CM

## 2013-02-13 DIAGNOSIS — Z7901 Long term (current) use of anticoagulants: Secondary | ICD-10-CM

## 2013-02-13 LAB — POCT INR: INR: 3.1

## 2013-02-13 NOTE — Telephone Encounter (Signed)
Pt notified of results, has appt set for July 3 8:10am

## 2013-02-13 NOTE — Telephone Encounter (Signed)
Kara with [facility] calling PT INR    PT 30.7  INR 3.1.  Pt was d/c from their care today so he will have to follow up here.  Call the patient's cell phone [phone].

## 2013-02-14 ENCOUNTER — Other Ambulatory Visit: Payer: Self-pay | Admitting: *Deleted

## 2013-02-14 DIAGNOSIS — I314 Cardiac tamponade: Secondary | ICD-10-CM

## 2013-02-20 ENCOUNTER — Ambulatory Visit: Payer: Self-pay | Admitting: Cardiothoracic Surgery

## 2013-02-25 ENCOUNTER — Other Ambulatory Visit: Payer: Self-pay | Admitting: *Deleted

## 2013-02-27 ENCOUNTER — Ambulatory Visit (INDEPENDENT_AMBULATORY_CARE_PROVIDER_SITE_OTHER): Payer: Self-pay | Admitting: Cardiothoracic Surgery

## 2013-02-27 ENCOUNTER — Other Ambulatory Visit: Payer: Self-pay | Admitting: Cardiothoracic Surgery

## 2013-02-27 ENCOUNTER — Ambulatory Visit
Admission: RE | Admit: 2013-02-27 | Discharge: 2013-02-27 | Disposition: A | Discharge status: Home / Self Care | Payer: Medicare Other | Source: Ambulatory Visit | Attending: Surgery | Admitting: Surgery

## 2013-02-27 VITALS — BP 138/87 | HR 93 | Resp 16 | Ht 70.0 in | Wt 196.0 lb

## 2013-02-27 DIAGNOSIS — I314 Cardiac tamponade: Secondary | ICD-10-CM

## 2013-02-27 DIAGNOSIS — Z951 Presence of aortocoronary bypass graft: Secondary | ICD-10-CM

## 2013-02-27 DIAGNOSIS — I2510 Atherosclerotic heart disease of native coronary artery without angina pectoris: Secondary | ICD-10-CM

## 2013-02-27 NOTE — Progress Notes (Signed)
PCP is No primary provider on file. Referring Provider is Marykay Lex, MD  Chief Complaint  Patient presents with  . Routine Post Op    4 wk with cxr s/p CABG X 3 12/19/12    HPI: Final postop visit after urgent CABG x3 when he presented with unstable angina. On doing well walking 2 miles daily without angina or symptoms of CHF. Surgical incision is well-healed. Followup chest x-ray today shows complete resolution of left pleural effusion with clear lung fields. Patient following his diet, and not smoking, and controlling blood sugars.   Past Medical History  Diagnosis Date  . Hypertension   . Myocardial infarction 12/11/2012    Echo done, see procedures  . Diabetes mellitus without complication   . Hyperlipidemia   . CAD (coronary artery disease), native coronary artery; h/o BMS with redo PTCA (in 2001) of prox RCA; and PTCA followed by DES (Taxus 3.0 x 16 mm) to Circumflex (2004) 11/28/2012    2004 Taxus stent to circumflex coronary artery  . Tobacco abuse 11/29/2012    Quit 11/2012  . BACK PAIN, CHRONIC 02/08/2008    Qualifier: Diagnosis of  By: Koleen Distance CMA (AAMA), Hulan Saas    . Depression   . COPD (chronic obstructive pulmonary disease)   . Shortness of breath   . Pulmonary embolism, bilateral     after being found down -- ? OD on flexeril; on coumadin  . Unstable angina 11/2012    - Multivessel CAD --> CABG  . Pericardial effusion, acute 11/2012    Supratherapeutic INR shortly post-op CABG; s/p Peicardial window.    Past Surgical History  Procedure Laterality Date  . Cardiac catheterization    . Coronary angioplasty  2001    BMS to prox RCC in 1990s - Cutting Balloon PTCA in 2001 (2.75 mm)   . Cholecystectomy    . Percutaneous coronary stent intervention (pci-s)  2004    Taxus DES 3.0 mm and 16 mm.  . Coronary artery bypass graft N/A 12/03/2012    Procedure: CORONARY ARTERY BYPASS GRAFTING (CABG);  Surgeon: Kerin Perna, MD;  Location: [facility] OR;  Service: Open Heart Surgery;   Laterality: N/A;  . Intraoperative transesophageal echocardiogram N/A 12/03/2012    Procedure: INTRAOPERATIVE TRANSESOPHAGEAL ECHOCARDIOGRAM;  Surgeon: Kerin Perna, MD;  Location: [facility] OR;  Service: Open Heart Surgery;  Laterality: N/A;  . Knee surgery  1984  . Subxyphoid pericardial window N/A 12/19/2012    Procedure: SUBXYPHOID PERICARDIAL WINDOW;  Surgeon: Kerin Perna, MD;  Location: [facility] OR;  Service: Thoracic;  Laterality: N/A;    Family History  Problem Relation Age of Onset  . CAD Sister     Social History History  Substance Use Topics  . Smoking status: Former Smoker -- 46 years    Types: Cigarettes    Quit date: 11/25/2012  . Smokeless tobacco: Never Used  . Alcohol Use: No    Current Outpatient Prescriptions  Medication Sig Dispense Refill  . aspirin EC 81 MG EC tablet Take 1 tablet (81 mg total) by mouth daily.  30 tablet  2  . atorvastatin (LIPITOR) 40 MG tablet Take 20 mg by mouth at bedtime.      . budesonide-formoterol (SYMBICORT) 160-4.5 MCG/ACT inhaler Inhale 2 puffs into the lungs 2 (two) times daily.  1 Inhaler  1  . cyclobenzaprine (FLEXERIL) 10 MG tablet Take 10 mg by mouth 3 (three) times daily as needed for muscle spasms. For mucle spasms      .  digoxin (LANOXIN) 0.25 MG tablet Take 1 tablet (0.25 mg total) by mouth daily.  30 tablet  1  . ferrous fumarate-b12-vitamic C-folic acid (TRINSICON / FOLTRIN) capsule Take 1 capsule by mouth 3 (three) times daily after meals.  90 capsule  1  . furosemide (LASIX) 40 MG tablet Take 1 tablet (40 mg total) by mouth 2 (two) times daily.  60 tablet  1  . gabapentin (NEURONTIN) 800 MG tablet Take 800 mg by mouth 3 (three) times daily.      . insulin aspart (NOVOLOG) 100 UNIT/ML injection Inject 15 Units into the skin 3 (three) times daily before meals.      . insulin detemir (LEVEMIR) 100 UNIT/ML injection Inject 45-60 Units into the skin 2 (two) times daily. Takes 60 units in the am & 45 units in the pm      .  levalbuterol (XOPENEX HFA) 45 MCG/ACT inhaler Inhale 2 puffs into the lungs every 6 (six) hours.  1 Inhaler  1  . metoprolol tartrate (LOPRESSOR) 50 MG tablet Take 1 tablet (50 mg total) by mouth 2 (two) times daily.  90 tablet  1  . potassium chloride 20 MEQ TBCR Take 20 mEq by mouth 2 (two) times daily.  60 tablet  1  . sertraline (ZOLOFT) 100 MG tablet Take 50 mg by mouth daily.      Marland Kitchen warfarin (COUMADIN) 2 MG tablet Take 1 tablet (2 mg total) by mouth daily. Or as directed by Dr. Landry Dyke office  30 tablet  1   No current facility-administered medications for this visit.    No Known Allergies  Review of Systems no shortness of breath chest pain or edema  BP 138/87  Pulse 93  Resp 16  Ht 5\' 10"  (1.778 m)  Wt 196 lb (88.905 kg)  BMI 28.12 kg/m2  SpO2 96% Physical Exam Alert and comfortable Lungs clear Sternum well-healed Heart rate regular without murmur gallop  Diagnostic Tests: Chest x-ray clear-complete resolution of left pleural effusion  Impression: Doing well now of 3 months after CABG x3. Continue current medications, exercise program, and smoking cessation Plan: Return when necessary

## 2013-02-28 ENCOUNTER — Ambulatory Visit (INDEPENDENT_AMBULATORY_CARE_PROVIDER_SITE_OTHER): Payer: Medicare Other | Admitting: Pharmacist Clinician (PhC)/ Clinical Pharmacy Specialist

## 2013-02-28 VITALS — BP 122/62 | HR 76

## 2013-02-28 DIAGNOSIS — I2699 Other pulmonary embolism without acute cor pulmonale: Secondary | ICD-10-CM

## 2013-02-28 DIAGNOSIS — Z7901 Long term (current) use of anticoagulants: Secondary | ICD-10-CM

## 2013-03-04 ENCOUNTER — Telehealth: Payer: Self-pay | Admitting: Cardiovascular Disease

## 2013-03-04 ENCOUNTER — Other Ambulatory Visit: Payer: Self-pay | Admitting: Pharmacist Clinician (PhC)/ Clinical Pharmacy Specialist

## 2013-03-04 MED ORDER — WARFARIN SODIUM 2 MG PO TABS: 2.0000 mg | ORAL_TABLET | Freq: Every day | ORAL | Status: DC

## 2013-03-04 NOTE — Telephone Encounter (Signed)
[name] is wanting to find out Dylan Church diagnoses , any medications that he is on ,or any restrictions. Need to show that he is capable of taking care of his 4year old great grandson .Marland Kitchen Please Call   Thanks

## 2013-03-04 NOTE — Telephone Encounter (Signed)
Call to pt at home and busy.   Call to mobile and left a message.  Call back to home and male answering asked RN to call back in 10 mins b/c pt outside.  Informed RN will call back.

## 2013-03-04 NOTE — Telephone Encounter (Signed)
rx mailed 7.7.14

## 2013-03-04 NOTE — Telephone Encounter (Signed)
Returned call.  Left message to call back before 4pm and that a ROI would be needed to process her request.

## 2013-03-04 NOTE — Telephone Encounter (Signed)
Returned call and spoke w/ pt.  Informed of call from [facility] and unable to release information w/o written consent.  Pt stated he will be in tomorrow morning to sign a ROI form for the information to be given to the social worker.  Stated his great grandson stays w/ him and he has no reason he cannot take care of him.  Pt has not seen Dr. Tresa Endo since 2011.  Last OV was w/ Dr. Herbie Baltimore in May 2014.  Pt stated he is to f/u w/ Dr. Herbie Baltimore, not Dr. Tresa Endo.  Message forwarded to Dr. Herbie Baltimore for review.  Paper chart# [id number] on cart.

## 2013-03-04 NOTE — Telephone Encounter (Signed)
I am not sure exactly what is needed.  He cam in with a NSTEMI --> had multivessel CAD & stayed for CABG.  Post-op cabg, he had an accidental OD on pain meds/muscle relaxants & ended up with Bilateral PEs; next complication was due to supratherapeutic INR -- hemorrhagic pericardial effusion --> treated with a pericardial window.    All of that info should be in his D/c summaries & my note from May (on Epic).  Marykay Lex, MD

## 2013-03-04 NOTE — Telephone Encounter (Signed)
Pt called to let you know to mail his Rx to:  Attn: Dr. Herminio Heads Department of [facility] [address] [place], [place] [address]  [phone]

## 2013-03-04 NOTE — Telephone Encounter (Signed)
[name] called back.  Stated she did obtain a ROI from pt last week and just faxed it over.  Confirmation received that fax sent.  Informed Dr. Lars Masson have been notified to review chart and process.  Verbalized understanding.    Call to pt and line busy x 2.  Will try to call back so pt doesn't have to come in.    Call back and spoke w/ Dylan Church.  Agreed to inform pt he does not need to come in as ROI has been received.

## 2013-03-08 ENCOUNTER — Telehealth: Payer: Self-pay | Admitting: Cardiology

## 2013-03-08 NOTE — Telephone Encounter (Signed)
Message forwarded to S. Martin, RN.  

## 2013-03-08 NOTE — Telephone Encounter (Signed)
Informed patient ,that Dr Herbie Baltimore stated to send last office note which will be done today. Otherwise he could not comment on the situation. Patient states his feeling fine and that he is outdoors with his grandson all the time.

## 2013-03-08 NOTE — Telephone Encounter (Signed)
Left message that we can send the last office note but there is know fax number awaiting her return call . Answer machine states she will be in on Mon 7/14

## 2013-03-08 NOTE — Telephone Encounter (Signed)
Pt is calling to check on some paperwork that DSS sent over a couple of weeks ago. Please call pt back regarding the status

## 2013-03-13 NOTE — Telephone Encounter (Signed)
Left on answer machine to call back concerning sending  Dylan Church last office note visit

## 2013-03-14 ENCOUNTER — Ambulatory Visit (INDEPENDENT_AMBULATORY_CARE_PROVIDER_SITE_OTHER): Payer: Medicare Other | Admitting: Pharmacist Clinician (PhC)/ Clinical Pharmacy Specialist

## 2013-03-14 VITALS — BP 110/66 | HR 68

## 2013-03-14 DIAGNOSIS — Z7901 Long term (current) use of anticoagulants: Secondary | ICD-10-CM

## 2013-03-14 DIAGNOSIS — I2699 Other pulmonary embolism without acute cor pulmonale: Secondary | ICD-10-CM

## 2013-03-14 LAB — POCT INR: INR: 1.4

## 2013-03-14 MED ORDER — WARFARIN SODIUM 2 MG PO TABS: 2.0000 mg | ORAL_TABLET | Freq: Every day | ORAL | Status: DC

## 2013-03-28 ENCOUNTER — Ambulatory Visit (INDEPENDENT_AMBULATORY_CARE_PROVIDER_SITE_OTHER): Payer: Medicare Other | Admitting: Pharmacist Clinician (PhC)/ Clinical Pharmacy Specialist

## 2013-03-28 DIAGNOSIS — I2699 Other pulmonary embolism without acute cor pulmonale: Secondary | ICD-10-CM

## 2013-03-28 DIAGNOSIS — Z7901 Long term (current) use of anticoagulants: Secondary | ICD-10-CM

## 2013-03-28 LAB — POCT INR: INR: 1.9

## 2013-04-04 ENCOUNTER — Telehealth: Payer: Self-pay | Admitting: Cardiology

## 2013-04-04 NOTE — Telephone Encounter (Signed)
Faxed information.

## 2013-04-04 NOTE — Telephone Encounter (Signed)
[name] [employer] [phone] ([phone]). She stated that you knew what she was talking about.

## 2013-04-04 NOTE — Telephone Encounter (Signed)
Called. Unable to talk with Jake Seats Left message.  Called back to [place] [facility]  For fax number. It was given. Faxed Mr Gentzler last note to [name] . Per Dr Herbie Baltimore  Unable to comment other than last office note.

## 2013-04-04 NOTE — Telephone Encounter (Deleted)
Faxed information.

## 2013-04-11 ENCOUNTER — Ambulatory Visit (INDEPENDENT_AMBULATORY_CARE_PROVIDER_SITE_OTHER): Payer: Medicare Other | Admitting: Pharmacist Clinician (PhC)/ Clinical Pharmacy Specialist

## 2013-04-11 VITALS — BP 126/68 | HR 88

## 2013-04-11 DIAGNOSIS — Z7901 Long term (current) use of anticoagulants: Secondary | ICD-10-CM

## 2013-04-11 DIAGNOSIS — I2699 Other pulmonary embolism without acute cor pulmonale: Secondary | ICD-10-CM

## 2013-04-11 LAB — POCT INR: INR: 1.6

## 2013-04-18 ENCOUNTER — Other Ambulatory Visit: Payer: Self-pay | Admitting: Pharmacist Clinician (PhC)/ Clinical Pharmacy Specialist

## 2013-04-18 DIAGNOSIS — I82409 Acute embolism and thrombosis of unspecified deep veins of unspecified lower extremity: Secondary | ICD-10-CM

## 2013-04-25 ENCOUNTER — Ambulatory Visit: Payer: Medicare Other | Admitting: Pharmacist Clinician (PhC)/ Clinical Pharmacy Specialist

## 2013-04-25 ENCOUNTER — Encounter (HOSPITAL_COMMUNITY): Payer: Medicare Other

## 2013-04-30 ENCOUNTER — Encounter (HOSPITAL_COMMUNITY): Payer: Medicare Other

## 2013-05-01 ENCOUNTER — Ambulatory Visit (HOSPITAL_COMMUNITY)
Admission: RE | Admit: 2013-05-01 | Discharge: 2013-05-01 | Disposition: A | Discharge status: Home / Self Care | Payer: Medicare Other | Source: Ambulatory Visit | Attending: Cardiovascular Disease | Admitting: Cardiovascular Disease

## 2013-05-01 ENCOUNTER — Ambulatory Visit (INDEPENDENT_AMBULATORY_CARE_PROVIDER_SITE_OTHER): Payer: Medicare Other | Admitting: Pharmacist Clinician (PhC)/ Clinical Pharmacy Specialist

## 2013-05-01 VITALS — BP 100/56 | HR 68

## 2013-05-01 DIAGNOSIS — I82409 Acute embolism and thrombosis of unspecified deep veins of unspecified lower extremity: Secondary | ICD-10-CM | POA: Insufficient documentation

## 2013-05-01 DIAGNOSIS — I2699 Other pulmonary embolism without acute cor pulmonale: Secondary | ICD-10-CM

## 2013-05-01 DIAGNOSIS — Z7901 Long term (current) use of anticoagulants: Secondary | ICD-10-CM

## 2013-05-01 HISTORY — PX: OTHER SURGICAL HISTORY: SHX169

## 2013-05-01 NOTE — Progress Notes (Signed)
Lower Extremity Venous Duplex Completed. °Brianna L Mazza,RVT °

## 2013-05-09 ENCOUNTER — Ambulatory Visit (INDEPENDENT_AMBULATORY_CARE_PROVIDER_SITE_OTHER): Payer: Medicare Other | Admitting: Cardiology

## 2013-05-09 ENCOUNTER — Encounter: Payer: Self-pay | Admitting: Cardiology

## 2013-05-09 ENCOUNTER — Ambulatory Visit (INDEPENDENT_AMBULATORY_CARE_PROVIDER_SITE_OTHER): Payer: Medicare Other | Admitting: Pharmacist Clinician (PhC)/ Clinical Pharmacy Specialist

## 2013-05-09 VITALS — BP 136/68 | HR 60 | Ht 70.0 in | Wt 202.2 lb

## 2013-05-09 DIAGNOSIS — I319 Disease of pericardium, unspecified: Secondary | ICD-10-CM

## 2013-05-09 DIAGNOSIS — I10 Essential (primary) hypertension: Secondary | ICD-10-CM

## 2013-05-09 DIAGNOSIS — I2699 Other pulmonary embolism without acute cor pulmonale: Secondary | ICD-10-CM

## 2013-05-09 DIAGNOSIS — Z720 Tobacco use: Secondary | ICD-10-CM

## 2013-05-09 DIAGNOSIS — I5189 Other ill-defined heart diseases: Secondary | ICD-10-CM

## 2013-05-09 DIAGNOSIS — I519 Heart disease, unspecified: Secondary | ICD-10-CM

## 2013-05-09 DIAGNOSIS — E669 Obesity, unspecified: Secondary | ICD-10-CM

## 2013-05-09 DIAGNOSIS — F17200 Nicotine dependence, unspecified, uncomplicated: Secondary | ICD-10-CM

## 2013-05-09 DIAGNOSIS — I313 Pericardial effusion (noninflammatory): Secondary | ICD-10-CM

## 2013-05-09 DIAGNOSIS — E785 Hyperlipidemia, unspecified: Secondary | ICD-10-CM

## 2013-05-09 DIAGNOSIS — I2510 Atherosclerotic heart disease of native coronary artery without angina pectoris: Secondary | ICD-10-CM

## 2013-05-09 DIAGNOSIS — Z7901 Long term (current) use of anticoagulants: Secondary | ICD-10-CM

## 2013-05-09 MED ORDER — LISINOPRIL 2.5 MG PO TABS: 2.5000 mg | ORAL_TABLET | Freq: Every day | ORAL | Status: AC

## 2013-05-09 NOTE — Patient Instructions (Addendum)
You seem to be doing well.  Sorry to hear about your dog - I know that it has upset you.  With your heart disease & Diabetes, I want to add a new Blood Pressure Medicine called Lisinopril (starting @ 2.5 mg daily - at night)  I want you to continue taking Warfarin (Coumadin) as you are until I see you back in ~ 6 months.  I would like to recheck your cholesterol levels (just come to the lab on day your come in to see Belenda Cruise for you INR check)  Evelynne Spiers W, MD  Your physician wants you to follow-up in: 6 You will receive a reminder letter in the mail two months in advance. If you don't receive a letter, please call our office to schedule the follow-up appointment. 6 months

## 2013-05-09 NOTE — Progress Notes (Signed)
Patient ID: Dylan Church, male   DOB: May 13, 1951, 62 y.o.   MRN: [id number]  PCP: Pcp Not In System  Clinic Note: Chief Complaint  Patient presents with  . ROV 3 months    No complaints   HPI: Dylan Church is a 62 y.o. male with a PMH below who presents today for six-month followup. Very pleasant [place] [employer] who was a former patient of Dr. Lavonne Chick who had his initial cardiac catheterization and PTCA back in 1991. In 2001 he had a stent placed in the RCA which was treated with Cutting Balloon PTCA in June 2002. He then a Taxus DES  3.0 mm 16 mm placed in the distal Circumflex.  He then did relatively well until he was seen at the [facility] in the fall of 2013 4 worsening anginal symptoms. He was evaluated with aLexiScan Myoview the [facility] system which showed a moderate reversible defect in the inferior wall.  However He Was Not Scheduled to See the interventional cardiologist at the [facility] until May of 2014. However he presented to [facility] in early March with progressively unstable angina the end of ruling in for a non-STEMI.  He was taken urgently to the cardiac catheterization lab that day and was found to have significant multivessel disease. Coronary artery bypass grafting was recommended. His initial recovery from damage we will, however he had a complicated post-op course: When he first went home he was CABG, he was doing well but somehow had an unintentional overdose of Flexeril and was found down the floor by his family members. He was brought in her son have a DVT and PE. He subsequent was started on warfarin was discharged on warfarin plus Lovenox. He then came in with a hemorrhagic pericardial effusion requiring pericardial window.   I saw him back in May, and he was doing quite well.he had followup lower extremity venous Dopplers on September 3, which showed no residual thrombus.  Interval History: since his last visit, he is doing pretty well. He walks every  day he is in place the drink other day. Sometimes he goes out one or 2 times a day to walk.  He totally got into the diet concept from the hospital meals. He says his been trying to adopt a diet that he was eating well in the hospital.He definitely wants to try lose weight, but despite that has actually been gaining weight from his postop weight probably consistently eating better and is not as sick. With all is walking and exercising, he denies any chest tightness or pressure with pressor exertion. No PND, orthopnea or edema. No palpitations or rapid heartbeats. Denies any ssyncope or near syncope, TIA or amaurosis fugax symptoms. No active claudication symptoms. He also denies any melena, hematochezia or hematuria.  Past Medical History  Diagnosis Date  . Hypertension   . Myocardial infarction 12/11/2012    Echo done, see procedures  . Diabetes mellitus without complication   . Hyperlipidemia   . CAD (coronary artery disease), native coronary artery; h/o BMS with redo PTCA (in 2001) of prox RCA; and PTCA followed by DES (Taxus 3.0 x 16 mm) to Circumflex (2004) 11/28/2012    2004 Taxus stent to circumflex coronary artery  . Tobacco abuse 11/29/2012    Quit 11/2012  . BACK PAIN, CHRONIC 02/08/2008    Qualifier: Diagnosis of  By: Koleen Distance CMA (AAMA), Hulan Saas    . Depression   . COPD (chronic obstructive pulmonary disease)   .  Shortness of breath   . Pulmonary embolism, bilateral     after being found down -- ? OD on flexeril; on coumadin  . Unstable angina 11/2012    - Multivessel CAD --> CABG  . Pericardial effusion, acute 11/2012    Supratherapeutic INR shortly post-op CABG; s/p Peicardial window.  . H/O echocardiogram 01/08/13:       Normal LV size, moderate concentric LVH with grade 1 diastolic dysfunction. Normal wall motion. EF  70-75%. Aortic sclerosis no stenosis. Trivial posterior pericolic effusion.    Prior Cardiac Evaluation and Past Surgical History: Past Surgical History  Procedure  Laterality Date  . Cardiac catheterization    . Coronary angioplasty  2001    BMS to prox RCC in 1990s - Cutting Balloon PTCA in 2001 (2.75 mm)   . Cholecystectomy    . Percutaneous coronary stent intervention (pci-s)  2004    Taxus DES 3.0 mm and 16 mm.  . Coronary artery bypass graft N/A 12/03/2012    Procedure: CORONARY ARTERY BYPASS GRAFTING (CABG);  Surgeon: Kerin Perna, MD;  Location: [facility] OR;  Service: Open Heart Surgery;  Laterality: N/A;  . Intraoperative transesophageal echocardiogram N/A 12/03/2012    Procedure: INTRAOPERATIVE TRANSESOPHAGEAL ECHOCARDIOGRAM;  Surgeon: Kerin Perna, MD;  Location: [facility] OR;  Service: Open Heart Surgery;  Laterality: N/A;  . Knee surgery  1984  . Subxyphoid pericardial window N/A 12/19/2012    Procedure: SUBXYPHOID PERICARDIAL WINDOW;  Surgeon: Kerin Perna, MD;  Location: [facility] OR;  Service: Thoracic;  Laterality: N/A;  . Transthoracic echocardiogram  12/29/2012    Moderate concentric LVH, ES roughly 75% with grade 1 diastolic dysfunction and elevated pressures. Aortic sclerosis. Mild RV dilation.  . Lower extremity venous dopplers  05/01/2013    No residual thrombus or thrombophlebitis    No Known Allergies  Current Outpatient Prescriptions  Medication Sig Dispense Refill  . aspirin EC 81 MG EC tablet Take 1 tablet (81 mg total) by mouth daily.  30 tablet  2  . atorvastatin (LIPITOR) 40 MG tablet Take 20 mg by mouth at bedtime.      . budesonide-formoterol (SYMBICORT) 160-4.5 MCG/ACT inhaler Inhale 2 puffs into the lungs 2 (two) times daily.  1 Inhaler  1  . cyclobenzaprine (FLEXERIL) 10 MG tablet Take 10 mg by mouth 3 (three) times daily as needed for muscle spasms. For mucle spasms      . digoxin (LANOXIN) 0.25 MG tablet Take 1 tablet (0.25 mg total) by mouth daily.  30 tablet  1  . ferrous fumarate-b12-vitamic C-folic acid (TRINSICON / FOLTRIN) capsule Take 1 capsule by mouth 3 (three) times daily after meals.  90 capsule  1  . furosemide  (LASIX) 40 MG tablet Take 1 tablet (40 mg total) by mouth 2 (two) times daily.  60 tablet  1  . gabapentin (NEURONTIN) 800 MG tablet Take 800 mg by mouth 3 (three) times daily.      . insulin aspart (NOVOLOG) 100 UNIT/ML injection Inject 15 Units into the skin 3 (three) times daily before meals.      . insulin detemir (LEVEMIR) 100 UNIT/ML injection Inject 40-60 Units into the skin 2 (two) times daily. Takes 60 units in the am & 40 units in the pm      . levalbuterol (XOPENEX HFA) 45 MCG/ACT inhaler Inhale 2 puffs into the lungs every 6 (six) hours.  1 Inhaler  1  . metoprolol tartrate (LOPRESSOR) 50 MG tablet Take 1 tablet (50 mg  total) by mouth 2 (two) times daily.  90 tablet  1  . potassium chloride 20 MEQ TBCR Take 20 mEq by mouth 2 (two) times daily.  60 tablet  1  . sertraline (ZOLOFT) 100 MG tablet Take 50 mg by mouth daily.      Marland Kitchen warfarin (COUMADIN) 2 MG tablet Take 1 tablet (2 mg total) by mouth daily. Or as directed by Dr. Landry Dyke office  90 tablet  1  . lisinopril (PRINIVIL,ZESTRIL) 2.5 MG tablet Take 1 tablet (2.5 mg total) by mouth daily.  90 tablet  3   No current facility-administered medications for this visit.    History   Social History Narrative   He is a married. He is actually trying to see if he get custody of her grandson.   reports that he has been smoking.  He does not have any smokeless tobacco history on file. He reports that he does not drink alcohol. His drug history is not on file.   Lives at home.   Can do ADLs, actively walks.         ROS: A comprehensive Review of Systems - Negative except He's quite depressed right now. He is sore along his favorite dog does get killed by a car. His very depressed. MRI tapping his cat got him back to smoking an occasional cigarette here and there.  PHYSICAL EXAM BP 136/68  Pulse 60  Ht 5\' 10"  (1.778 m)  Wt 202 lb 3.2 oz (91.717 kg)  BMI 29.01 kg/m2 General appearance: A&Ox3, cooperative, appears stated age, no  distress, mildly obese and Pleasant mood and affect.  Neck: no adenopathy, no carotid bruit, no JVD, supple, symmetrical, trachea midline, thyroid not enlarged, symmetric, no tenderness/mass/nodules and Head examination shows no wound/surgical scar on the top of the scalp.  Lungs: rhonchi bilaterally and Almost probable tibial sounding in nature. No real wheezing this is all respiratory and Nonlabored, clear to percussion; prolonged expiration phase.  Heart: regular rate and rhythm, S1, S2 normal, no S3 or S4, systolic murmur: systolic ejection 1/6, crescendo and decrescendo at 2nd right intercostal space, no rub and Nondisplaced PMI. No heaves or thrills.  Abdomen: soft, non-tender; bowel sounds normal; no masses, no organomegaly and Mild truncal obesity  Extremities: edema Trace to 1+ on the right, 2+ on left there is also more erythematous. This is the leg where the vein was removed and venous stasis dermatitis noted  Pulses: 2+ and symmetric  Skin: Venous stasis dermatitis as noted above  Neurologic: Grossly normal  FAO:ZHYQMVHQI today: No  Recent Labs: last checked in April. Please see results  ASSESSMENT / PLAN: CAD h/o PCI,  progression 11/29/12- CABG - X3 12/03/12 EDP is doing markedly well. Progressing well and exercising without symptoms. Tolerated the increased dose of beta blocker, will start lisinopril 2.5 mg daily  Pulmonary embolism post CABG No visual thrombus on motion he Dopplers. The plan will be to run anticoagulation for total of 9-12 months. After then I'll probably put on Plavix.  Pericardial effusion - post CABG with supratherapeutic INR for PE, s/p Pericardial window; minimal residual on Echo Followed up with echocardiogram has shown. Minimal residual sign of fluid. No signs of pericarditis symptoms.  HYPERLIPIDEMIA On good dose of atorvastatin. I will then check lipids and LFTs in about a month.  Tobacco abuse He seemed almost she was affected he was hoping that  he'll see her here near. I think a lot of this is he will need to have  persistent positive pressure. He's thought about any cigarettes or Nicoderm patch. He really quite ready to quit.  Diastolic dysfunction grade 1 with an EF 60-65% 2D 11/30/12 No related symptoms. Would be important if the blood pressure well controlled and keep volume now with Lasix.  Obesity, unspecified He is doing quite well without trying to figure this out. He is doing the exercising and he supposedly is working on his diet he has been together reaching to the, never goes away.   Orders Placed This Encounter  Procedures  . Lipid Profile  . Comp Met (CMET)   Meds ordered this encounter  Medications  . lisinopril (PRINIVIL,ZESTRIL) 2.5 MG tablet    Sig: Take 1 tablet (2.5 mg total) by mouth daily.    Dispense:  90 tablet    Refill:  3   Followup: 6 months  Alex Mcmanigal W. Herbie Baltimore, M.D., M.S. [facility] [address] [place], [place]  [address]  [phone] Pager # [phone]

## 2013-05-16 ENCOUNTER — Ambulatory Visit (INDEPENDENT_AMBULATORY_CARE_PROVIDER_SITE_OTHER): Payer: Medicare Other | Admitting: Pharmacist Clinician (PhC)/ Clinical Pharmacy Specialist

## 2013-05-16 DIAGNOSIS — Z7901 Long term (current) use of anticoagulants: Secondary | ICD-10-CM

## 2013-05-16 DIAGNOSIS — I2699 Other pulmonary embolism without acute cor pulmonale: Secondary | ICD-10-CM

## 2013-05-16 LAB — POCT INR: INR: 2.4

## 2013-05-19 ENCOUNTER — Telehealth: Payer: Self-pay | Admitting: *Deleted

## 2013-05-19 NOTE — Telephone Encounter (Signed)
Faxed  PT/INR order sheet back to [facility].

## 2013-05-23 ENCOUNTER — Encounter: Payer: Self-pay | Admitting: Cardiology

## 2013-05-23 NOTE — Assessment & Plan Note (Signed)
No visual thrombus on motion he Dopplers. The plan will be to run anticoagulation for total of 9-12 months. After then I'll probably put on Plavix.

## 2013-05-23 NOTE — Assessment & Plan Note (Signed)
On good dose of atorvastatin. I will then check lipids and LFTs in about a month.

## 2013-05-23 NOTE — Assessment & Plan Note (Signed)
He seemed almost she was affected he was hoping that he'll see her here near. I think a lot of this is he will need to have persistent positive pressure. He's thought about any cigarettes or Nicoderm patch. He really quite ready to quit.

## 2013-05-23 NOTE — Assessment & Plan Note (Signed)
EDP is doing markedly well. Progressing well and exercising without symptoms. Tolerated the increased dose of beta blocker, will start lisinopril 2.5 mg daily

## 2013-05-23 NOTE — Assessment & Plan Note (Signed)
He is doing quite well without trying to figure this out. He is doing the exercising and he supposedly is working on his diet he has been together reaching to the, never goes away.

## 2013-05-23 NOTE — Assessment & Plan Note (Signed)
Followed up with echocardiogram has shown. Minimal residual sign of fluid. No signs of pericarditis symptoms.

## 2013-05-23 NOTE — Assessment & Plan Note (Signed)
No related symptoms. Would be important if the blood pressure well controlled and keep volume now with Lasix.

## 2013-05-30 ENCOUNTER — Ambulatory Visit (INDEPENDENT_AMBULATORY_CARE_PROVIDER_SITE_OTHER): Payer: Medicare Other | Admitting: Pharmacist Clinician (PhC)/ Clinical Pharmacy Specialist

## 2013-05-30 VITALS — BP 98/60 | HR 68

## 2013-05-30 DIAGNOSIS — Z7901 Long term (current) use of anticoagulants: Secondary | ICD-10-CM

## 2013-05-30 DIAGNOSIS — I2699 Other pulmonary embolism without acute cor pulmonale: Secondary | ICD-10-CM

## 2013-05-30 LAB — POCT INR: INR: 1.7

## 2013-06-13 ENCOUNTER — Ambulatory Visit (INDEPENDENT_AMBULATORY_CARE_PROVIDER_SITE_OTHER): Payer: Medicare Other | Admitting: Pharmacist Clinician (PhC)/ Clinical Pharmacy Specialist

## 2013-06-13 VITALS — BP 108/60 | HR 64

## 2013-06-13 DIAGNOSIS — Z7901 Long term (current) use of anticoagulants: Secondary | ICD-10-CM

## 2013-06-13 DIAGNOSIS — I2699 Other pulmonary embolism without acute cor pulmonale: Secondary | ICD-10-CM

## 2013-06-13 LAB — POCT INR: INR: 2.4

## 2013-07-11 ENCOUNTER — Ambulatory Visit (INDEPENDENT_AMBULATORY_CARE_PROVIDER_SITE_OTHER): Payer: Medicare Other | Admitting: Pharmacist Clinician (PhC)/ Clinical Pharmacy Specialist

## 2013-07-11 VITALS — BP 100/52 | HR 64

## 2013-07-11 DIAGNOSIS — I2699 Other pulmonary embolism without acute cor pulmonale: Secondary | ICD-10-CM

## 2013-07-11 DIAGNOSIS — Z7901 Long term (current) use of anticoagulants: Secondary | ICD-10-CM

## 2013-07-24 ENCOUNTER — Ambulatory Visit (INDEPENDENT_AMBULATORY_CARE_PROVIDER_SITE_OTHER): Payer: Medicare Other | Admitting: Pharmacist Clinician (PhC)/ Clinical Pharmacy Specialist

## 2013-07-24 VITALS — BP 100/54 | HR 72

## 2013-07-24 DIAGNOSIS — I2699 Other pulmonary embolism without acute cor pulmonale: Secondary | ICD-10-CM

## 2013-07-24 DIAGNOSIS — Z7901 Long term (current) use of anticoagulants: Secondary | ICD-10-CM

## 2013-08-07 ENCOUNTER — Ambulatory Visit (INDEPENDENT_AMBULATORY_CARE_PROVIDER_SITE_OTHER): Payer: Medicare Other | Admitting: Pharmacist Clinician (PhC)/ Clinical Pharmacy Specialist

## 2013-08-07 VITALS — BP 96/54 | HR 72

## 2013-08-07 DIAGNOSIS — I2699 Other pulmonary embolism without acute cor pulmonale: Secondary | ICD-10-CM

## 2013-08-07 DIAGNOSIS — Z7901 Long term (current) use of anticoagulants: Secondary | ICD-10-CM

## 2013-09-04 ENCOUNTER — Ambulatory Visit (INDEPENDENT_AMBULATORY_CARE_PROVIDER_SITE_OTHER): Payer: Medicare Other | Admitting: Pharmacist Clinician (PhC)/ Clinical Pharmacy Specialist

## 2013-09-04 ENCOUNTER — Other Ambulatory Visit: Payer: Self-pay | Admitting: Pharmacist Clinician (PhC)/ Clinical Pharmacy Specialist

## 2013-09-04 VITALS — BP 108/56 | HR 72

## 2013-09-04 DIAGNOSIS — I2699 Other pulmonary embolism without acute cor pulmonale: Secondary | ICD-10-CM

## 2013-09-04 DIAGNOSIS — Z7901 Long term (current) use of anticoagulants: Secondary | ICD-10-CM

## 2013-09-04 LAB — POCT INR: INR: 2.6

## 2013-09-04 MED ORDER — WARFARIN SODIUM 2 MG PO TABS: ORAL_TABLET | ORAL | Status: DC

## 2013-09-06 ENCOUNTER — Telehealth: Payer: Self-pay | Admitting: Cardiology

## 2013-09-06 NOTE — Telephone Encounter (Signed)
Returned call.  Left message w/ instructions per MD and to call back before 4pm today or Monday between 8am and 4pm.

## 2013-09-06 NOTE — Telephone Encounter (Signed)
Yes it will be fine to hold Warfarin. NO need to bridge.  Marykay LexHARDING,DAVID W, MD

## 2013-09-06 NOTE — Telephone Encounter (Signed)
Phone # [phone] ext [phone]

## 2013-09-06 NOTE — Telephone Encounter (Signed)
Ms Gwinda PasseMichelle Edwards called stated that Mr Dylan Church is having a EGD and colonoscopy on feb 9th and want to know if its ok for patient to hold his warfarin.

## 2013-10-02 ENCOUNTER — Ambulatory Visit (INDEPENDENT_AMBULATORY_CARE_PROVIDER_SITE_OTHER): Payer: Medicare Other | Admitting: Pharmacist Clinician (PhC)/ Clinical Pharmacy Specialist

## 2013-10-02 VITALS — BP 110/54 | HR 76

## 2013-10-02 DIAGNOSIS — I2699 Other pulmonary embolism without acute cor pulmonale: Secondary | ICD-10-CM

## 2013-10-02 DIAGNOSIS — Z7901 Long term (current) use of anticoagulants: Secondary | ICD-10-CM

## 2013-10-02 LAB — POCT INR: INR: 2.1

## 2013-10-30 ENCOUNTER — Ambulatory Visit (INDEPENDENT_AMBULATORY_CARE_PROVIDER_SITE_OTHER): Payer: Medicare Other | Admitting: Pharmacist Clinician (PhC)/ Clinical Pharmacy Specialist

## 2013-10-30 VITALS — BP 126/62 | HR 76

## 2013-10-30 DIAGNOSIS — Z7901 Long term (current) use of anticoagulants: Secondary | ICD-10-CM

## 2013-10-30 DIAGNOSIS — I2699 Other pulmonary embolism without acute cor pulmonale: Secondary | ICD-10-CM

## 2013-10-30 LAB — POCT INR: INR: 1.3

## 2013-11-13 ENCOUNTER — Ambulatory Visit: Payer: Medicare Other | Admitting: Pharmacist Clinician (PhC)/ Clinical Pharmacy Specialist

## 2013-11-15 ENCOUNTER — Ambulatory Visit (INDEPENDENT_AMBULATORY_CARE_PROVIDER_SITE_OTHER): Payer: Medicare Other | Admitting: Pharmacist Clinician (PhC)/ Clinical Pharmacy Specialist

## 2013-11-15 DIAGNOSIS — Z7901 Long term (current) use of anticoagulants: Secondary | ICD-10-CM

## 2013-11-15 DIAGNOSIS — I2699 Other pulmonary embolism without acute cor pulmonale: Secondary | ICD-10-CM

## 2013-11-15 LAB — POCT INR: INR: 1.4

## 2013-11-22 ENCOUNTER — Ambulatory Visit (INDEPENDENT_AMBULATORY_CARE_PROVIDER_SITE_OTHER): Payer: Medicare Other | Admitting: Pharmacist Clinician (PhC)/ Clinical Pharmacy Specialist

## 2013-11-22 DIAGNOSIS — I2699 Other pulmonary embolism without acute cor pulmonale: Secondary | ICD-10-CM

## 2013-11-22 DIAGNOSIS — Z7901 Long term (current) use of anticoagulants: Secondary | ICD-10-CM

## 2013-11-22 LAB — POCT INR: INR: 2.6

## 2013-12-13 ENCOUNTER — Ambulatory Visit (INDEPENDENT_AMBULATORY_CARE_PROVIDER_SITE_OTHER): Payer: Medicare Other | Admitting: Pharmacist Clinician (PhC)/ Clinical Pharmacy Specialist

## 2013-12-13 DIAGNOSIS — I2699 Other pulmonary embolism without acute cor pulmonale: Secondary | ICD-10-CM

## 2013-12-13 DIAGNOSIS — Z7901 Long term (current) use of anticoagulants: Secondary | ICD-10-CM

## 2013-12-13 LAB — POCT INR: INR: 1.6

## 2014-01-03 ENCOUNTER — Ambulatory Visit (INDEPENDENT_AMBULATORY_CARE_PROVIDER_SITE_OTHER): Payer: Medicare Other | Admitting: Pharmacist Clinician (PhC)/ Clinical Pharmacy Specialist

## 2014-01-03 DIAGNOSIS — Z7901 Long term (current) use of anticoagulants: Secondary | ICD-10-CM

## 2014-01-03 DIAGNOSIS — I2699 Other pulmonary embolism without acute cor pulmonale: Secondary | ICD-10-CM

## 2014-01-03 LAB — POCT INR: INR: 1.5

## 2014-01-03 MED ORDER — WARFARIN SODIUM 5 MG PO TABS: 5.0000 mg | ORAL_TABLET | Freq: Every day | ORAL | Status: AC

## 2014-01-24 ENCOUNTER — Ambulatory Visit (INDEPENDENT_AMBULATORY_CARE_PROVIDER_SITE_OTHER): Payer: Medicare Other | Admitting: Pharmacist Clinician (PhC)/ Clinical Pharmacy Specialist

## 2014-01-24 DIAGNOSIS — Z7901 Long term (current) use of anticoagulants: Secondary | ICD-10-CM

## 2014-01-24 DIAGNOSIS — I2699 Other pulmonary embolism without acute cor pulmonale: Secondary | ICD-10-CM

## 2014-01-24 LAB — POCT INR: INR: 2.1

## 2014-02-21 ENCOUNTER — Ambulatory Visit (INDEPENDENT_AMBULATORY_CARE_PROVIDER_SITE_OTHER): Payer: Medicare Other | Admitting: Pharmacist Clinician (PhC)/ Clinical Pharmacy Specialist

## 2014-02-21 DIAGNOSIS — Z7901 Long term (current) use of anticoagulants: Secondary | ICD-10-CM

## 2014-02-21 DIAGNOSIS — I2699 Other pulmonary embolism without acute cor pulmonale: Secondary | ICD-10-CM

## 2014-02-21 LAB — POCT INR: INR: 4.8

## 2014-03-03 ENCOUNTER — Ambulatory Visit (INDEPENDENT_AMBULATORY_CARE_PROVIDER_SITE_OTHER): Payer: Medicare Other | Admitting: Pharmacist

## 2014-03-03 DIAGNOSIS — Z7901 Long term (current) use of anticoagulants: Secondary | ICD-10-CM

## 2014-03-03 DIAGNOSIS — I2699 Other pulmonary embolism without acute cor pulmonale: Secondary | ICD-10-CM

## 2014-03-03 LAB — POCT INR: INR: 1.5

## 2014-03-11 ENCOUNTER — Encounter: Payer: Self-pay | Admitting: Gastroenterology

## 2014-03-17 ENCOUNTER — Ambulatory Visit (INDEPENDENT_AMBULATORY_CARE_PROVIDER_SITE_OTHER): Payer: Medicare Other | Admitting: Pharmacist Clinician (PhC)/ Clinical Pharmacy Specialist

## 2014-03-17 DIAGNOSIS — I2699 Other pulmonary embolism without acute cor pulmonale: Secondary | ICD-10-CM

## 2014-03-17 DIAGNOSIS — Z7901 Long term (current) use of anticoagulants: Secondary | ICD-10-CM

## 2014-03-17 LAB — POCT INR: INR: 5.4

## 2014-03-20 ENCOUNTER — Telehealth: Payer: Self-pay | Admitting: Cardiology

## 2014-03-20 NOTE — Telephone Encounter (Signed)
Received a call from Dr.Hamlet from [facility] she wanted to know why patient is taking Coumadin,after reviewing patient's chart coumadin was prescribed for bilateral pe's.

## 2014-03-24 ENCOUNTER — Ambulatory Visit: Payer: Medicare Other | Admitting: Pharmacist Clinician (PhC)/ Clinical Pharmacy Specialist

## 2014-07-02 IMAGING — CR DG CHEST 2V
2 series · 2 of 2 positions shown · non-contrast
Comparison: 11/28/2012

CLINICAL DATA: Hypertension, back Pain, coronary disease

CHEST - 2 VIEW

[w chest pa]
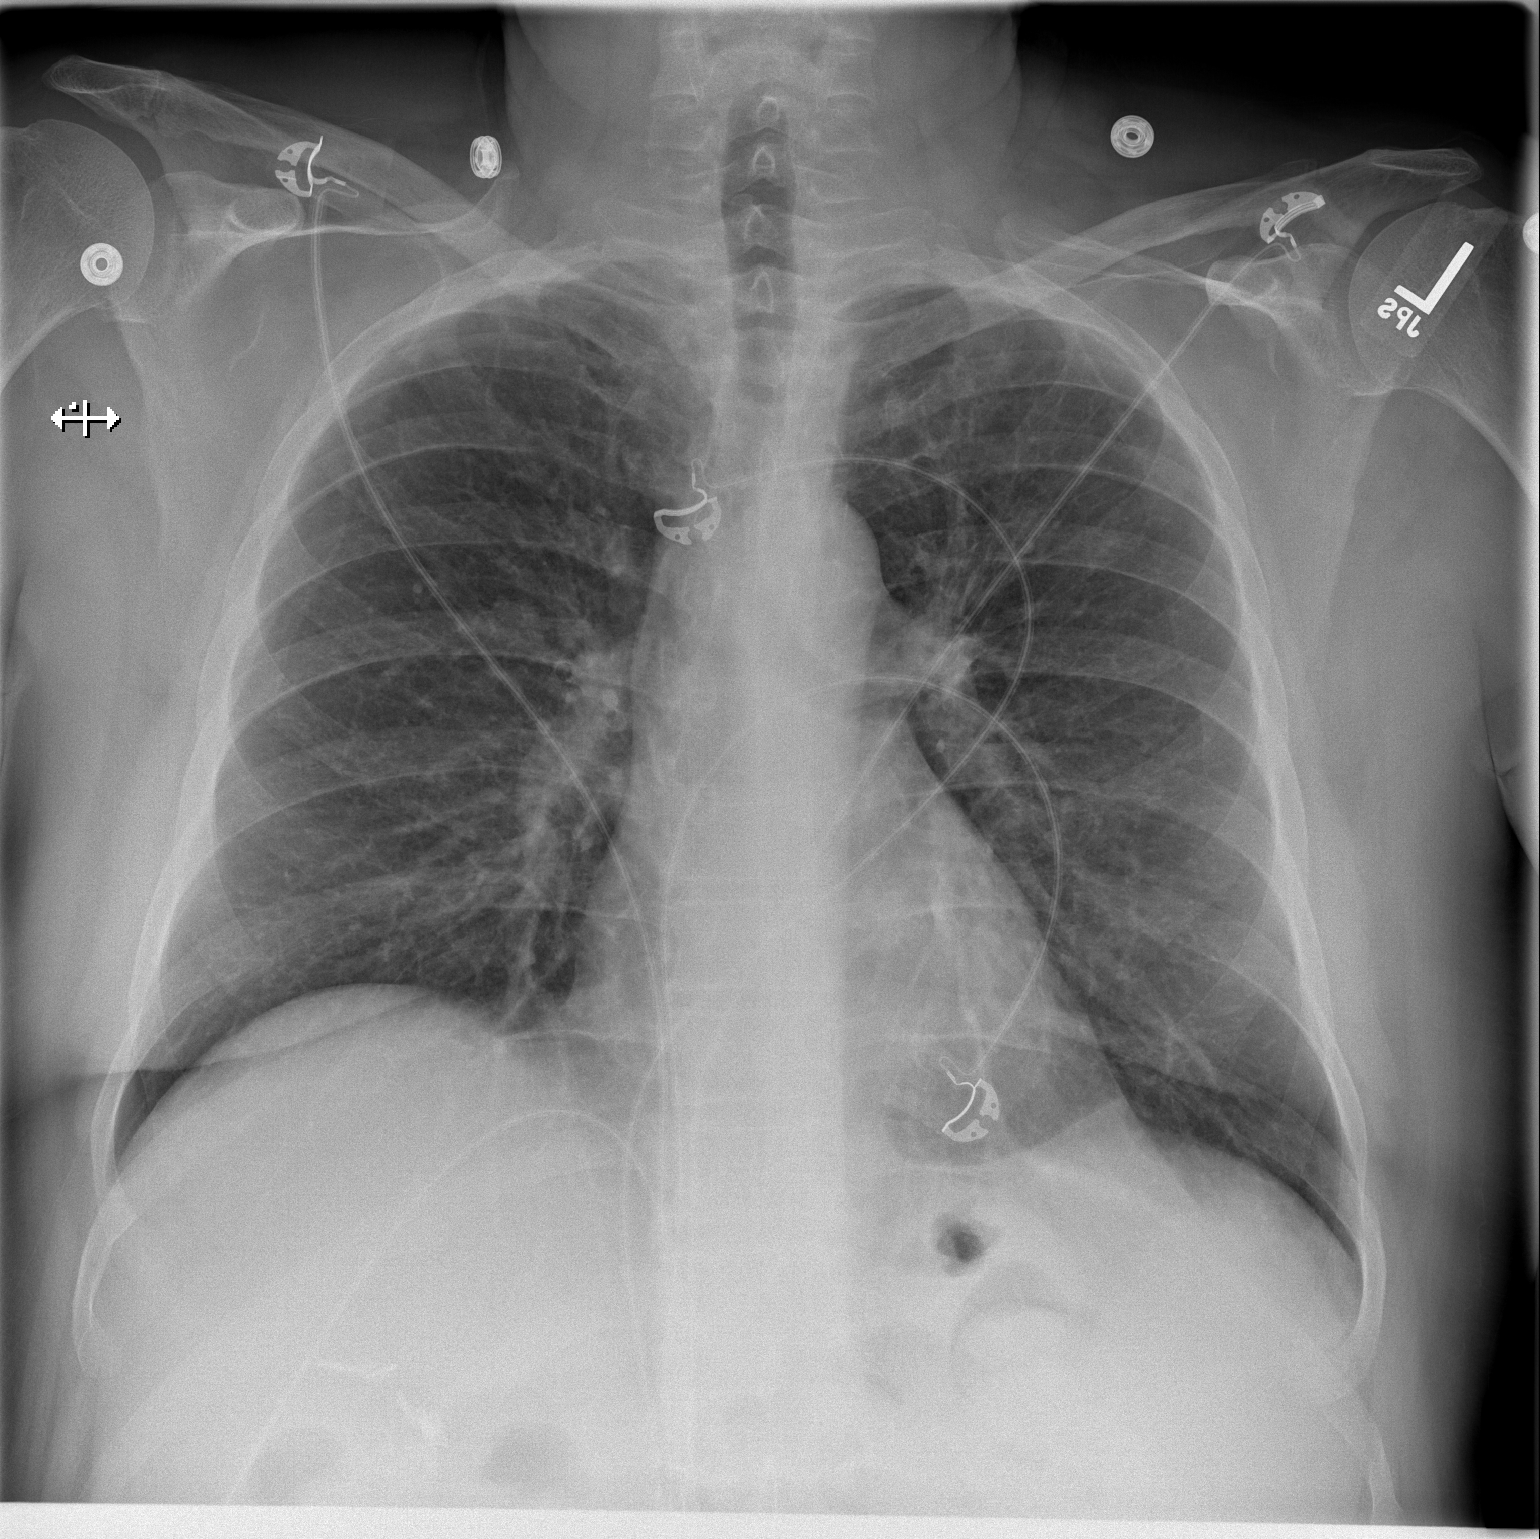

[w chest lat]
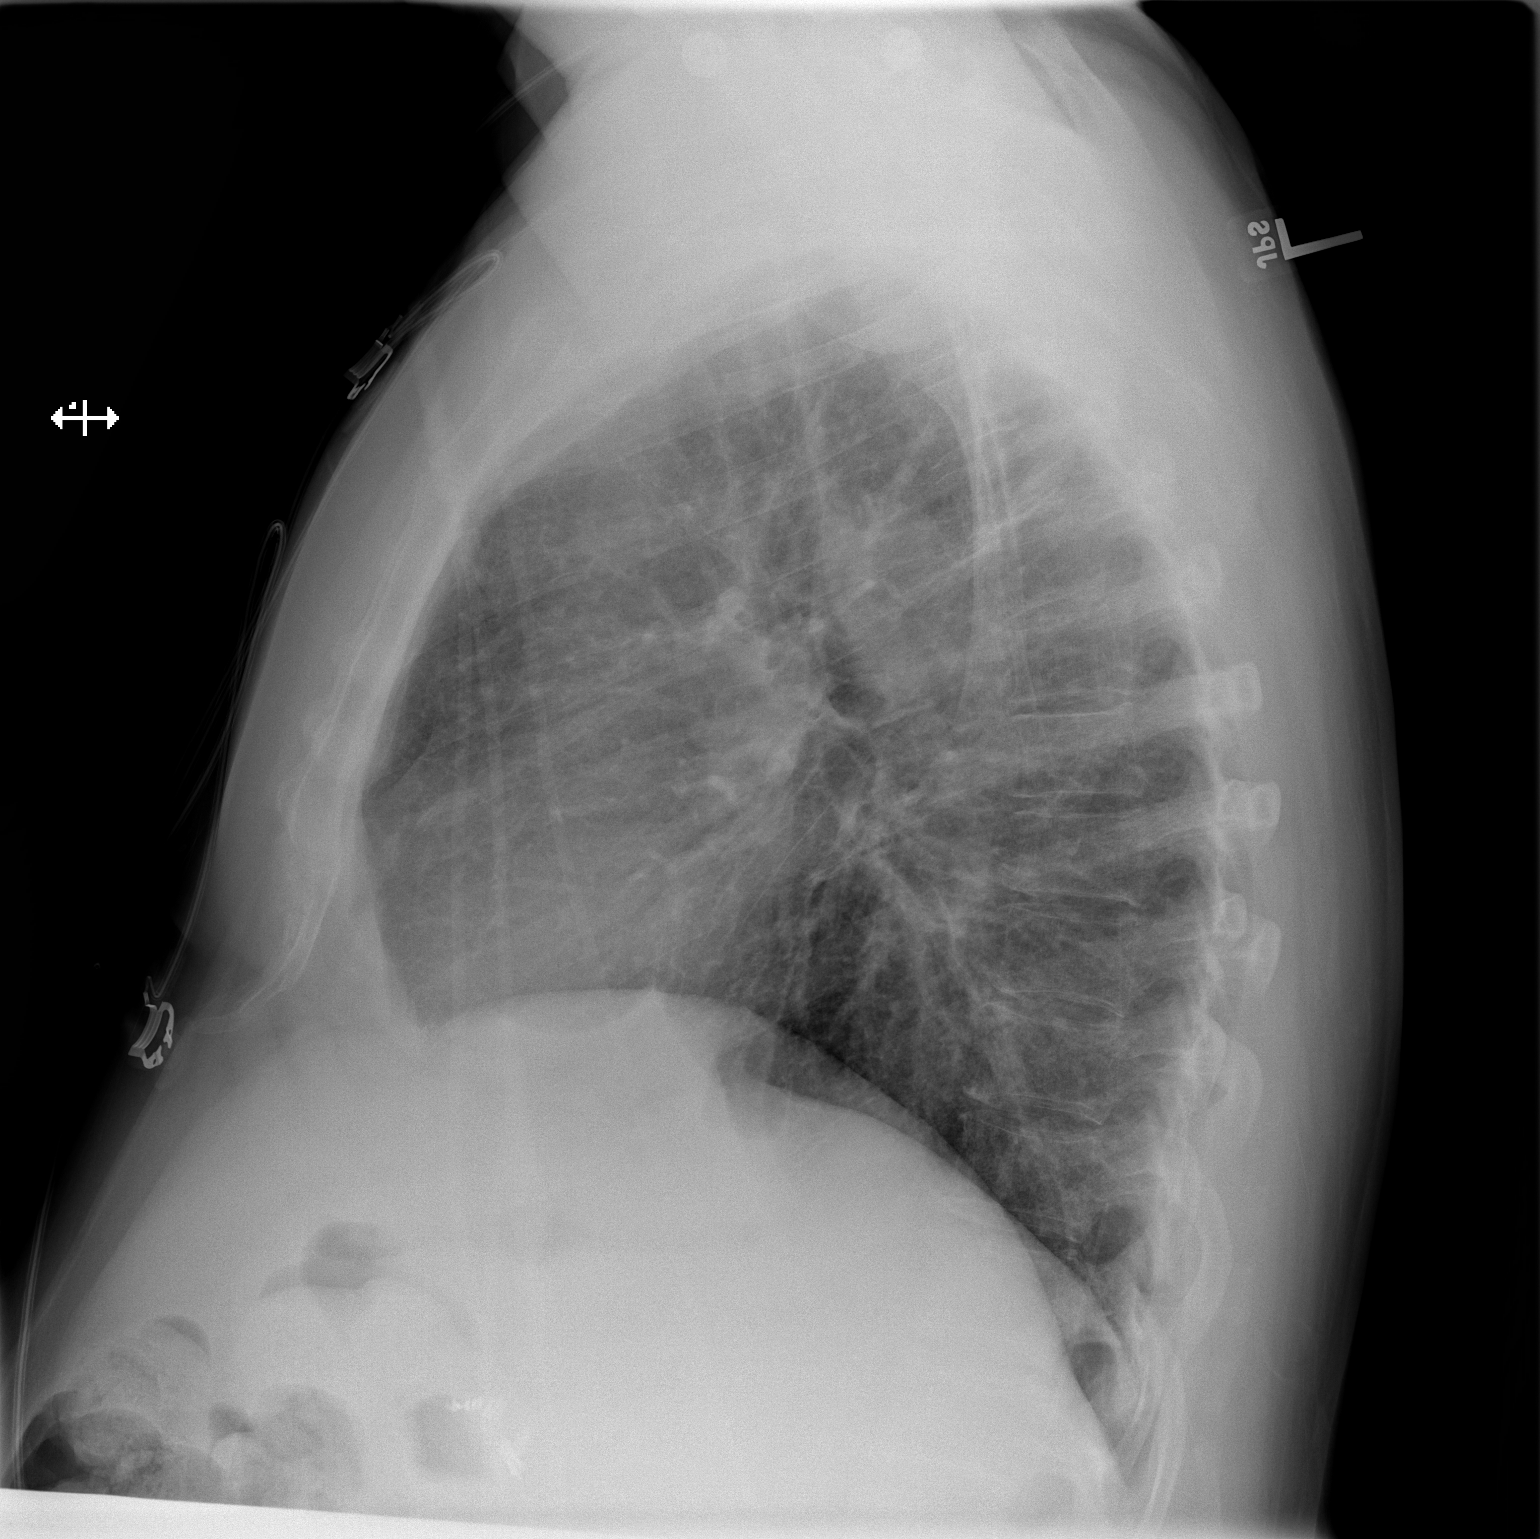

[2 of 2 positions shown; findings below may reference images not displayed]

FINDINGS: The heart and pulmonary vascularity are within normal
limits.  The lungs are clear bilaterally.  No bony abnormality is
seen.
IMPRESSION: No acute abnormality noted.  No change from prior exam.

## 2014-07-05 IMAGING — CR DG CHEST 1V PORT
1 series · 1 of 1 positions shown · non-contrast
Comparison: Preoperative x-ray on 11/30/2012

CLINICAL DATA: Status post CABG.

PORTABLE CHEST - 1 VIEW

[AP]
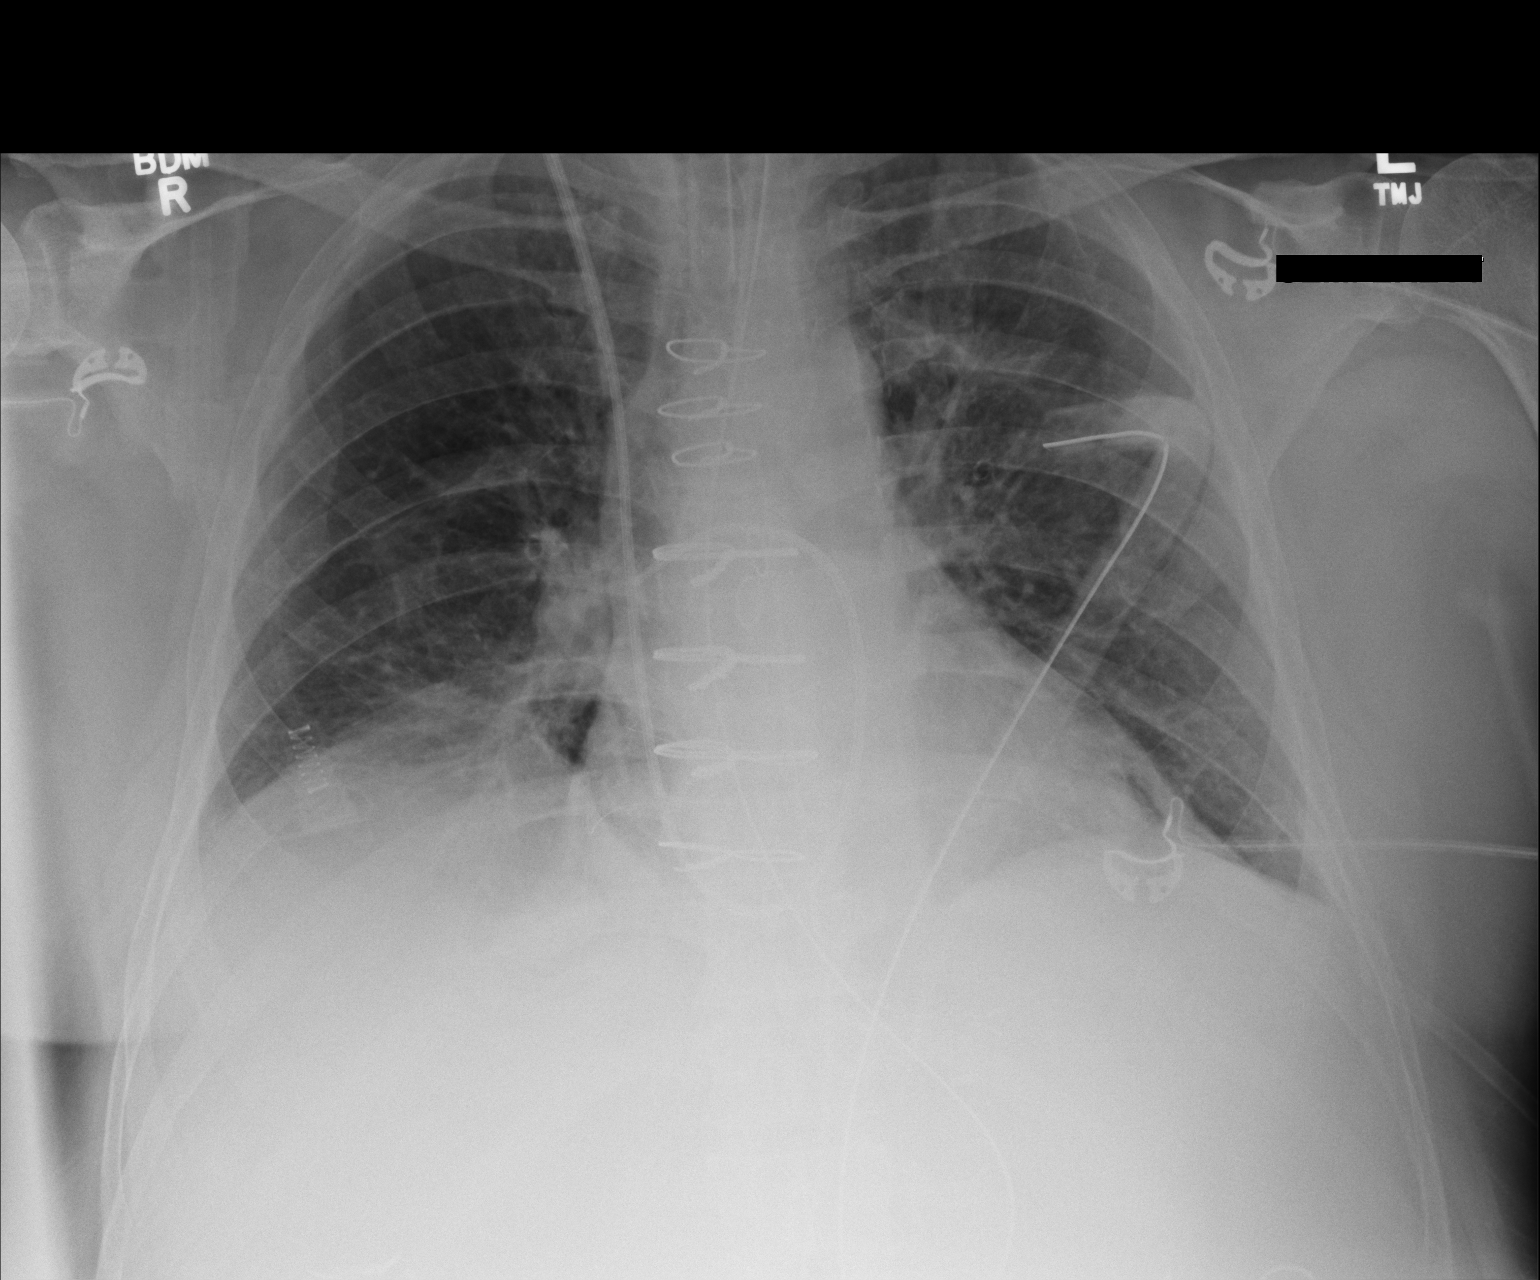

[1 of 1 positions shown; findings below may reference images not displayed]

FINDINGS: Endotracheal tube is present with the tip approximately
3.5 cm above the carina.  Left chest tube is in place with no
pneumothorax identified.  Swan-Ganz catheter tip extends into the
right pulmonary artery.  Lungs show no edema or significant
consolidation.  Mild bilateral lower lobe atelectasis present.  The
heart size and mediastinal contours are stable and normal.
IMPRESSION: No pneumothorax.  Mild bilateral lower lobe atelectasis present.

## 2014-07-08 IMAGING — CR DG CHEST 2V
2 series · 2 of 2 positions shown · non-contrast
Comparison: Portable chest x-ray of 12/05/2012

CLINICAL DATA: Post CABG, chest soreness

CHEST - 2 VIEW

[w chest pa]
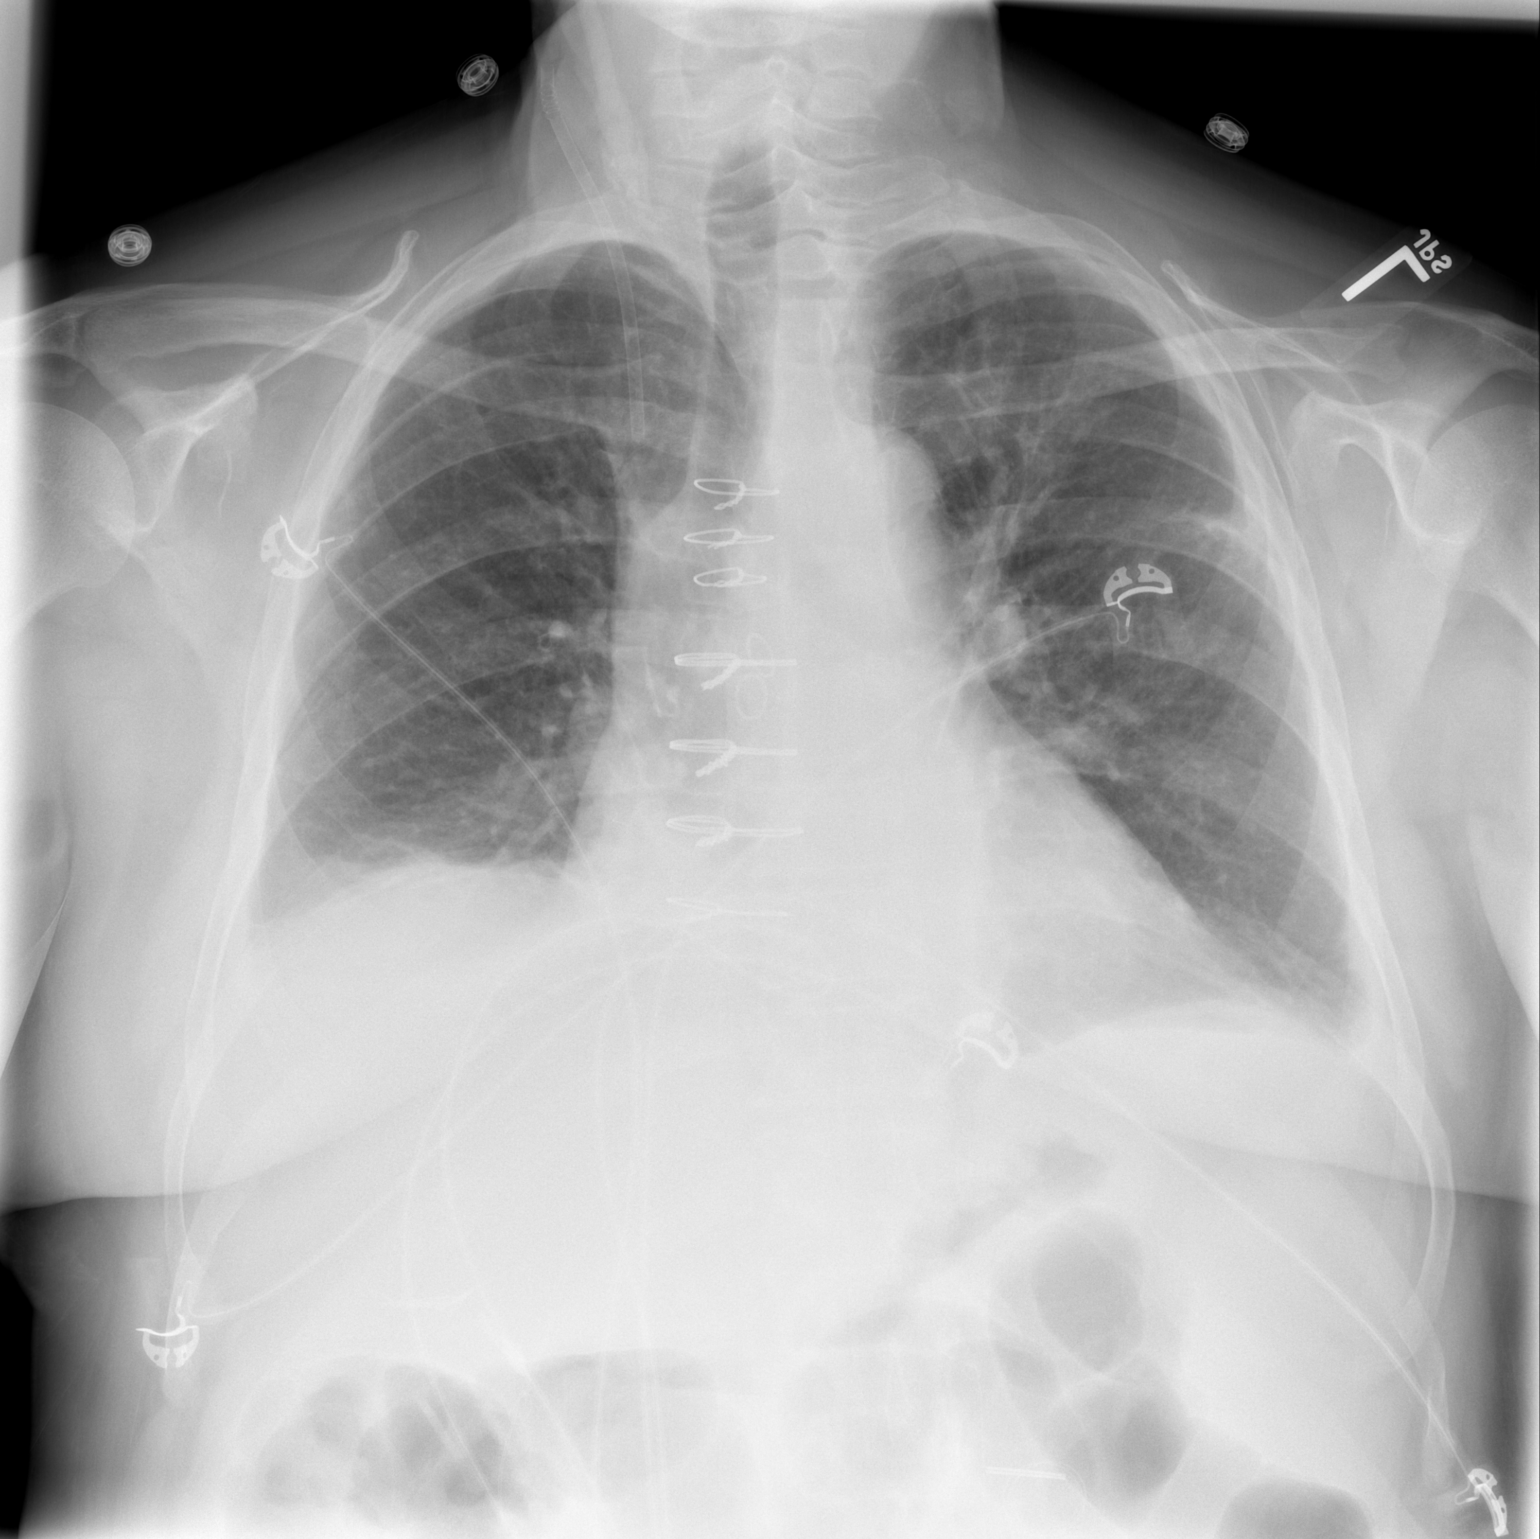

[w chest lat]
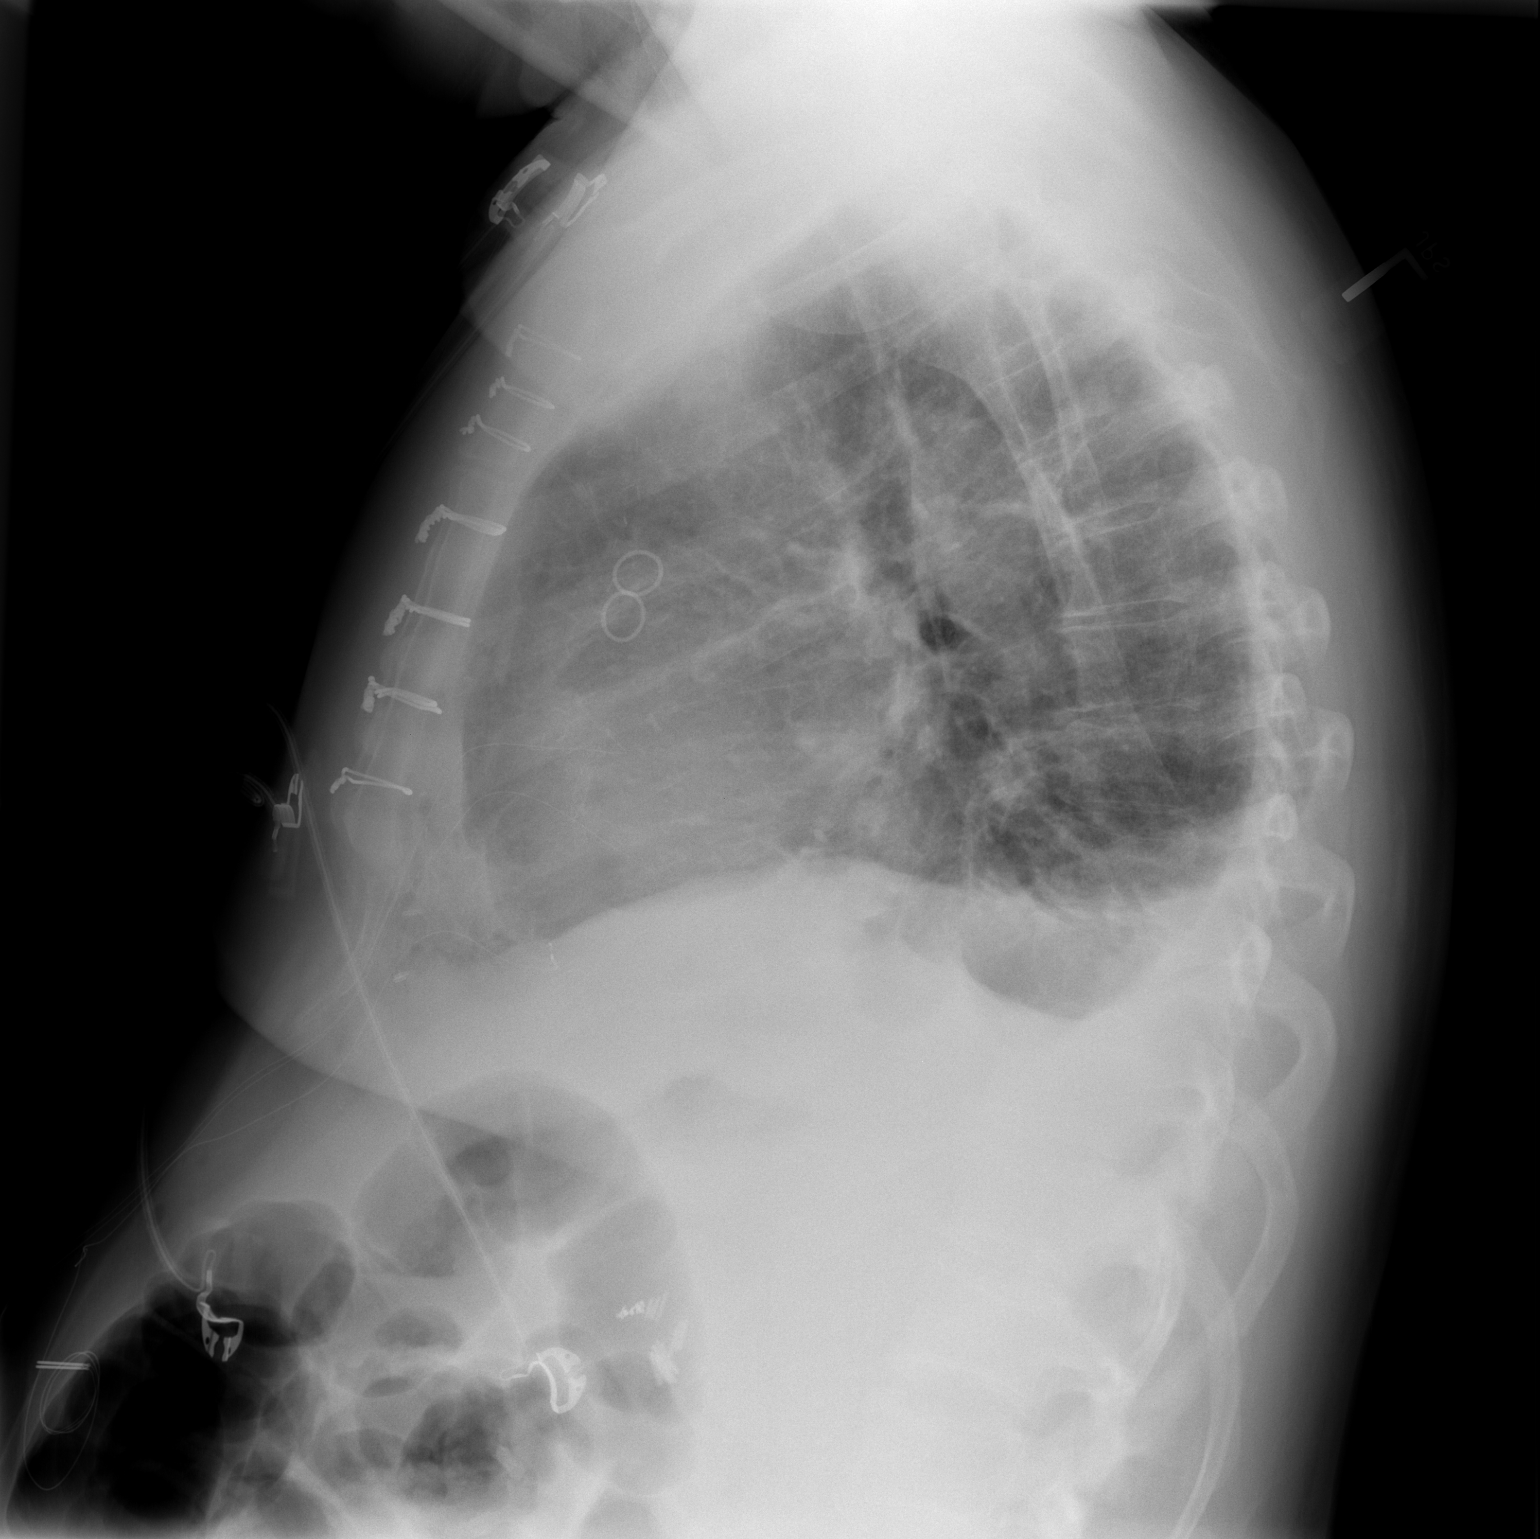

[2 of 2 positions shown; findings below may reference images not displayed]

FINDINGS: The left chest tube has been removed.  No pneumothorax is
seen.  A venous sheath remains in the SVC.  Small effusions remain
and there is mild basilar atelectasis, right greater than left.
IMPRESSION: Left chest tube removed.  No pneumothorax.  Persistent small
effusions with basilar atelectasis.

## 2014-08-07 ENCOUNTER — Encounter (HOSPITAL_COMMUNITY): Payer: Self-pay | Admitting: Cardiology

## 2014-12-19 ENCOUNTER — Encounter: Payer: Self-pay | Admitting: Gastroenterology

## 2015-03-24 ENCOUNTER — Ambulatory Visit: Payer: Self-pay | Admitting: Pharmacist Clinician (PhC)/ Clinical Pharmacy Specialist

## 2015-03-24 DIAGNOSIS — I2699 Other pulmonary embolism without acute cor pulmonale: Secondary | ICD-10-CM

## 2015-09-09 ENCOUNTER — Encounter: Payer: Self-pay | Admitting: Gastroenterology

## 2018-01-27 DEATH — deceased
